# Patient Record
Sex: Female | Born: 1950
Health system: Southern US, Community
[De-identification: ages and names within clinical notes are randomized; demographics above are authoritative.]

## PROBLEM LIST (undated history)

## (undated) DIAGNOSIS — F329 Major depressive disorder, single episode, unspecified: Secondary | ICD-10-CM

## (undated) DIAGNOSIS — F419 Anxiety disorder, unspecified: Secondary | ICD-10-CM

## (undated) DIAGNOSIS — N63 Unspecified lump in unspecified breast: Principal | ICD-10-CM

## (undated) DIAGNOSIS — F32A Depression, unspecified: Secondary | ICD-10-CM

## (undated) DIAGNOSIS — L570 Actinic keratosis: Secondary | ICD-10-CM

## (undated) DIAGNOSIS — K219 Gastro-esophageal reflux disease without esophagitis: Secondary | ICD-10-CM

## (undated) DIAGNOSIS — M8580 Other specified disorders of bone density and structure, unspecified site: Secondary | ICD-10-CM

## (undated) DIAGNOSIS — T7840XA Allergy, unspecified, initial encounter: Secondary | ICD-10-CM

## (undated) DIAGNOSIS — I10 Essential (primary) hypertension: Secondary | ICD-10-CM

## (undated) HISTORY — DX: Other specified disorders of bone density and structure, unspecified site: M85.80

## (undated) HISTORY — DX: Anxiety disorder, unspecified: F41.9

## (undated) HISTORY — PX: OTHER SURGICAL HISTORY: SHX169

## (undated) HISTORY — DX: Actinic keratosis: L57.0

## (undated) HISTORY — DX: Essential (primary) hypertension: I10

## (undated) HISTORY — DX: Depression, unspecified: F32.A

## (undated) HISTORY — DX: Gastro-esophageal reflux disease without esophagitis: K21.9

## (undated) HISTORY — DX: Major depressive disorder, single episode, unspecified: F32.9

## (undated) HISTORY — DX: Allergy, unspecified, initial encounter: T78.40XA

---
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
## 1986-08-18 HISTORY — PX: ABDOMINAL HYSTERECTOMY: SHX81

## 1998-06-27 ENCOUNTER — Other Ambulatory Visit: Admission: RE | Admit: 1998-06-27 | Discharge: 1998-06-27 | Payer: Self-pay | Admitting: Obstetrics and Gynecology

## 1998-09-06 ENCOUNTER — Other Ambulatory Visit: Admission: RE | Admit: 1998-09-06 | Discharge: 1998-09-06 | Payer: Self-pay | Admitting: Obstetrics and Gynecology

## 1999-08-09 ENCOUNTER — Other Ambulatory Visit: Admission: RE | Admit: 1999-08-09 | Discharge: 1999-08-09 | Payer: Self-pay | Admitting: Obstetrics and Gynecology

## 1999-09-24 ENCOUNTER — Encounter (INDEPENDENT_AMBULATORY_CARE_PROVIDER_SITE_OTHER): Payer: Self-pay | Admitting: Specialist

## 1999-09-24 ENCOUNTER — Other Ambulatory Visit: Admission: RE | Admit: 1999-09-24 | Discharge: 1999-09-24 | Payer: Self-pay | Admitting: Obstetrics and Gynecology

## 2000-05-21 ENCOUNTER — Other Ambulatory Visit: Admission: RE | Admit: 2000-05-21 | Discharge: 2000-05-21 | Payer: Self-pay | Admitting: Obstetrics and Gynecology

## 2000-11-12 ENCOUNTER — Other Ambulatory Visit: Admission: RE | Admit: 2000-11-12 | Discharge: 2000-11-12 | Payer: Self-pay | Admitting: Obstetrics and Gynecology

## 2001-06-10 ENCOUNTER — Other Ambulatory Visit: Admission: RE | Admit: 2001-06-10 | Discharge: 2001-06-10 | Payer: Self-pay | Admitting: Obstetrics and Gynecology

## 2001-08-18 HISTORY — PX: CHOLECYSTECTOMY: SHX55

## 2001-11-23 ENCOUNTER — Other Ambulatory Visit: Admission: RE | Admit: 2001-11-23 | Discharge: 2001-11-23 | Payer: Self-pay | Admitting: Obstetrics and Gynecology

## 2002-12-05 ENCOUNTER — Other Ambulatory Visit: Admission: RE | Admit: 2002-12-05 | Discharge: 2002-12-05 | Payer: Self-pay | Admitting: Obstetrics and Gynecology

## 2003-04-28 ENCOUNTER — Ambulatory Visit (HOSPITAL_COMMUNITY): Admission: RE | Admit: 2003-04-28 | Discharge: 2003-04-28 | Payer: Self-pay | Admitting: Internal Medicine

## 2003-04-28 ENCOUNTER — Encounter (INDEPENDENT_AMBULATORY_CARE_PROVIDER_SITE_OTHER): Payer: Self-pay | Admitting: Specialist

## 2003-07-10 ENCOUNTER — Other Ambulatory Visit: Admission: RE | Admit: 2003-07-10 | Discharge: 2003-07-10 | Payer: Self-pay | Admitting: Obstetrics and Gynecology

## 2003-12-28 ENCOUNTER — Other Ambulatory Visit: Admission: RE | Admit: 2003-12-28 | Discharge: 2003-12-28 | Payer: Self-pay | Admitting: Obstetrics and Gynecology

## 2004-06-27 ENCOUNTER — Other Ambulatory Visit: Admission: RE | Admit: 2004-06-27 | Discharge: 2004-06-27 | Payer: Self-pay | Admitting: Obstetrics and Gynecology

## 2004-09-24 ENCOUNTER — Emergency Department: Payer: Self-pay | Admitting: Emergency Medicine

## 2004-10-28 ENCOUNTER — Ambulatory Visit: Payer: Self-pay | Admitting: Internal Medicine

## 2004-10-30 ENCOUNTER — Encounter: Admission: RE | Admit: 2004-10-30 | Discharge: 2004-10-30 | Payer: Self-pay | Admitting: Internal Medicine

## 2005-04-23 ENCOUNTER — Other Ambulatory Visit: Admission: RE | Admit: 2005-04-23 | Discharge: 2005-04-23 | Payer: Self-pay | Admitting: Obstetrics and Gynecology

## 2005-08-18 HISTORY — PX: BLADDER REPAIR: SHX76

## 2005-11-11 ENCOUNTER — Other Ambulatory Visit: Admission: RE | Admit: 2005-11-11 | Discharge: 2005-11-11 | Payer: Self-pay | Admitting: Obstetrics and Gynecology

## 2006-03-05 ENCOUNTER — Ambulatory Visit (HOSPITAL_COMMUNITY): Admission: RE | Admit: 2006-03-05 | Discharge: 2006-03-06 | Payer: Self-pay | Admitting: Obstetrics and Gynecology

## 2009-04-02 ENCOUNTER — Ambulatory Visit: Payer: Self-pay | Admitting: Internal Medicine

## 2009-04-16 ENCOUNTER — Ambulatory Visit: Payer: Self-pay | Admitting: Internal Medicine

## 2010-08-18 HISTORY — PX: KNEE SURGERY: SHX244

## 2011-01-03 NOTE — Op Note (Signed)
   NAMESHEREDA, GRAW                           ACCOUNT NO.:  [id number]   MEDICAL RECORD NO.:  [id number]                   PATIENT TYPE:  AMB   LOCATION:  ENDO                                 FACILITY:  [facility]   PHYSICIAN:  Lina Sar, M.D. [facility]               DATE OF BIRTH:  1950-10-29   DATE OF PROCEDURE:  04/28/2003  DATE OF DISCHARGE:                                 OPERATIVE REPORT   PROCEDURE:  Colonoscopy.   INDICATIONS:  This 60 year old white female was found to have hemoccult  positive stool on exam by Dr. Henderson Cloud.  I saw her in the office on April 25, 2003 and she was at that time hemoccult negative.  She has a positive  family history of colon cancer in an indirect relative.  She has had  irregular bowel habits, mostly constipation.  She has had some unexplained  weight gain.  Her medications have included hormonal replacement and Lexapro  10 mg a day.  She is undergoing colonoscopy for evaluation of hemoccult  positive stool.   ENDOSCOPE:  Olympus single-channel video endoscope.   SEDATION:  Versed 7 mg IV, fentanyl  100 mcg IV.   FINDINGS:  Olympus single-channel videoscope passed into the rectum and  advanced to the sigmoid colon.  The patient was monitored by pulse oximetry.  Her oxygen saturations were normal.  Her prep was excellent.  Anal canal and  rectal ampulla were normal.  At the level of 20 cm from the rectum was  sessile polyp with a wide base, about 1 cm in diameter and it showed no  stigmata of bleeding.  It was snared with a mini snare and sent to pathology  in one piece.  There was no bleeding from post polypectomy site.  The  descending colon, splenic flexure, transverse colon, hepatic flexure  appeared entirely normal.  Normal ascending colon, cecum, cecal pouch,  ileocecal valve were visualized thoroughly and appears normal.  Colonoscope  was then retracted, the colon decompressed. The patient tolerated the  procedure well.   IMPRESSION:   Left colon polyp, status post polypectomy.   PLAN:  1. Await results of the biopsies of polyps.  2. Post polypectomy instructions.  3. Hemoccult cards yearly.  4. Recall colonoscopy depending on the polyp histology, probably in three to     five years.      DB/MEDQ  D:  04/28/2003  T:  04/28/2003  Job:  [id number]   cc:   Guy Sandifer. Arleta Creek, M.D.  [address]  [place]  [place] [address]  Fax: [phone]

## 2011-01-03 NOTE — Discharge Summary (Signed)
[name], [name]               ACCOUNT NO.:  [id number]   MEDICAL RECORD NO.:  [id number]          PATIENT TYPE:  OIB   LOCATION:  9303                          FACILITY:  [facility]   PHYSICIAN:  Guy Sandifer. Henderson Cloud, M.D. DATE OF BIRTH:  [birth year]   DATE OF ADMISSION:  03/05/2006  DATE OF DISCHARGE:                                 DISCHARGE SUMMARY   ADMITTING DIAGNOSIS:  Stress urinary continence and pelvic relaxation.   DISCHARGE DIAGNOSIS:  Stress urinary continence and pelvic relaxation.   PROCEDURE:  On March 05, 2006, anterior/posterior vaginal repair with an  anterior bovine graft and Lynx midurethral sling.   REASON FOR ADMISSION:  This patient is a 60 year old married white female  G2, P2 status post TAH/BSO for endometriosis with increasing symptoms of  relaxation and incontinence.  Details are dictated in the history and  physical.  She is admitted for surgical management.   HOSPITAL COURSE:  The patient is admitted to the hospital, undergoes the  above procedure.  On the evening of surgery she has good pain relief, is  ambulating, without nausea or vomiting.  Vital signs are stable, she is  afebrile, with clear urine output.  On the morning of discharge Foley  catheter has been removed and she avoids 200 mL with a 37 mL residual.  Vital signs are stable, she is afebrile.  Vaginal pack is removed and there  is no bleeding.  Hemoglobin 12.0; white count 16.1; platelets 213,000.  Pathology is pending.   CONDITION ON DISCHARGE:  Good.   DIET:  Regular as tolerated.   ACTIVITY:  No lifting, no operation of automobiles, no vaginal entry.  She  is to call the office for problems including but not limited to heavy  vaginal bleeding, persistent nausea/vomiting, increasing pain, or  temperature of 101 degrees.  Double-voiding is discussed.   MEDICATIONS:  1.  She will resume Lexapro.  2.  Will resume the Vivelle-Dot next week.  3.  Percocet 5/325 mg #40 one to two p.o. q.6h.  p.r.n.  4.  Ibuprofen 600 mg q.6h. p.r.n.   FOLLOWUP:  Is in the office in 2 weeks.     Guy Sandifer Henderson Cloud, M.D.  Electronically Signed    JET/MEDQ  D:  03/06/2006  T:  03/06/2006  Job:  [id number]

## 2011-01-03 NOTE — Op Note (Signed)
[name], [name]               ACCOUNT NO.:  [id number]   MEDICAL RECORD NO.:  [id number]          PATIENT TYPE:  AMB   LOCATION:  SDC                           FACILITY:  [facility]   PHYSICIAN:  Guy Sandifer. Henderson Cloud, M.D. DATE OF BIRTH:  Oct 22, 1950   DATE OF PROCEDURE:  03/05/2006  DATE OF DISCHARGE:                                 OPERATIVE REPORT   PREOPERATIVE DIAGNOSES:  1.  Stress urinary continence  2.  Pelvic relaxation.   POSTOPERATIVE DIAGNOSES:  1.  Stress urinary continence  2.  Pelvic relaxation.   PROCEDURE:  Anterior vaginal repair with Boston scientific graft, posterior  vaginal repair, and Lynx mid urethral sling.   SURGEON:  Harold Hedge, M.D.   ASSISTANT:  Juluis Mire, M.D.   ANESTHESIA:  General with endotracheal intubation.   ESTIMATED BLOOD LOSS:  Less than 100 mL.   INDICATIONS AND CONSENT:  This patient is a 60 year old married white  female, G2, P2, status post TAH-BSO with increasing symptoms of stress  incontinence and pelvic relaxation.  Details have been dictated in the  history and physical.  Anterior-posterior repair with grafts and mid  urethral sling have been discussed preoperatively.  Potential risks and  complications were discussed preoperatively including but limited to  infection, organ damage, bleeding requiring transfusion of blood products  with possible transfusion reaction, HIV and hepatitis acquisition, DVT, PE,  pneumonia, fistula formation, erosion, postoperative pain, dyspareunia,  vaginal narrowing, urinary retention, prolonged or intermittent self-  catheterization and possible return to the OR.  All questions have been  answered and consent was signed on the chart.   PROCEDURE:  The patient is taken to operating room where she is identified,  placed in the dorsosupine position, and general anesthesia is induced via  endotracheal intubation.  She was then placed in dorsal lithotomy position  where she is prepped, bladder  straight catheterized, and she is draped in a  sterile fashion.  Weighted speculum was placed, and the anterior vaginal  mucosa is injected with 1/2% lidocaine with 1:200,000 epinephrine.  Starting  with an inverted T incision at the anterior vaginal apex, the anterior  vaginal mucosa was taken down in the midline to a point approximately 2 cm  below the urethral meatus.  Bilateral dissection is then done sharply and  bluntly.  Zero Ethibond suture was then placed through the uterosacral  ligaments bilaterally proximally 2 fingerbreadths medial to the spine using  the Capio needle driver.  A second suture was placed bilaterally along the  white line using the same Ethibond suture.  Then, a [employer] scientific bovine  graft was trimmed to fit, and the 2 posterior anchoring sutures were placed  on the posterior corners of the graft and it was tied down.  The remaining 2  sutures were placed through the margins of the graft, and those were also  tied down.  This achieved good fixation and support.  Suprapubically, 2  incisions were made just one fingerbreadth lateral to the midline  bilaterally after injecting with 1/2% lidocaine with 1:200,000 epinephrine.  Foley catheter was placed in  the bladder.  Then, using the [employer], the  needles were placed from the top down retropubically bilaterally.  Foley  catheter was then removed, and cystoscopy was carried out.  There was no  evidence of perforation, foreign body bilaterally.  The good puff from the  ureters was noted bilaterally.  The cystoscope was removed, and a Foley  catheter was replaced.  The [place] mid urethral sling was then placed on the  needles and withdrawn back up through the incision.  The sheaths were  removed, and a sling was positioned with approximately a 0.5 cm of space  below the urethra.  Minimal amount of mucosa was then trimmed bilaterally,  and the anterior vaginal mucosa was closed in a running locking fashion with  2-0  Monocryl suture.  The posterior vaginal mucosa was then injected with  the same solution of lidocaine and epinephrine.  Small diamond shaped wedge  of tissue was removed from the posterior perineal body, and the posterior  mucosa was taken down in the midline.  This was then carried out bilaterally  sharply and bluntly.  The rectovaginal fascia was then reapproximated  midline with interrupted 0 Monocryl suture.  This achieves good support.  Excess mucosa was trimmed, and the mucosa was reapproximated in a running  locking fashion with 2-0 Monocryl suture down to the level perineal of the  body.  Body was then reapproximated with interrupted 0 Monocryl sutures, and  2-0 suture was then used to complete the closure of the mucosa and the skin  in an episiotomy type fashion.  Urine continues to be clear.  A 1-inch  vaginal pack with estrogen cream was placed in the vagina.  All counts  correct.  The patient was awakened and taken to the recovery room in stable  condition.      Guy Sandifer Henderson Cloud, M.D.  Electronically Signed     JET/MEDQ  D:  03/05/2006  T:  03/05/2006  Job:  [id number]

## 2011-01-03 NOTE — H&P (Signed)
[name], [name]               ACCOUNT NO.:  [id number]   MEDICAL RECORD NO.:  [id number]          PATIENT TYPE:  AMB   LOCATION:  SDC                           FACILITY:  [facility]   PHYSICIAN:  Guy Sandifer. Henderson Cloud, M.D. DATE OF BIRTH:  [birth year]   DATE OF ADMISSION:  03/05/2006  DATE OF DISCHARGE:                                HISTORY & PHYSICAL   CHIEF COMPLAINT:  Leaking urine.   HISTORY OF PRESENT ILLNESS:  This patient is a 60 year old, married, white  female, G2 P2, status post TAH-BSO for endometriosis who has increasingly  symptomatic leaking of urine with activity such as playing golf, coughing,  etcetera.  Urodynamic studies are consistent with stress urinary  incontinence as well.  In addition, she has to sometimes splint to achieve a  bowel movement.  After careful consideration of options, she is being  admitted for anterior posterior vaginal repair probably with grafts as well  as a mid urethralsling.  Potential risks and complications have been  discussed with the patient preoperatively.   PAST MEDICAL HISTORY:  1.  Endometriosis.  2.  Depression.   PAST SURGICAL HISTORY:  TAH-BSO.   OBSTETRIC HISTORY:  Vaginal delivery x2.   FAMILY HISTORY:  Cervical cancer in mother.  Lungs cancer in father.  Chronic hypertension in father.   MEDICATIONS:  Lexapro and Vivelle-Dot.   NO KNOWN DRUG ALLERGIES.   SOCIAL HISTORY:  Denies tobacco, alcohol, and drug abuse.   REVIEW OF SYSTEMS:  NEURO:  Denies headache.  CARDIO:  Denies chest pain.  PULMONARY:  Denies shortness of breath.  GI:  Denies recent changes in bowel  habits.   PHYSICAL EXAMINATION:  VITAL SIGNS:  Height 5 feet 3 and 1/4 inches, weight  169 pounds, blood pressure 126/78.  HEENT:  Without thyromegaly.  LUNGS:  Clear to auscultation.  HEART:  Regular rate and rhythm.  BACK:  Without CVA tenderness.  BREASTS:  Without mass, tracks, discharge.  ABDOMEN:  Soft nontender without masses.  PELVIC:  Vulva  vagina without lesion.  First degree cystocele is noted.  There is no urethral hypermobility.  A second degree rectocele is noted.  Adequate sphincter tone is noted.  EXTREMITIES:  Trace edema.  NEUROLOGIC:  Deep tendon reflexes 2+ and equal.   ASSESSMENT:  1.  Stress urinary incontinence.  2.  Pelvic relaxation.   PLAN:  Anterior/posterior repair with probable grafts and mid urethral  sling.      Guy Sandifer Henderson Cloud, M.D.  Electronically Signed     JET/MEDQ  D:  03/04/2006  T:  03/04/2006  Job:  [id number]

## 2011-01-20 DIAGNOSIS — M23305 Other meniscus derangements, unspecified medial meniscus, unspecified knee: Secondary | ICD-10-CM | POA: Insufficient documentation

## 2011-01-24 DIAGNOSIS — M23302 Other meniscus derangements, unspecified lateral meniscus, unspecified knee: Secondary | ICD-10-CM | POA: Insufficient documentation

## 2014-05-01 ENCOUNTER — Encounter: Payer: Self-pay | Admitting: Internal Medicine

## 2014-09-20 ENCOUNTER — Other Ambulatory Visit: Payer: Self-pay | Admitting: Obstetrics and Gynecology

## 2014-09-21 LAB — CYTOLOGY - PAP

## 2015-01-23 ENCOUNTER — Ambulatory Visit (INDEPENDENT_AMBULATORY_CARE_PROVIDER_SITE_OTHER): Payer: Managed Care, Other (non HMO) | Admitting: Family Medicine

## 2015-01-23 ENCOUNTER — Encounter: Payer: Self-pay | Admitting: Family Medicine

## 2015-01-23 VITALS — BP 120/70 | HR 60 | Temp 97.7°F | Resp 16 | Ht 62.5 in | Wt 153.0 lb

## 2015-01-23 DIAGNOSIS — F419 Anxiety disorder, unspecified: Secondary | ICD-10-CM | POA: Insufficient documentation

## 2015-01-23 DIAGNOSIS — J029 Acute pharyngitis, unspecified: Secondary | ICD-10-CM | POA: Insufficient documentation

## 2015-01-23 DIAGNOSIS — F32A Depression, unspecified: Secondary | ICD-10-CM | POA: Insufficient documentation

## 2015-01-23 DIAGNOSIS — J302 Other seasonal allergic rhinitis: Secondary | ICD-10-CM | POA: Insufficient documentation

## 2015-01-23 DIAGNOSIS — R938 Abnormal findings on diagnostic imaging of other specified body structures: Secondary | ICD-10-CM | POA: Diagnosis not present

## 2015-01-23 DIAGNOSIS — F329 Major depressive disorder, single episode, unspecified: Secondary | ICD-10-CM | POA: Insufficient documentation

## 2015-01-23 DIAGNOSIS — I10 Essential (primary) hypertension: Secondary | ICD-10-CM | POA: Insufficient documentation

## 2015-01-23 DIAGNOSIS — F338 Other recurrent depressive disorders: Secondary | ICD-10-CM | POA: Insufficient documentation

## 2015-01-23 DIAGNOSIS — R9389 Abnormal findings on diagnostic imaging of other specified body structures: Secondary | ICD-10-CM | POA: Insufficient documentation

## 2015-01-23 MED ORDER — FLUTICASONE PROPIONATE 50 MCG/ACT NA SUSP: 2.0000 | Freq: Every day | NASAL | Status: DC

## 2015-01-23 NOTE — Progress Notes (Signed)
   Subjective:    Patient ID: [name], female    DOB: 09-17-1950, 63 y.o.   MRN: [id number]  URI  This is a new problem. The current episode started in the past 7 days. The problem has been gradually improving. There has been no fever. Associated symptoms include congestion, coughing, a plugged ear sensation, sinus pain, sneezing and wheezing. Pertinent negatives include no ear pain, headaches or sore throat. She has tried nothing for the symptoms.    Past Medical History  Diagnosis Date  . Allergy   . GERD (gastroesophageal reflux disease)   . Anxiety   . Depression    Past Surgical History  Procedure Laterality Date  . Abdominal hysterectomy  1988    complete; due to fibroids  . Tonsillectomy  1960  . Cholecystectomy  2003  . Bladder repair  2007  . Knee surgery Left 2012    reports that she has never smoked. She has never used smokeless tobacco. She reports that she does not drink alcohol or use illicit drugs. family history includes COPD in her father and mother; Cervical cancer in her mother; Hypertension in her father. No Known Allergies    Review of Systems  HENT: Positive for congestion and sneezing. Negative for ear pain and sore throat.   Respiratory: Positive for cough and wheezing.   Neurological: Negative for headaches.      Objective:   Physical Exam  Constitutional: She is oriented to person, place, and time. She appears well-developed and well-nourished.  HENT:  Head: Normocephalic and atraumatic.  Right Ear: External ear normal.  Left Ear: External ear normal.  Mouth/Throat: Oropharynx is clear and moist.  Eyes: Conjunctivae and EOM are normal. Pupils are equal, round, and reactive to light.  Neck: Normal range of motion. Neck supple.  Cardiovascular: Normal rate and regular rhythm.   Pulmonary/Chest: Effort normal and breath sounds normal.  Neurological: She is alert and oriented to person, place, and time.  Psychiatric: She has a normal mood  and affect. Her behavior is normal. Judgment and thought content normal.     BP 120/70 mmHg  Pulse 60  Temp(Src) 97.7 F (36.5 C) (Oral)  Resp 16  Ht 5' 2.5" (1.588 m)  Wt 153 lb (69.4 kg)  BMI 27.52 kg/m2  SpO2 98%       Assessment & Plan:    1. Allergic rhinitis, seasonal  Condition is worsening. Will start medication for better control.    Please call back if condition worsens or does not continue to improve.    - fluticasone (FLONASE) 50 MCG/ACT nasal spray; Place 2 sprays into both nostrils daily.  Dispense: 16 g; Refill: 6  2. Abnormal chest x-ray With cough and history of abnormal CXR in the past, will recheck.  - DG Chest 2 View; Future

## 2015-01-24 ENCOUNTER — Ambulatory Visit
Admission: RE | Admit: 2015-01-24 | Discharge: 2015-01-24 | Disposition: A | Discharge status: Home / Self Care | Payer: Managed Care, Other (non HMO) | Source: Ambulatory Visit | Attending: Family Medicine | Admitting: Family Medicine

## 2015-01-24 ENCOUNTER — Telehealth: Payer: Self-pay

## 2015-01-24 DIAGNOSIS — R05 Cough: Secondary | ICD-10-CM | POA: Insufficient documentation

## 2015-01-24 DIAGNOSIS — R9389 Abnormal findings on diagnostic imaging of other specified body structures: Secondary | ICD-10-CM

## 2015-01-24 DIAGNOSIS — R938 Abnormal findings on diagnostic imaging of other specified body structures: Secondary | ICD-10-CM | POA: Diagnosis not present

## 2015-01-24 NOTE — Telephone Encounter (Signed)
Pt advised as directed below.   Thanks,   -Laura  

## 2015-01-24 NOTE — Telephone Encounter (Signed)
-----   Message from Margarita Rana, MD sent at 01/24/2015  1:25 PM EDT ----- Chest Xray show mild hyperinflation, which can be from bronchitis, reactive airway disease, emphysema or just a very deep breath.   No scarring or nodules noted.  Please notify patient. Thanks.

## 2015-02-01 ENCOUNTER — Other Ambulatory Visit: Payer: Self-pay

## 2015-02-01 DIAGNOSIS — F419 Anxiety disorder, unspecified: Secondary | ICD-10-CM

## 2015-02-01 DIAGNOSIS — I10 Essential (primary) hypertension: Secondary | ICD-10-CM

## 2015-02-01 MED ORDER — ESCITALOPRAM OXALATE 10 MG PO TABS: 10.0000 mg | ORAL_TABLET | Freq: Every day | ORAL | Status: DC

## 2015-02-01 MED ORDER — AMLODIPINE BESY-BENAZEPRIL HCL 5-10 MG PO CAPS: 1.0000 | ORAL_CAPSULE | Freq: Every day | ORAL | Status: DC

## 2015-02-05 ENCOUNTER — Telehealth: Payer: Self-pay | Admitting: Family Medicine

## 2015-02-05 DIAGNOSIS — I10 Essential (primary) hypertension: Secondary | ICD-10-CM

## 2015-02-05 DIAGNOSIS — F419 Anxiety disorder, unspecified: Secondary | ICD-10-CM

## 2015-02-05 MED ORDER — ESCITALOPRAM OXALATE 10 MG PO TABS: 10.0000 mg | ORAL_TABLET | Freq: Every day | ORAL | Status: DC

## 2015-02-05 MED ORDER — AMLODIPINE BESY-BENAZEPRIL HCL 5-10 MG PO CAPS: 1.0000 | ORAL_CAPSULE | Freq: Every day | ORAL | Status: DC

## 2015-02-05 NOTE — Telephone Encounter (Signed)
This is Dr. Sharyon Medicus patient, but she is out of the office this week. [facility] in [place] added in chart.

## 2015-02-05 NOTE — Telephone Encounter (Signed)
Pt states she is on vacation at [facility] and her bag that had her medication was left at home.  Pt is request a 10 day supply of Rx amLODipine-benazepril (LOTREL) 5-10 MG 1 pill per day and escitalopram (LEXAPRO) 10 MG tablet 1 pill a day if possible.  [facility] [place]  [address].  Phone [phone].  CS#[phone]

## 2015-02-05 NOTE — Telephone Encounter (Signed)
Please call in these medications to the pharmacy in [place] as listed if they will accept them across state lines. Can't seem to find this pharmacy in the data base.

## 2015-02-06 MED ORDER — AMLODIPINE BESY-BENAZEPRIL HCL 5-10 MG PO CAPS: 1.0000 | ORAL_CAPSULE | Freq: Every day | ORAL | Status: DC

## 2015-02-06 MED ORDER — ESCITALOPRAM OXALATE 10 MG PO TABS: 10.0000 mg | ORAL_TABLET | Freq: Every day | ORAL | Status: DC

## 2015-02-06 NOTE — Telephone Encounter (Signed)
[facility] [place] [facility]'s record. RX e scribed to pharmacy. Patient is aware.

## 2015-03-26 ENCOUNTER — Other Ambulatory Visit: Payer: Self-pay | Admitting: Obstetrics and Gynecology

## 2015-03-27 LAB — CYTOLOGY - PAP

## 2015-09-24 DIAGNOSIS — Z01419 Encounter for gynecological examination (general) (routine) without abnormal findings: Secondary | ICD-10-CM | POA: Diagnosis not present

## 2015-09-24 DIAGNOSIS — Z6829 Body mass index (BMI) 29.0-29.9, adult: Secondary | ICD-10-CM | POA: Diagnosis not present

## 2015-09-24 DIAGNOSIS — Z1231 Encounter for screening mammogram for malignant neoplasm of breast: Secondary | ICD-10-CM | POA: Diagnosis not present

## 2015-10-08 ENCOUNTER — Other Ambulatory Visit: Payer: Self-pay | Admitting: Obstetrics and Gynecology

## 2015-10-08 DIAGNOSIS — N63 Unspecified lump in unspecified breast: Secondary | ICD-10-CM

## 2015-10-11 ENCOUNTER — Other Ambulatory Visit: Payer: Managed Care, Other (non HMO)

## 2015-10-16 ENCOUNTER — Encounter: Payer: Self-pay | Admitting: Physician Assistant

## 2015-10-16 ENCOUNTER — Ambulatory Visit (INDEPENDENT_AMBULATORY_CARE_PROVIDER_SITE_OTHER): Payer: Managed Care, Other (non HMO) | Admitting: Physician Assistant

## 2015-10-16 VITALS — BP 112/60 | HR 64 | Temp 97.6°F | Resp 16 | Wt 162.6 lb

## 2015-10-16 DIAGNOSIS — J302 Other seasonal allergic rhinitis: Secondary | ICD-10-CM | POA: Diagnosis not present

## 2015-10-16 MED ORDER — FLUTICASONE PROPIONATE 50 MCG/ACT NA SUSP: 2.0000 | Freq: Every day | NASAL | Status: DC

## 2015-10-16 NOTE — Patient Instructions (Signed)
Hay Fever Hay fever is an allergic reaction to particles in the air. It cannot be passed from person to person. It cannot be cured, but it can be controlled. CAUSES  Hay fever is caused by something that triggers an allergic reaction (allergens). The following are examples of allergens:  Ragweed.  Feathers.  Animal dander.  Grass and tree pollens.  Cigarette smoke.  House dust.  Pollution. SYMPTOMS   Sneezing.  Runny or stuffy nose.  Tearing eyes.  Itchy eyes, nose, mouth, throat, skin, or other area.  Sore throat.  Headache.  Decreased sense of smell or taste. DIAGNOSIS Your caregiver will perform a physical exam and ask questions about the symptoms you are having.Allergy testing may be done to determine exactly what triggers your hay fever.  TREATMENT   Over-the-counter medicines may help symptoms. These include:  Antihistamines.  Decongestants. These may help with nasal congestion.  Your caregiver may prescribe medicines if over-the-counter medicines do not work.  Some people benefit from allergy shots when other medicines are not helpful. HOME CARE INSTRUCTIONS   Avoid the allergen that is causing your symptoms, if possible.  Take all medicine as told by your caregiver. SEEK MEDICAL CARE IF:   You have severe allergy symptoms and your current medicines are not helping.  Your treatment was working at one time, but you are now experiencing symptoms.  You have sinus congestion and pressure.  You develop a fever or headache.  You have thick nasal discharge.  You have asthma and have a worsening cough and wheezing. SEEK IMMEDIATE MEDICAL CARE IF:   You have swelling of your tongue or lips.  You have trouble breathing.  You feel lightheaded or like you are going to faint.  You have cold sweats.  You have a fever.   This information is not intended to replace advice given to you by your health care provider. Make sure you discuss any  questions you have with your health care provider.   Document Released: 08/04/2005 Document Revised: 10/27/2011 Document Reviewed: 02/14/2015 Elsevier Interactive Patient Education 2016 Elsevier Inc[employer]  

## 2015-10-16 NOTE — Progress Notes (Signed)
Patient: [name] Female    DOB: Aug 07, 1951   65 y.o.   MRN: [id number] Visit Date: 10/16/2015  Today's Provider: Mar Daring, PA-C   Chief Complaint  Patient presents with  . Sinusitis   Subjective:    Sinusitis This is a new problem. The current episode started in the past 7 days (Started on Friday). The problem is unchanged. There has been no fever. Associated symptoms include congestion, coughing, sneezing and a sore throat (scratchy throat only at night). Pertinent negatives include no chills, ear pain, headaches, shortness of breath, sinus pressure or swollen glands. Past treatments include nothing. The treatment provided no relief.  Neighbors have been cutting down pine tress on Friday and that was when symptoms started.     No Known Allergies Previous Medications   ALPRAZOLAM (XANAX) 0.5 MG TABLET    Take by mouth. Reported on 10/16/2015   AMLODIPINE-BENAZEPRIL (LOTREL) 5-10 MG PER CAPSULE    Take 1 capsule by mouth daily.   ESCITALOPRAM (LEXAPRO) 10 MG TABLET    Take 1 tablet (10 mg total) by mouth daily.   FLUTICASONE (FLONASE) 50 MCG/ACT NASAL SPRAY    Place 2 sprays into both nostrils daily.   MULTIPLE VITAMIN PO    Take by mouth.   OMEPRAZOLE 20 MG TBEC    Take by mouth. Reported on 10/16/2015    Review of Systems  Constitutional: Negative for fever and chills.  HENT: Positive for congestion, sneezing and sore throat (scratchy throat only at night). Negative for ear pain and sinus pressure.   Respiratory: Positive for cough. Negative for chest tightness, shortness of breath and wheezing.   Cardiovascular: Negative for chest pain, palpitations and leg swelling.  Gastrointestinal: Negative for nausea, vomiting and abdominal pain.  Neurological: Negative for dizziness, light-headedness and headaches.    Social History  Substance Use Topics  . Smoking status: Never Smoker   . Smokeless tobacco: Never Used  . Alcohol Use: No   Objective:   BP  112/60 mmHg  Pulse 64  Temp(Src) 97.6 F (36.4 C) (Oral)  Resp 16  Wt 162 lb 9.6 oz (73.755 kg)  SpO2 98%  Physical Exam  Constitutional: She appears well-developed and well-nourished. No distress.  HENT:  Head: Normocephalic and atraumatic.  Right Ear: Hearing, tympanic membrane, external ear and ear canal normal.  Left Ear: Hearing, tympanic membrane, external ear and ear canal normal.  Nose: Mucosal edema (swollen but pale) and rhinorrhea present. Right sinus exhibits no maxillary sinus tenderness and no frontal sinus tenderness. Left sinus exhibits no maxillary sinus tenderness and no frontal sinus tenderness.  Mouth/Throat: Uvula is midline, oropharynx is clear and moist and mucous membranes are normal. No oropharyngeal exudate, posterior oropharyngeal edema or posterior oropharyngeal erythema.  Eyes: Conjunctivae are normal. Pupils are equal, round, and reactive to light. Right eye exhibits no discharge. Left eye exhibits no discharge. No scleral icterus.  Neck: Normal range of motion. Neck supple. No tracheal deviation present. No thyromegaly present.  Cardiovascular: Normal rate, regular rhythm and normal heart sounds.  Exam reveals no gallop and no friction rub.   No murmur heard. Pulmonary/Chest: Effort normal and breath sounds normal. No stridor. No respiratory distress. She has no wheezes. She has no rales.  Lymphadenopathy:    She has no cervical adenopathy.  Skin: Skin is warm and dry. She is not diaphoretic.  Vitals reviewed.       Assessment & Plan:     1.  Allergic rhinitis, seasonal Will refill flonase as below because she is exhibiting seasonal allergies. Also advised to use antihistamine.  She states she is going to get zyrtec at [facility] to start as well.  She is to call the office if symptoms worsen. - fluticasone (FLONASE) 50 MCG/ACT nasal spray; Place 2 sprays into both nostrils daily.  Dispense: 16 g; Refill: 0       Mar Daring, PA-C  [facility]

## 2016-01-07 DIAGNOSIS — N900 Mild vulvar dysplasia: Secondary | ICD-10-CM | POA: Diagnosis not present

## 2016-01-07 DIAGNOSIS — N890 Mild vaginal dysplasia: Secondary | ICD-10-CM | POA: Diagnosis not present

## 2016-01-29 ENCOUNTER — Other Ambulatory Visit: Payer: Self-pay | Admitting: Family Medicine

## 2016-01-29 DIAGNOSIS — I10 Essential (primary) hypertension: Secondary | ICD-10-CM

## 2016-01-29 DIAGNOSIS — F32A Depression, unspecified: Secondary | ICD-10-CM

## 2016-01-29 DIAGNOSIS — F329 Major depressive disorder, single episode, unspecified: Secondary | ICD-10-CM

## 2016-03-06 ENCOUNTER — Encounter: Payer: Self-pay | Admitting: Gastroenterology

## 2016-09-09 ENCOUNTER — Encounter: Payer: Self-pay | Admitting: Physician Assistant

## 2016-09-09 ENCOUNTER — Ambulatory Visit (INDEPENDENT_AMBULATORY_CARE_PROVIDER_SITE_OTHER): Payer: Managed Care, Other (non HMO) | Admitting: Physician Assistant

## 2016-09-09 VITALS — BP 140/68 | HR 65 | Temp 98.1°F | Resp 16 | Wt 162.0 lb

## 2016-09-09 DIAGNOSIS — J069 Acute upper respiratory infection, unspecified: Secondary | ICD-10-CM

## 2016-09-09 DIAGNOSIS — R05 Cough: Secondary | ICD-10-CM | POA: Diagnosis not present

## 2016-09-09 DIAGNOSIS — R059 Cough, unspecified: Secondary | ICD-10-CM

## 2016-09-09 DIAGNOSIS — F4321 Adjustment disorder with depressed mood: Secondary | ICD-10-CM

## 2016-09-09 DIAGNOSIS — F4320 Adjustment disorder, unspecified: Secondary | ICD-10-CM | POA: Diagnosis not present

## 2016-09-09 DIAGNOSIS — F3341 Major depressive disorder, recurrent, in partial remission: Secondary | ICD-10-CM

## 2016-09-09 DIAGNOSIS — I10 Essential (primary) hypertension: Secondary | ICD-10-CM

## 2016-09-09 MED ORDER — ALPRAZOLAM 0.5 MG PO TABS: 0.5000 mg | ORAL_TABLET | Freq: Two times a day (BID) | ORAL | 5 refills | Status: DC | PRN

## 2016-09-09 MED ORDER — HYDROCOD POLST-CPM POLST ER 10-8 MG/5ML PO SUER: 5.0000 mL | Freq: Two times a day (BID) | ORAL | 0 refills | Status: DC | PRN

## 2016-09-09 MED ORDER — AZITHROMYCIN 250 MG PO TABS: ORAL_TABLET | ORAL | 0 refills | Status: DC

## 2016-09-09 MED ORDER — ALPRAZOLAM 0.5 MG PO TABS
0.5000 mg | ORAL_TABLET | Freq: Two times a day (BID) | ORAL | 0 refills | Status: DC | PRN
Start: 2016-09-09 — End: 2016-09-09

## 2016-09-09 MED ORDER — ESCITALOPRAM OXALATE 10 MG PO TABS: 10.0000 mg | ORAL_TABLET | Freq: Every day | ORAL | 3 refills | Status: DC

## 2016-09-09 MED ORDER — AMLODIPINE BESY-BENAZEPRIL HCL 5-10 MG PO CAPS
1.0000 | ORAL_CAPSULE | Freq: Every day | ORAL | 3 refills | Status: DC
Start: 2016-09-09 — End: 2017-09-28

## 2016-09-09 NOTE — Patient Instructions (Signed)
Complicated Grieving Introduction Grief is a normal response to the death of someone close to you. Feelings of fear, anger, and guilt can affect almost everyone who loses a loved one. It is also common to have symptoms of depression while you are grieving. These include problems with sleep, loss of appetite, and lack of energy. They may last for weeks or months after a loss. Complicated grief is different from normal grief or depression. Normal grieving involves sadness and feelings of loss, but these feelings are not constant. Complicated grief is a constant and severe type of grief. It interferes with your ability to function normally. It may last for several months to a year or longer. Complicated grief may require treatment from a mental health care provider. What are the causes? It is not known why some people continue to struggle with grief and others do not. You may be at higher risk for complicated grief if:  The death of your loved one was sudden or unexpected.  The death of your loved one was due to a violent event.  Your loved one committed suicide.  Your loved one was a child or a young person.  You were very close to or dependent on the loved one.  You have a history of depression. What are the signs or symptoms? Signs and symptoms of complicated grief may include:  Feeling disbelief or numbness.  Being unable to enjoy good memories of your loved one.  Needing to avoid anything that reminds you of your loved one.  Being unable to stop thinking about the death.  Feeling intense anger or guilt.  Feeling alone and hopeless.  Feeling that your life is meaningless and empty.  Losing the desire to live. How is this diagnosed? Your health care provider may diagnose complicated grief if:  You have constant symptoms of grief for 6-12 months or longer.  Your symptoms are interfering with your ability to live your life. Your health care provider may want you to see a  mental health care provider. Many symptoms of depression are similar to the symptoms of complicated grief. It is important to be evaluated for complicated grief along with other mental health conditions. How is this treated? Talk therapy with a mental health provider is the most common treatment for complicated grief. During therapy, you will learn healthy ways to cope with the loss of your loved one. In some cases, your mental health care provider may also recommend antidepressant medicines. Follow these instructions at home:  Take care of yourself.  Eat regular meals and maintain a healthy diet. Eat plenty of fruits, vegetables, and whole grains.  Try to get some exercise each day.  Keep regular hours for sleep. Try to get at least 8 hours of sleep each night.  Do not use drugs or alcohol to ease your symptoms.  Take medicines only as directed by your health care provider.  Spend time with friends and loved ones.  Consider joining a grief (bereavement) support group to help you deal with your loss.  Keep all follow-up visits as directed by your health care provider. This is important. Contact a health care provider if:  Your symptoms keep you from functioning normally.  Your symptoms do not get better with treatment. Get help right away if:  You have serious thoughts of hurting yourself or someone else.  You have suicidal feelings. This information is not intended to replace advice given to you by your health care provider. Make sure you discuss any   questions you have with your health care provider. Document Released: 08/04/2005 Document Revised: 01/10/2016 Document Reviewed: 01/12/2014  2017 Elsevier Upper Respiratory Infection, Adult Most upper respiratory infections (URIs) are a viral infection of the air passages leading to the lungs. A URI affects the nose, throat, and upper air passages. The most common type of URI is nasopharyngitis and is typically referred to as "the  common cold." URIs run their course and usually go away on their own. Most of the time, a URI does not require medical attention, but sometimes a bacterial infection in the upper airways can follow a viral infection. This is called a secondary infection. Sinus and middle ear infections are common types of secondary upper respiratory infections. Bacterial pneumonia can also complicate a URI. A URI can worsen asthma and chronic obstructive pulmonary disease (COPD). Sometimes, these complications can require emergency medical care and may be life threatening. What are the causes? Almost all URIs are caused by viruses. A virus is a type of germ and can spread from one person to another. What increases the risk? You may be at risk for a URI if:  You smoke.  You have chronic heart or lung disease.  You have a weakened defense (immune) system.  You are very young or very old.  You have nasal allergies or asthma.  You work in crowded or poorly ventilated areas.  You work in health care facilities or schools. What are the signs or symptoms? Symptoms typically develop 2-3 days after you come in contact with a cold virus. Most viral URIs last 7-10 days. However, viral URIs from the influenza virus (flu virus) can last 14-18 days and are typically more severe. Symptoms may include:  Runny or stuffy (congested) nose.  Sneezing.  Cough.  Sore throat.  Headache.  Fatigue.  Fever.  Loss of appetite.  Pain in your forehead, behind your eyes, and over your cheekbones (sinus pain).  Muscle aches. How is this diagnosed? Your health care provider may diagnose a URI by:  Physical exam.  Tests to check that your symptoms are not due to another condition such as:  Strep throat.  Sinusitis.  Pneumonia.  Asthma. How is this treated? A URI goes away on its own with time. It cannot be cured with medicines, but medicines may be prescribed or recommended to relieve symptoms. Medicines may  help:  Reduce your fever.  Reduce your cough.  Relieve nasal congestion. Follow these instructions at home:  Take medicines only as directed by your health care provider.  Gargle warm saltwater or take cough drops to comfort your throat as directed by your health care provider.  Use a warm mist humidifier or inhale steam from a shower to increase air moisture. This may make it easier to breathe.  Drink enough fluid to keep your urine clear or pale yellow.  Eat soups and other clear broths and maintain good nutrition.  Rest as needed.  Return to work when your temperature has returned to normal or as your health care provider advises. You may need to stay home longer to avoid infecting others. You can also use a face mask and careful hand washing to prevent spread of the virus.  Increase the usage of your inhaler if you have asthma.  Do not use any tobacco products, including cigarettes, chewing tobacco, or electronic cigarettes. If you need help quitting, ask your health care provider. How is this prevented? The best way to protect yourself from getting a cold is to  practice good hygiene.  Avoid oral or hand contact with people with cold symptoms.  Wash your hands often if contact occurs. There is no clear evidence that vitamin C, vitamin E, echinacea, or exercise reduces the chance of developing a cold. However, it is always recommended to get plenty of rest, exercise, and practice good nutrition. Contact a health care provider if:  You are getting worse rather than better.  Your symptoms are not controlled by medicine.  You have chills.  You have worsening shortness of breath.  You have brown or red mucus.  You have yellow or brown nasal discharge.  You have pain in your face, especially when you bend forward.  You have a fever.  You have swollen neck glands.  You have pain while swallowing.  You have white areas in the back of your throat. Get help right  away if:  You have severe or persistent:  Headache.  Ear pain.  Sinus pain.  Chest pain.  You have chronic lung disease and any of the following:  Wheezing.  Prolonged cough.  Coughing up blood.  A change in your usual mucus.  You have a stiff neck.  You have changes in your:  Vision.  Hearing.  Thinking.  Mood. This information is not intended to replace advice given to you by your health care provider. Make sure you discuss any questions you have with your health care provider. Document Released: 01/28/2001 Document Revised: 04/06/2016 Document Reviewed: 11/09/2013 Elsevier Interactive Patient Education  2017 [employer].

## 2016-09-09 NOTE — Progress Notes (Signed)
Patient: [name] Female    DOB: [birth year]   66 y.o.   MRN: [id number] Visit Date: 09/09/2016  Today's Provider: Mar Daring, PA-C   Chief Complaint  Patient presents with  . Cough   Subjective:    HPI Upper Respiratory Infection: Patient complains of symptoms of a URI. Symptoms include congestion and cough. Onset of symptoms was 5 days ago, gradually improving since that time. She also c/o productive cough with  bloody colored sputum for the past 2 days .  She is drinking plenty of fluids. Evaluation to date: none. Treatment to date: none.  Patient reports that she has been going through a very stressful time. Patient reports that her 47 y/o granddaughter passed away on the 9th due to cancer. Patient is requesting to have her Xanax refilled.    No Known Allergies   Current Outpatient Prescriptions:  .  amLODipine-benazepril (LOTREL) 5-10 MG capsule, TAKE 1 CAPSULE DAILY, Disp: 90 capsule, Rfl: 3 .  escitalopram (LEXAPRO) 10 MG tablet, TAKE 1 TABLET DAILY, Disp: 90 tablet, Rfl: 3 .  ALPRAZolam (XANAX) 0.5 MG tablet, Take by mouth. Reported on 10/16/2015, Disp: , Rfl:   Review of Systems  Constitutional: Positive for diaphoresis.  HENT: Positive for congestion, postnasal drip, rhinorrhea and sinus pressure. Negative for sinus pain, sneezing, sore throat and tinnitus.   Respiratory: Positive for cough. Negative for chest tightness, shortness of breath and wheezing.   Cardiovascular: Negative.   Gastrointestinal: Negative.   Neurological: Negative for dizziness and headaches.  Psychiatric/Behavioral: Positive for agitation. The patient is nervous/anxious.     Social History  Substance Use Topics  . Smoking status: Never Smoker  . Smokeless tobacco: Never Used  . Alcohol use No   Objective:   BP 140/68 (BP Location: Left Arm, Patient Position: Sitting, Cuff Size: Large)   Pulse 65   Temp 98.1 F (36.7 C) (Oral)   Resp 16   Wt 162 lb (73.5 kg)   SpO2  99%   BMI 29.16 kg/m   Physical Exam  Constitutional: She appears well-developed and well-nourished. No distress.  HENT:  Head: Normocephalic and atraumatic.  Right Ear: Hearing, tympanic membrane, external ear and ear canal normal.  Left Ear: Hearing, tympanic membrane, external ear and ear canal normal.  Nose: Mucosal edema and rhinorrhea present. Right sinus exhibits no maxillary sinus tenderness and no frontal sinus tenderness. Left sinus exhibits no maxillary sinus tenderness and no frontal sinus tenderness.  Mouth/Throat: Uvula is midline, oropharynx is clear and moist and mucous membranes are normal. No oropharyngeal exudate, posterior oropharyngeal edema or posterior oropharyngeal erythema.  Eyes: Conjunctivae are normal. Pupils are equal, round, and reactive to light. Right eye exhibits no discharge. Left eye exhibits no discharge. No scleral icterus.  Neck: Normal range of motion. Neck supple. No tracheal deviation present. No thyromegaly present.  Cardiovascular: Normal rate, regular rhythm and normal heart sounds.  Exam reveals no gallop and no friction rub.   No murmur heard. Pulmonary/Chest: Effort normal and breath sounds normal. No stridor. No respiratory distress. She has no wheezes. She has no rales.  Lymphadenopathy:    She has no cervical adenopathy.  Skin: Skin is warm and dry. She is not diaphoretic.  Vitals reviewed.      Assessment & Plan:     1. Upper respiratory tract infection, unspecified type General URI that has not responded to OTC medications. Will give Zpak as below. May continue mucinex dm for congestion.  Tylenol prn for fevers and body aches. She is to call if no improvement. - azithromycin (ZITHROMAX) 250 MG tablet; Take 2 tablets PO on day one, and one tablet PO daily thereafter until completed.  Dispense: 6 tablet; Refill: 0  2. Recurrent major depressive disorder, in partial remission ([place]) Situational secondary to the passing of her only  granddaughter on January 9th secondary to bone cancer. She has been stable on current medications. These were refilled as below.   - escitalopram (LEXAPRO) 10 MG tablet; Take 1 tablet (10 mg total) by mouth daily.  Dispense: 90 tablet; Refill: 3 - ALPRAZolam (XANAX) 0.5 MG tablet; Take 1 tablet (0.5 mg total) by mouth 2 (two) times daily as needed for anxiety. Reported on 10/16/2015  Dispense: 60 tablet; Refill: 5  3. Essential hypertension Stable. Diagnosis pulled for medication refill. Continue current medical treatment plan. - amLODipine-benazepril (LOTREL) 5-10 MG capsule; Take 1 capsule by mouth daily.  Dispense: 90 capsule; Refill: 3  4. Grief reaction Stable. Diagnosis pulled for medication refill. Continue current medical treatment plan. - ALPRAZolam (XANAX) 0.5 MG tablet; Take 1 tablet (0.5 mg total) by mouth 2 (two) times daily as needed for anxiety. Reported on 10/16/2015  Dispense: 60 tablet; Refill: 5  5. Cough Worsening symptoms that has not responded to OTC medications. Will give Tussionex cough syrup as below for nighttime cough. Drowsiness precautions given to patient. Stay well hydrated. Use delsym, robitussin OR mucinex for daytime cough. - chlorpheniramine-HYDROcodone (TUSSIONEX PENNKINETIC ER) 10-8 MG/5ML SUER; Take 5 mLs by mouth every 12 (twelve) hours as needed for cough.  Dispense: 180 mL; Refill: 0       Mar Daring, PA-C  [facility]

## 2016-09-10 DIAGNOSIS — L821 Other seborrheic keratosis: Secondary | ICD-10-CM | POA: Diagnosis not present

## 2016-09-10 DIAGNOSIS — L570 Actinic keratosis: Secondary | ICD-10-CM | POA: Diagnosis not present

## 2016-09-10 DIAGNOSIS — L578 Other skin changes due to chronic exposure to nonionizing radiation: Secondary | ICD-10-CM | POA: Diagnosis not present

## 2016-10-02 DIAGNOSIS — Z6828 Body mass index (BMI) 28.0-28.9, adult: Secondary | ICD-10-CM | POA: Diagnosis not present

## 2016-10-02 DIAGNOSIS — Z779 Other contact with and (suspected) exposures hazardous to health: Secondary | ICD-10-CM | POA: Diagnosis not present

## 2016-10-02 DIAGNOSIS — Z01419 Encounter for gynecological examination (general) (routine) without abnormal findings: Secondary | ICD-10-CM | POA: Diagnosis not present

## 2016-10-02 DIAGNOSIS — Z13820 Encounter for screening for osteoporosis: Secondary | ICD-10-CM | POA: Diagnosis not present

## 2016-10-03 ENCOUNTER — Other Ambulatory Visit: Payer: Self-pay | Admitting: Obstetrics and Gynecology

## 2016-10-03 DIAGNOSIS — N63 Unspecified lump in unspecified breast: Secondary | ICD-10-CM

## 2016-10-07 ENCOUNTER — Ambulatory Visit
Admission: RE | Admit: 2016-10-07 | Discharge: 2016-10-07 | Disposition: A | Discharge status: Home / Self Care | Payer: Managed Care, Other (non HMO) | Source: Ambulatory Visit | Attending: Obstetrics and Gynecology | Admitting: Obstetrics and Gynecology

## 2016-10-07 DIAGNOSIS — N6489 Other specified disorders of breast: Secondary | ICD-10-CM | POA: Diagnosis not present

## 2016-10-07 DIAGNOSIS — N63 Unspecified lump in unspecified breast: Secondary | ICD-10-CM

## 2016-10-07 DIAGNOSIS — R928 Other abnormal and inconclusive findings on diagnostic imaging of breast: Secondary | ICD-10-CM | POA: Diagnosis not present

## 2016-10-07 HISTORY — DX: Unspecified lump in unspecified breast: N63.0

## 2016-11-13 DIAGNOSIS — L570 Actinic keratosis: Secondary | ICD-10-CM | POA: Diagnosis not present

## 2016-11-13 DIAGNOSIS — L821 Other seborrheic keratosis: Secondary | ICD-10-CM | POA: Diagnosis not present

## 2016-11-13 DIAGNOSIS — L578 Other skin changes due to chronic exposure to nonionizing radiation: Secondary | ICD-10-CM | POA: Diagnosis not present

## 2016-11-13 DIAGNOSIS — L812 Freckles: Secondary | ICD-10-CM | POA: Diagnosis not present

## 2016-11-13 DIAGNOSIS — L820 Inflamed seborrheic keratosis: Secondary | ICD-10-CM | POA: Diagnosis not present

## 2017-05-15 DIAGNOSIS — M722 Plantar fascial fibromatosis: Secondary | ICD-10-CM | POA: Diagnosis not present

## 2017-05-20 DIAGNOSIS — L570 Actinic keratosis: Secondary | ICD-10-CM | POA: Diagnosis not present

## 2017-05-20 DIAGNOSIS — L820 Inflamed seborrheic keratosis: Secondary | ICD-10-CM | POA: Diagnosis not present

## 2017-05-20 DIAGNOSIS — L578 Other skin changes due to chronic exposure to nonionizing radiation: Secondary | ICD-10-CM | POA: Diagnosis not present

## 2017-05-20 DIAGNOSIS — L812 Freckles: Secondary | ICD-10-CM | POA: Diagnosis not present

## 2017-05-20 DIAGNOSIS — L981 Factitial dermatitis: Secondary | ICD-10-CM | POA: Diagnosis not present

## 2017-05-20 DIAGNOSIS — L821 Other seborrheic keratosis: Secondary | ICD-10-CM | POA: Diagnosis not present

## 2017-07-23 DIAGNOSIS — L570 Actinic keratosis: Secondary | ICD-10-CM | POA: Diagnosis not present

## 2017-07-23 DIAGNOSIS — R21 Rash and other nonspecific skin eruption: Secondary | ICD-10-CM | POA: Diagnosis not present

## 2017-07-23 DIAGNOSIS — L578 Other skin changes due to chronic exposure to nonionizing radiation: Secondary | ICD-10-CM | POA: Diagnosis not present

## 2017-07-23 DIAGNOSIS — L853 Xerosis cutis: Secondary | ICD-10-CM | POA: Diagnosis not present

## 2017-08-06 DIAGNOSIS — Z79899 Other long term (current) drug therapy: Secondary | ICD-10-CM | POA: Diagnosis not present

## 2017-08-06 DIAGNOSIS — L249 Irritant contact dermatitis, unspecified cause: Secondary | ICD-10-CM | POA: Diagnosis not present

## 2017-08-06 DIAGNOSIS — R21 Rash and other nonspecific skin eruption: Secondary | ICD-10-CM | POA: Diagnosis not present

## 2017-09-09 DIAGNOSIS — L820 Inflamed seborrheic keratosis: Secondary | ICD-10-CM | POA: Diagnosis not present

## 2017-09-09 DIAGNOSIS — L578 Other skin changes due to chronic exposure to nonionizing radiation: Secondary | ICD-10-CM | POA: Diagnosis not present

## 2017-09-09 DIAGNOSIS — L570 Actinic keratosis: Secondary | ICD-10-CM | POA: Diagnosis not present

## 2017-09-09 DIAGNOSIS — R21 Rash and other nonspecific skin eruption: Secondary | ICD-10-CM | POA: Diagnosis not present

## 2017-09-28 ENCOUNTER — Other Ambulatory Visit: Payer: Self-pay | Admitting: Physician Assistant

## 2017-09-28 DIAGNOSIS — F3341 Major depressive disorder, recurrent, in partial remission: Secondary | ICD-10-CM

## 2017-09-28 DIAGNOSIS — I10 Essential (primary) hypertension: Secondary | ICD-10-CM

## 2017-10-21 DIAGNOSIS — Z6827 Body mass index (BMI) 27.0-27.9, adult: Secondary | ICD-10-CM | POA: Diagnosis not present

## 2017-10-21 DIAGNOSIS — R87612 Low grade squamous intraepithelial lesion on cytologic smear of cervix (LGSIL): Secondary | ICD-10-CM | POA: Diagnosis not present

## 2017-10-21 DIAGNOSIS — Z01419 Encounter for gynecological examination (general) (routine) without abnormal findings: Secondary | ICD-10-CM | POA: Diagnosis not present

## 2017-11-12 ENCOUNTER — Ambulatory Visit (INDEPENDENT_AMBULATORY_CARE_PROVIDER_SITE_OTHER): Payer: Managed Care, Other (non HMO) | Admitting: Physician Assistant

## 2017-11-12 ENCOUNTER — Encounter: Payer: Self-pay | Admitting: Physician Assistant

## 2017-11-12 VITALS — BP 130/70 | HR 60 | Temp 97.2°F | Resp 16 | Wt 154.0 lb

## 2017-11-12 DIAGNOSIS — I10 Essential (primary) hypertension: Secondary | ICD-10-CM | POA: Diagnosis not present

## 2017-11-12 NOTE — Progress Notes (Signed)
Patient: [name] Female    DOB: 1951/03/24   67 y.o.   MRN: [id number] Visit Date: 11/12/2017  Today's Provider: Mar Daring, PA-C   Chief Complaint  Patient presents with  . Hypertension   Subjective:    HPI  Patient coming in today with c/o about the medication she is taking for her blood pressure. Patient last follow-up appointment for Essential Hypertension was a year ago and was stable. Patient on Amlodipine-benazepril.She is concerned about heart disease because she has been reading articles about it. She wants to know if she can get off from the medication or try something different. She reports that she tries not have so much salt in her diet. She reports exercising. Diet is healthy. She reports that she has been checking her blood pressure off and on and ranging around 140/70. She reports that sometimes she feels dizzy. She had a little episode last week. No swelling,chest pain.     No Known Allergies   Current Outpatient Medications:  .  amLODipine-benazepril (LOTREL) 5-10 MG capsule, TAKE 1 CAPSULE DAILY, Disp: 90 capsule, Rfl: 3 .  escitalopram (LEXAPRO) 10 MG tablet, TAKE 1 TABLET DAILY, Disp: 90 tablet, Rfl: 3 .  ALPRAZolam (XANAX) 0.5 MG tablet, Take 1 tablet (0.5 mg total) by mouth 2 (two) times daily as needed for anxiety. Reported on 10/16/2015 (Patient not taking: Reported on 11/12/2017), Disp: 60 tablet, Rfl: 5  Review of Systems  Constitutional: Negative for fatigue.  Respiratory: Negative for cough, chest tightness and shortness of breath.   Cardiovascular: Negative for chest pain, palpitations and leg swelling.  Gastrointestinal: Negative for abdominal pain.  Neurological: Positive for dizziness (sometimes; feels associated with increased salt intake). Negative for light-headedness and headaches.    Social History   Tobacco Use  . Smoking status: Never Smoker  . Smokeless tobacco: Never Used  Substance Use Topics  . Alcohol use: No    Objective:   BP 130/70 (BP Location: Left Arm, Patient Position: Sitting, Cuff Size: Normal)   Pulse 60   Temp (!) 97.2 F (36.2 C) (Oral)   Resp 16   Wt 154 lb (69.9 kg)   SpO2 97%   BMI 27.72 kg/m    Physical Exam  Constitutional: She appears well-developed and well-nourished. No distress.  Neck: Normal range of motion. Neck supple.  Cardiovascular: Normal rate, regular rhythm and normal heart sounds. Exam reveals no gallop and no friction rub.  No murmur heard. Pulmonary/Chest: Effort normal and breath sounds normal. No respiratory distress. She has no wheezes. She has no rales.  Skin: She is not diaphoretic.  Vitals reviewed.       Assessment & Plan:     1. Benign essential HTN Stable. Discussed benefits of medications to lower BP and that any risks with medications is normally low. Discussed it would be more harmful to not have BP well controlled on the heart, kidneys, eyes, brain, etc than the risks these medicines may provide. She voiced understanding and agrees to continue. She is going to continue eating healthier and her weight loss journey. If she continues to lose weight it may be possible to lower or stop the BP medication but not at this time. She agrees. I will see her back in the next coming months for a CPE.  I spent approximately 30 minutes with the patient today. Over 50% of this time was spent with counseling and educating the patient.      Stacey Malta  Dorothy Puffer, PA-C  [facility]

## 2017-11-18 ENCOUNTER — Telehealth: Payer: Self-pay

## 2017-11-18 NOTE — Telephone Encounter (Signed)
LMTCB and schedule AWV prior to CPE on 12/10/17. If pt wanted a same day apt, she could schedule her AWV at 1:20 that day.  -MM

## 2017-11-19 NOTE — Telephone Encounter (Signed)
Scheduled pt at 1:20 PM for her AWV.  -MM

## 2017-12-09 DIAGNOSIS — L820 Inflamed seborrheic keratosis: Secondary | ICD-10-CM | POA: Diagnosis not present

## 2017-12-09 DIAGNOSIS — L821 Other seborrheic keratosis: Secondary | ICD-10-CM | POA: Diagnosis not present

## 2017-12-09 DIAGNOSIS — L570 Actinic keratosis: Secondary | ICD-10-CM | POA: Diagnosis not present

## 2017-12-09 DIAGNOSIS — L578 Other skin changes due to chronic exposure to nonionizing radiation: Secondary | ICD-10-CM | POA: Diagnosis not present

## 2017-12-09 NOTE — Progress Notes (Signed)
Patient: [name], Female    DOB: 11-02-1950, 67 y.o.   MRN: [id number] Visit Date: 12/10/2017  Today's Provider: Mar Daring, PA-C   Chief Complaint  Patient presents with  . Annual Exam   Subjective:    Annual physical exam [name] is a 67 y.o. female who presents today for health maintenance and complete physical. She feels well. She reports exercising daily. She reports she is sleeping fairly well. -----------------------------------------------------------------   Review of Systems  Constitutional: Positive for diaphoresis and fatigue.  HENT: Positive for congestion, sneezing and sore throat.   Eyes: Positive for photophobia and itching.  Respiratory: Positive for apnea, cough, choking, chest tightness and wheezing. Negative for shortness of breath.   Cardiovascular: Negative.   Gastrointestinal: Negative.   Endocrine: Negative.   Musculoskeletal: Positive for arthralgias and back pain.  Skin: Negative.   Allergic/Immunologic: Positive for environmental allergies.  Neurological: Positive for dizziness and numbness. Negative for weakness.  Psychiatric/Behavioral: Positive for decreased concentration.    Social History      She  reports that she has never smoked. She has never used smokeless tobacco. She reports that she does not drink alcohol or use drugs.       Social History   Socioeconomic History  . Marital status: Married    Spouse name: Alyson Locket. Lissa Merlin  . Number of children: 2  . Years of education: Not on file  . Highest education level: Associate degree: occupational, Hotel manager, or vocational program  Occupational History  . Occupation: retired  Scientific laboratory technician  . Financial resource strain: Not hard at all  . Food insecurity:    Worry: Never true    Inability: Never true  . Transportation needs:    Medical: No    Non-medical: No  Tobacco Use  . Smoking status: Never Smoker  . Smokeless tobacco: Never Used  Substance and  Sexual Activity  . Alcohol use: No  . Drug use: No  . Sexual activity: Not on file  Lifestyle  . Physical activity:    Days per week: Not on file    Minutes per session: Not on file  . Stress: Only a little  Relationships  . Social connections:    Talks on phone: Not on file    Gets together: Not on file    Attends religious service: Not on file    Active member of club or organization: Not on file    Attends meetings of clubs or organizations: Not on file    Relationship status: Not on file  Other Topics Concern  . Not on file  Social History Narrative  . Not on file    Past Medical History:  Diagnosis Date  . Allergy   . Anxiety   . Breast mass 10/07/2016   left 2:00  . Depression   . GERD (gastroesophageal reflux disease)      Patient Active Problem List   Diagnosis Date Noted  . Allergic rhinitis, seasonal 01/23/2015  . Anxiety 01/23/2015  . Benign essential HTN 01/23/2015  . Clinical depression 01/23/2015  . Pharyngeal inflammation 01/23/2015  . Seasonal affective disorder ([place]) 01/23/2015  . Abnormal chest x-ray 01/23/2015    Past Surgical History:  Procedure Laterality Date  . ABDOMINAL HYSTERECTOMY  1988   complete; due to fibroids  . BLADDER REPAIR  2007  . CHOLECYSTECTOMY  2003  . KNEE SURGERY Left 2012  . TONSILLECTOMY  1960    Family History  Family Status  Relation Name Status  . Mother  Deceased at age 55       lung disease  . Father  Deceased at age 38       lung cancer  . Sister  Deceased at age 66       head and neck cancer  . Son  Alive  . Son  Alive        Her family history includes COPD in her father and mother; Cervical cancer in her mother; Hypertension in her father.      No Known Allergies   Current Outpatient Medications:  .  amLODipine-benazepril (LOTREL) 5-10 MG capsule, TAKE 1 CAPSULE DAILY, Disp: 90 capsule, Rfl: 3 .  Ascorbic Acid (VITAMIN C) 100 MG tablet, Take by mouth daily., Disp: , Rfl:  .   Cholecalciferol (VITAMIN D3) 2000 units TABS, Take 2,000 Units by mouth daily., Disp: , Rfl:  .  escitalopram (LEXAPRO) 10 MG tablet, TAKE 1 TABLET DAILY, Disp: 90 tablet, Rfl: 3 .  Magnesium 400 MG CAPS, Take by mouth daily., Disp: , Rfl:  .  Multiple Vitamins-Minerals (MULTIVITAMIN WOMEN 50+ PO), Take by mouth daily., Disp: , Rfl:  .  Potassium 75 MG TABS, Take by mouth daily., Disp: , Rfl:  .  ALPRAZolam (XANAX) 0.5 MG tablet, Take 1 tablet (0.5 mg total) by mouth 2 (two) times daily as needed for anxiety. Reported on 10/16/2015 (Patient not taking: Reported on 11/12/2017), Disp: 60 tablet, Rfl: 5   Patient Care Team: Mar Daring, PA-C as PCP - General (Family Medicine) Ralene Bathe, MD as Consulting Physician (Dermatology) Bryson Ha, OD as Consulting Physician (Optometry) Everlene Farrier, MD as Consulting Physician (Obstetrics and Gynecology)      Objective:   Vitals: BP (!) 138/50   Pulse 64   Temp 98.5 F (36.9 C) (Oral)   Ht 5\' 3"  (1.6 m)   Wt 156 lb (70.8 kg)   BMI 27.63 kg/m    Vitals:   12/10/17 1404  BP: (!) 138/50  Pulse: 64  Temp: 98.5 F (36.9 C)  TempSrc: Oral  Weight: 156 lb (70.8 kg)  Height: 5\' 3"  (1.6 m)     Physical Exam  Constitutional: She is oriented to person, place, and time. She appears well-developed and well-nourished. No distress.  HENT:  Head: Normocephalic and atraumatic.  Right Ear: Hearing, tympanic membrane, external ear and ear canal normal.  Left Ear: Hearing, tympanic membrane, external ear and ear canal normal.  Nose: Nose normal.  Mouth/Throat: Uvula is midline, oropharynx is clear and moist and mucous membranes are normal. No oropharyngeal exudate.  Eyes: Pupils are equal, round, and reactive to light. Conjunctivae and EOM are normal. Right eye exhibits no discharge. Left eye exhibits no discharge. No scleral icterus.  Neck: Normal range of motion. Neck supple. No JVD present. Carotid bruit is not present. No  tracheal deviation present. No thyromegaly present.  Cardiovascular: Normal rate, regular rhythm, normal heart sounds and intact distal pulses. Exam reveals no gallop and no friction rub.  No murmur heard. Pulmonary/Chest: Effort normal. No respiratory distress. She has wheezes (throughout). She has no rales. She exhibits no tenderness.  Abdominal: Soft. Bowel sounds are normal. She exhibits no distension and no mass. There is no tenderness. There is no rebound and no guarding.  Musculoskeletal: Normal range of motion. She exhibits no edema or tenderness.  Lymphadenopathy:    She has no cervical adenopathy.  Neurological: She is alert and oriented to  person, place, and time.  Skin: Skin is warm and dry. No rash noted. She is not diaphoretic.  Psychiatric: She has a normal mood and affect. Her behavior is normal. Judgment and thought content normal.  Vitals reviewed.    Depression Screen PHQ 2/9 Scores 12/10/2017  PHQ - 2 Score 3  PHQ- 9 Score 12      Assessment & Plan:     Routine Health Maintenance and Physical Exam  Exercise Activities and Dietary recommendations Goals    . DIET - REDUCE PORTION SIZE     Recommend cutting portion sizes in half and eating 3 small meals a day with two healthy snacks a day.        Immunization History  Administered Date(s) Administered  . Influenza, High Dose Seasonal PF 07/06/2017  . Tdap 03/14/2009    Health Maintenance  Topic Date Due  . Hepatitis C Screening  1951-01-30  . PNA vac Low Risk Adult (1 of 2 - PCV13) 06/09/2015  . INFLUENZA VACCINE  03/18/2018  . MAMMOGRAM  10/07/2018  . TETANUS/TDAP  03/15/2019  . COLONOSCOPY  04/17/2019  . DEXA SCAN  05/18/2020     Discussed health benefits of physical activity, and encouraged her to engage in regular exercise appropriate for her age and condition.    1. Annual physical exam Normal physical exam today. Will check labs as below and f/u pending lab results. If labs are stable and  WNL she will not need to have these rechecked for one year at her next annual physical exam. She is to call the office in the meantime if she has any acute issue, questions or concerns. - CBC w/Diff/Platelet - Comprehensive Metabolic Panel (CMET) - Lipid Profile - TSH  2. Benign essential HTN Stable. Continue amlodipine-benazepril 5-10mg . Will check labs as below and f/u pending results. - CBC w/Diff/Platelet - Comprehensive Metabolic Panel (CMET) - Lipid Profile  3. Hypercholesterolemia Diet controlled. Will check labs as below and f/u pending results. - CBC w/Diff/Platelet - Comprehensive Metabolic Panel (CMET) - Lipid Profile  4. Recurrent major depressive disorder, in full remission ([place]) Stable on lexapro 10mg . Will check labs as below and f/u pending results. - TSH  5. Need for hepatitis C screening test - Hepatitis C Antibody  6. Bronchitis Worsening. Will start zpak and prednisone as below. Push fluids. Call if symptoms worsen. - azithromycin (ZITHROMAX) 250 MG tablet; Take 2 tablets PO on day one, and one tablet PO daily thereafter until completed.  Dispense: 6 tablet; Refill: 0 - chlorpheniramine-HYDROcodone (TUSSIONEX PENNKINETIC ER) 10-8 MG/5ML SUER; Take 5 mLs by mouth every 12 (twelve) hours as needed for cough.  Dispense: 140 mL; Refill: 0  7. Cough Worsening symptoms that has not responded to OTC medications. Will give Tussionex cough syrup as below for nighttime cough. Drowsiness precautions given to patient. Stay well hydrated. Use delsym, robitussin OR mucinex for daytime cough. - chlorpheniramine-HYDROcodone (TUSSIONEX PENNKINETIC ER) 10-8 MG/5ML SUER; Take 5 mLs by mouth every 12 (twelve) hours as needed for cough.  Dispense: 140 mL; Refill: 0  --------------------------------------------------------------------    Mar Daring, PA-C  [facility]

## 2017-12-10 ENCOUNTER — Ambulatory Visit (INDEPENDENT_AMBULATORY_CARE_PROVIDER_SITE_OTHER): Payer: Managed Care, Other (non HMO) | Admitting: Physician Assistant

## 2017-12-10 ENCOUNTER — Ambulatory Visit (INDEPENDENT_AMBULATORY_CARE_PROVIDER_SITE_OTHER): Payer: Managed Care, Other (non HMO)

## 2017-12-10 ENCOUNTER — Encounter: Payer: Self-pay | Admitting: Physician Assistant

## 2017-12-10 VITALS — BP 138/50 | HR 64 | Temp 98.5°F | Ht 63.0 in | Wt 156.0 lb

## 2017-12-10 DIAGNOSIS — Z0000 Encounter for general adult medical examination without abnormal findings: Secondary | ICD-10-CM

## 2017-12-10 DIAGNOSIS — R059 Cough, unspecified: Secondary | ICD-10-CM

## 2017-12-10 DIAGNOSIS — J40 Bronchitis, not specified as acute or chronic: Secondary | ICD-10-CM

## 2017-12-10 DIAGNOSIS — R05 Cough: Secondary | ICD-10-CM

## 2017-12-10 DIAGNOSIS — E7800 Pure hypercholesterolemia, unspecified: Secondary | ICD-10-CM

## 2017-12-10 DIAGNOSIS — Z1159 Encounter for screening for other viral diseases: Secondary | ICD-10-CM

## 2017-12-10 DIAGNOSIS — I10 Essential (primary) hypertension: Secondary | ICD-10-CM

## 2017-12-10 DIAGNOSIS — F3342 Major depressive disorder, recurrent, in full remission: Secondary | ICD-10-CM | POA: Diagnosis not present

## 2017-12-10 DIAGNOSIS — Z0001 Encounter for general adult medical examination with abnormal findings: Secondary | ICD-10-CM | POA: Diagnosis not present

## 2017-12-10 MED ORDER — HYDROCOD POLST-CPM POLST ER 10-8 MG/5ML PO SUER: 5.0000 mL | Freq: Two times a day (BID) | ORAL | 0 refills | Status: DC | PRN

## 2017-12-10 MED ORDER — AZITHROMYCIN 250 MG PO TABS: ORAL_TABLET | ORAL | 0 refills | Status: DC

## 2017-12-10 NOTE — Patient Instructions (Signed)
Health Maintenance for Postmenopausal Women Menopause is a normal process in which your reproductive ability comes to an end. This process happens gradually over a span of months to years, usually between the ages of 22 and 9. Menopause is complete when you have missed 12 consecutive menstrual periods. It is important to talk with your health care provider about some of the most common conditions that affect postmenopausal women, such as heart disease, cancer, and bone loss (osteoporosis). Adopting a healthy lifestyle and getting preventive care can help to promote your health and wellness. Those actions can also lower your chances of developing some of these common conditions. What should I know about menopause? During menopause, you may experience a number of symptoms, such as:  Moderate-to-severe hot flashes.  Night sweats.  Decrease in sex drive.  Mood swings.  Headaches.  Tiredness.  Irritability.  Memory problems.  Insomnia.  Choosing to treat or not to treat menopausal changes is an individual decision that you make with your health care provider. What should I know about hormone replacement therapy and supplements? Hormone therapy products are effective for treating symptoms that are associated with menopause, such as hot flashes and night sweats. Hormone replacement carries certain risks, especially as you become older. If you are thinking about using estrogen or estrogen with progestin treatments, discuss the benefits and risks with your health care provider. What should I know about heart disease and stroke? Heart disease, heart attack, and stroke become more likely as you age. This may be due, in part, to the hormonal changes that your body experiences during menopause. These can affect how your body processes dietary fats, triglycerides, and cholesterol. Heart attack and stroke are both medical emergencies. There are many things that you can do to help prevent heart disease  and stroke:  Have your blood pressure checked at least every 1-2 years. High blood pressure causes heart disease and increases the risk of stroke.  If you are 53-22 years old, ask your health care provider if you should take aspirin to prevent a heart attack or a stroke.  Do not use any tobacco products, including cigarettes, chewing tobacco, or electronic cigarettes. If you need help quitting, ask your health care provider.  It is important to eat a healthy diet and maintain a healthy weight. ? Be sure to include plenty of vegetables, fruits, low-fat dairy products, and lean protein. ? Avoid eating foods that are high in solid fats, added sugars, or salt (sodium).  Get regular exercise. This is one of the most important things that you can do for your health. ? Try to exercise for at least 150 minutes each week. The type of exercise that you do should increase your heart rate and make you sweat. This is known as moderate-intensity exercise. ? Try to do strengthening exercises at least twice each week. Do these in addition to the moderate-intensity exercise.  Know your numbers.Ask your health care provider to check your cholesterol and your blood glucose. Continue to have your blood tested as directed by your health care provider.  What should I know about cancer screening? There are several types of cancer. Take the following steps to reduce your risk and to catch any cancer development as early as possible. Breast Cancer  Practice breast self-awareness. ? This means understanding how your breasts normally appear and feel. ? It also means doing regular breast self-exams. Let your health care provider know about any changes, no matter how small.  If you are 40  or older, have a clinician do a breast exam (clinical breast exam or CBE) every year. Depending on your age, family history, and medical history, it may be recommended that you also have a yearly breast X-ray (mammogram).  If you  have a family history of breast cancer, talk with your health care provider about genetic screening.  If you are at high risk for breast cancer, talk with your health care provider about having an MRI and a mammogram every year.  Breast cancer (BRCA) gene test is recommended for women who have family members with BRCA-related cancers. Results of the assessment will determine the need for genetic counseling and BRCA1 and for BRCA2 testing. BRCA-related cancers include these types: ? Breast. This occurs in males or females. ? Ovarian. ? Tubal. This may also be called fallopian tube cancer. ? Cancer of the abdominal or pelvic lining (peritoneal cancer). ? Prostate. ? Pancreatic.  Cervical, Uterine, and Ovarian Cancer Your health care provider may recommend that you be screened regularly for cancer of the pelvic organs. These include your ovaries, uterus, and vagina. This screening involves a pelvic exam, which includes checking for microscopic changes to the surface of your cervix (Pap test).  For women ages 21-65, health care providers may recommend a pelvic exam and a Pap test every three years. For women ages 79-65, they may recommend the Pap test and pelvic exam, combined with testing for human papilloma virus (HPV), every five years. Some types of HPV increase your risk of cervical cancer. Testing for HPV may also be done on women of any age who have unclear Pap test results.  Other health care providers may not recommend any screening for nonpregnant women who are considered low risk for pelvic cancer and have no symptoms. Ask your health care provider if a screening pelvic exam is right for you.  If you have had past treatment for cervical cancer or a condition that could lead to cancer, you need Pap tests and screening for cancer for at least 20 years after your treatment. If Pap tests have been discontinued for you, your risk factors (such as having a new sexual partner) need to be  reassessed to determine if you should start having screenings again. Some women have medical problems that increase the chance of getting cervical cancer. In these cases, your health care provider may recommend that you have screening and Pap tests more often.  If you have a family history of uterine cancer or ovarian cancer, talk with your health care provider about genetic screening.  If you have vaginal bleeding after reaching menopause, tell your health care provider.  There are currently no reliable tests available to screen for ovarian cancer.  Lung Cancer Lung cancer screening is recommended for adults 69-62 years old who are at high risk for lung cancer because of a history of smoking. A yearly low-dose CT scan of the lungs is recommended if you:  Currently smoke.  Have a history of at least 30 pack-years of smoking and you currently smoke or have quit within the past 15 years. A pack-year is smoking an average of one pack of cigarettes per day for one year.  Yearly screening should:  Continue until it has been 15 years since you quit.  Stop if you develop a health problem that would prevent you from having lung cancer treatment.  Colorectal Cancer  This type of cancer can be detected and can often be prevented.  Routine colorectal cancer screening usually begins at  age 42 and continues through age 45.  If you have risk factors for colon cancer, your health care provider may recommend that you be screened at an earlier age.  If you have a family history of colorectal cancer, talk with your health care provider about genetic screening.  Your health care provider may also recommend using home test kits to check for hidden blood in your stool.  A small camera at the end of a tube can be used to examine your colon directly (sigmoidoscopy or colonoscopy). This is done to check for the earliest forms of colorectal cancer.  Direct examination of the colon should be repeated every  5-10 years until age 71. However, if early forms of precancerous polyps or small growths are found or if you have a family history or genetic risk for colorectal cancer, you may need to be screened more often.  Skin Cancer  Check your skin from head to toe regularly.  Monitor any moles. Be sure to tell your health care provider: ? About any new moles or changes in moles, especially if there is a change in a mole's shape or color. ? If you have a mole that is larger than the size of a pencil eraser.  If any of your family members has a history of skin cancer, especially at a young age, talk with your health care provider about genetic screening.  Always use sunscreen. Apply sunscreen liberally and repeatedly throughout the day.  Whenever you are outside, protect yourself by wearing long sleeves, pants, a wide-brimmed hat, and sunglasses.  What should I know about osteoporosis? Osteoporosis is a condition in which bone destruction happens more quickly than new bone creation. After menopause, you may be at an increased risk for osteoporosis. To help prevent osteoporosis or the bone fractures that can happen because of osteoporosis, the following is recommended:  If you are 46-71 years old, get at least 1,000 mg of calcium and at least 600 mg of vitamin D per day.  If you are older than age 55 but younger than age 65, get at least 1,200 mg of calcium and at least 600 mg of vitamin D per day.  If you are older than age 54, get at least 1,200 mg of calcium and at least 800 mg of vitamin D per day.  Smoking and excessive alcohol intake increase the risk of osteoporosis. Eat foods that are rich in calcium and vitamin D, and do weight-bearing exercises several times each week as directed by your health care provider. What should I know about how menopause affects my mental health? Depression may occur at any age, but it is more common as you become older. Common symptoms of depression  include:  Low or sad mood.  Changes in sleep patterns.  Changes in appetite or eating patterns.  Feeling an overall lack of motivation or enjoyment of activities that you previously enjoyed.  Frequent crying spells.  Talk with your health care provider if you think that you are experiencing depression. What should I know about immunizations? It is important that you get and maintain your immunizations. These include:  Tetanus, diphtheria, and pertussis (Tdap) booster vaccine.  Influenza every year before the flu season begins.  Pneumonia vaccine.  Shingles vaccine.  Your health care provider may also recommend other immunizations. This information is not intended to replace advice given to you by your health care provider. Make sure you discuss any questions you have with your health care provider. Document Released: 09/26/2005  Document Revised: 02/22/2016 Document Reviewed: 05/08/2015 [employer]vier Interactive Patient Education  2018 [employer]  

## 2017-12-10 NOTE — Progress Notes (Signed)
Subjective:   [name] is a 67 y.o. female who presents for an Initial Medicare Annual Wellness Visit.  Review of Systems    N/A  Cardiac Risk Factors include: advanced age (>14men, >51 women);hypertension     Objective:    Today's Vitals   12/10/17 1324 12/10/17 1332  BP: (!) 138/50   Pulse: 64   Temp: 98.5 F (36.9 C)   TempSrc: Oral   Weight: 156 lb (70.8 kg)   Height: 5\' 3"  (1.6 m)   PainSc: 0-No pain 0-No pain   Body mass index is 27.63 kg/m.  Advanced Directives 12/10/2017 01/23/2015  Does Patient Have a Medical Advance Directive? No No  Would patient like information on creating a medical advance directive? No - Patient declined -    Current Medications (verified) Outpatient Encounter Medications as of 12/10/2017  Medication Sig  . amLODipine-benazepril (LOTREL) 5-10 MG capsule TAKE 1 CAPSULE DAILY  . Ascorbic Acid (VITAMIN C) 100 MG tablet Take by mouth daily.  . Cholecalciferol (VITAMIN D3) 2000 units TABS Take 2,000 Units by mouth daily.  Marland Kitchen escitalopram (LEXAPRO) 10 MG tablet TAKE 1 TABLET DAILY  . Magnesium 400 MG CAPS Take by mouth daily.  . Multiple Vitamins-Minerals (MULTIVITAMIN WOMEN 50+ PO) Take by mouth daily.  . Potassium 75 MG TABS Take by mouth daily.  Marland Kitchen ALPRAZolam (XANAX) 0.5 MG tablet Take 1 tablet (0.5 mg total) by mouth 2 (two) times daily as needed for anxiety. Reported on 10/16/2015 (Patient not taking: Reported on 11/12/2017)   No facility-administered encounter medications on file as of 12/10/2017.     Allergies (verified) Patient has no known allergies.   History: Past Medical History:  Diagnosis Date  . Allergy   . Anxiety   . Breast mass 10/07/2016   left 2:00  . Depression   . GERD (gastroesophageal reflux disease)    Past Surgical History:  Procedure Laterality Date  . ABDOMINAL HYSTERECTOMY  1988   complete; due to fibroids  . BLADDER REPAIR  2007  . CHOLECYSTECTOMY  2003  . KNEE SURGERY Left 2012  . TONSILLECTOMY   1960   Family History  Problem Relation Age of Onset  . Cervical cancer Mother   . COPD Mother   . COPD Father   . Hypertension Father    Social History   Socioeconomic History  . Marital status: Married    Spouse name: Alyson Locket. Lissa Merlin  . Number of children: 2  . Years of education: Not on file  . Highest education level: Associate degree: occupational, Hotel manager, or vocational program  Occupational History  . Occupation: retired  Scientific laboratory technician  . Financial resource strain: Not hard at all  . Food insecurity:    Worry: Never true    Inability: Never true  . Transportation needs:    Medical: No    Non-medical: No  Tobacco Use  . Smoking status: Never Smoker  . Smokeless tobacco: Never Used  Substance and Sexual Activity  . Alcohol use: No  . Drug use: No  . Sexual activity: Not on file  Lifestyle  . Physical activity:    Days per week: Not on file    Minutes per session: Not on file  . Stress: Only a little  Relationships  . Social connections:    Talks on phone: Not on file    Gets together: Not on file    Attends religious service: Not on file    Active member of club or organization:  Not on file    Attends meetings of clubs or organizations: Not on file    Relationship status: Not on file  Other Topics Concern  . Not on file  Social History Narrative  . Not on file    Tobacco Counseling Counseling given: Not Answered   Clinical Intake:  Pre-visit preparation completed: Yes  Pain : No/denies pain Pain Score: 0-No pain     Nutritional Status: BMI 25 -29 Overweight Nutritional Risks: None Diabetes: No  How often do you need to have someone help you when you read instructions, pamphlets, or other written materials from your doctor or pharmacy?: 1 - Never  Interpreter Needed?: No  Information entered by :: [facility], LPN   Activities of Daily Living In your present state of health, do you have any difficulty performing the following  activities: 12/10/2017  Hearing? N  Vision? N  Difficulty concentrating or making decisions? N  Walking or climbing stairs? N  Dressing or bathing? N  Doing errands, shopping? N  Preparing Food and eating ? N  Using the Toilet? N  In the past six months, have you accidently leaked urine? N  Do you have problems with loss of bowel control? N  Managing your Medications? N  Managing your Finances? N  Housekeeping or managing your Housekeeping? N  Some recent data might be hidden     Immunizations and Health Maintenance Immunization History  Administered Date(s) Administered  . Influenza, High Dose Seasonal PF 07/06/2017  . Tdap 03/14/2009   Health Maintenance Due  Topic Date Due  . Hepatitis C Screening  21-Nov-1950  . PNA vac Low Risk Adult (1 of 2 - PCV13) 06/09/2015    Patient Care Team: Mar Daring, PA-C as PCP - General (Family Medicine) Ralene Bathe, MD as Consulting Physician (Dermatology) Bryson Ha, OD as Consulting Physician (Optometry) Everlene Farrier, MD as Consulting Physician (Obstetrics and Gynecology)  Indicate any recent Medical Services you may have received from other than [facility] providers in the past year (date may be approximate).     Assessment:   This is a routine wellness examination for [name].  Hearing/Vision screen Vision Screening Comments: Pt goes to [facility] for vision checks yearly. See Dr Kerin Ransom.   Dietary issues and exercise activities discussed: Current Exercise Habits: Home exercise routine, Type of exercise: walking, Time (Minutes): 30, Frequency (Times/Week): 5, Weekly Exercise (Minutes/Week): 150, Intensity: Mild, Exercise limited by: None identified  Goals    . DIET - REDUCE PORTION SIZE     Recommend cutting portion sizes in half and eating 3 small meals a day with two healthy snacks a day.       Depression Screen PHQ 2/9 Scores 12/10/2017  PHQ - 2 Score 3  PHQ- 9 Score 12    Fall Risk Fall Risk   12/10/2017 01/23/2015  Falls in the past year? Yes No  Number falls in past yr: 1 -  Comment accidentally stepped off of a small ledge -  Injury with Fall? No -  Follow up Falls prevention discussed -    Is the patient's home free of loose throw rugs in walkways, pet beds, electrical cords, etc?   yes      Grab bars in the bathroom? no      Handrails on the stairs?   yes      Adequate lighting?   yes  Timed Get Up and Go Performed N/A  Cognitive Function: Pt declined screening today.  Screening Tests Health Maintenance  Topic Date Due  . Hepatitis C Screening  11-Aug-1951  . PNA vac Low Risk Adult (1 of 2 - PCV13) 06/09/2015  . INFLUENZA VACCINE  03/18/2018  . MAMMOGRAM  10/07/2018  . TETANUS/TDAP  03/15/2019  . COLONOSCOPY  04/17/2019  . DEXA SCAN  05/18/2020    Qualifies for Shingles Vaccine? Due for Shingles vaccine. Declined my offer to administer today. Education has been provided regarding the importance of this vaccine. Pt has been advised to call her insurance company to determine her out of pocket expense. Advised she may also receive this vaccine at her local pharmacy or Health Dept. Verbalized acceptance and understanding.  Cancer Screenings: Lung: Low Dose CT Chest recommended if Age 23-80 years, 30 pack-year currently smoking OR have quit w/in 15years. Patient does not qualify. Breast: Up to date on Mammogram? Yes   Up to date of Bone Density/Dexa? Yes Colorectal: Up to date  Additional Screenings:  Hepatitis C Screening: Pt declines today.     Plan:  I have personally reviewed and addressed the Medicare Annual Wellness questionnaire and have noted the following in the patient's chart:  A. Medical and social history B. Use of alcohol, tobacco or illicit drugs  C. Current medications and supplements D. Functional ability and status E.  Nutritional status F.  Physical activity G. Advance directives H. List of other physicians I.  Hospitalizations,  surgeries, and ER visits in previous 12 months J.  [place] such as hearing and vision if needed, cognitive and depression L. Referrals and appointments - none  In addition, I have reviewed and discussed with patient certain preventive protocols, quality metrics, and best practice recommendations. A written personalized care plan for preventive services as well as general preventive health recommendations were provided to patient.  See attached scanned questionnaire for additional information.   Signed,  Fabio Neighbors, LPN Nurse Health Advisor   Nurse Recommendations: Pt declined Hep C screening and the Prevnar 13 vaccine today.

## 2017-12-10 NOTE — Patient Instructions (Addendum)
[name] , Thank you for taking time to come for your Medicare Wellness Visit. I appreciate your ongoing commitment to your health goals. Please review the following plan we discussed and let me know if I can assist you in the future.   Screening recommendations/referrals: Colonoscopy: Up to date Mammogram: Up to date Bone Density: Up to date Recommended yearly ophthalmology/optometry visit for glaucoma screening and checkup Recommended yearly dental visit for hygiene and checkup  Vaccinations: Influenza vaccine: N/A Pneumococcal vaccine: Pt declines today.  Tdap vaccine: Up to date Shingles vaccine: Pt declines today.     Advanced directives: Advance directive discussed with you today. Even though you declined this today please call our office should you change your mind and we can give you the proper paperwork for you to fill out.  Conditions/risks identified: Recommend cutting portion sizes in half and eating 3 small meals a day with two healthy snacks a day.   Next appointment: 2:00 PM today with Fenton Malling.   Preventive Care 68 Years and Older, Female Preventive care refers to lifestyle choices and visits with your health care provider that can promote health and wellness. What does preventive care include?  A yearly physical exam. This is also called an annual well check.  Dental exams once or twice a year.  Routine eye exams. Ask your health care provider how often you should have your eyes checked.  Personal lifestyle choices, including:  Daily care of your teeth and gums.  Regular physical activity.  Eating a healthy diet.  Avoiding tobacco and drug use.  Limiting alcohol use.  Practicing safe sex.  Taking low-dose aspirin every day.  Taking vitamin and mineral supplements as recommended by your health care provider. What happens during an annual well check? The services and screenings done by your health care provider during your annual well check  will depend on your age, overall health, lifestyle risk factors, and family history of disease. Counseling  Your health care provider may ask you questions about your:  Alcohol use.  Tobacco use.  Drug use.  Emotional well-being.  Home and relationship well-being.  Sexual activity.  Eating habits.  History of falls.  Memory and ability to understand (cognition).  Work and work Statistician.  Reproductive health. Screening  You may have the following tests or measurements:  Height, weight, and BMI.  Blood pressure.  Lipid and cholesterol levels. These may be checked every 5 years, or more frequently if you are over 28 years old.  Skin check.  Lung cancer screening. You may have this screening every year starting at age 100 if you have a 30-pack-year history of smoking and currently smoke or have quit within the past 15 years.  Fecal occult blood test (FOBT) of the stool. You may have this test every year starting at age 20.  Flexible sigmoidoscopy or colonoscopy. You may have a sigmoidoscopy every 5 years or a colonoscopy every 10 years starting at age 61.  Hepatitis C blood test.  Hepatitis B blood test.  Sexually transmitted disease (STD) testing.  Diabetes screening. This is done by checking your blood sugar (glucose) after you have not eaten for a while (fasting). You may have this done every 1-3 years.  Bone density scan. This is done to screen for osteoporosis. You may have this done starting at age 64.  Mammogram. This may be done every 1-2 years. Talk to your health care provider about how often you should have regular mammograms. Talk with your health  care provider about your test results, treatment options, and if necessary, the need for more tests. Vaccines  Your health care provider may recommend certain vaccines, such as:  Influenza vaccine. This is recommended every year.  Tetanus, diphtheria, and acellular pertussis (Tdap, Td) vaccine. You may  need a Td booster every 10 years.  Zoster vaccine. You may need this after age 43.  Pneumococcal 13-valent conjugate (PCV13) vaccine. One dose is recommended after age 53.  Pneumococcal polysaccharide (PPSV23) vaccine. One dose is recommended after age 57. Talk to your health care provider about which screenings and vaccines you need and how often you need them. This information is not intended to replace advice given to you by your health care provider. Make sure you discuss any questions you have with your health care provider. Document Released: 08/31/2015 Document Revised: 04/23/2016 Document Reviewed: 06/05/2015 Elsevier Interactive Patient Education  2017 [place] Prevention in the Home Falls can cause injuries. They can happen to people of all ages. There are many things you can do to make your home safe and to help prevent falls. What can I do on the outside of my home?  Regularly fix the edges of walkways and driveways and fix any cracks.  Remove anything that might make you trip as you walk through a door, such as a raised step or threshold.  Trim any bushes or trees on the path to your home.  Use bright outdoor lighting.  Clear any walking paths of anything that might make someone trip, such as rocks or tools.  Regularly check to see if handrails are loose or broken. Make sure that both sides of any steps have handrails.  Any raised decks and porches should have guardrails on the edges.  Have any leaves, snow, or ice cleared regularly.  Use sand or salt on walking paths during winter.  Clean up any spills in your garage right away. This includes oil or grease spills. What can I do in the bathroom?  Use night lights.  Install grab bars by the toilet and in the tub and shower. Do not use towel bars as grab bars.  Use non-skid mats or decals in the tub or shower.  If you need to sit down in the shower, use a plastic, non-slip stool.  Keep the floor  dry. Clean up any water that spills on the floor as soon as it happens.  Remove soap buildup in the tub or shower regularly.  Attach bath mats securely with double-sided non-slip rug tape.  Do not have throw rugs and other things on the floor that can make you trip. What can I do in the bedroom?  Use night lights.  Make sure that you have a light by your bed that is easy to reach.  Do not use any sheets or blankets that are too big for your bed. They should not hang down onto the floor.  Have a firm chair that has side arms. You can use this for support while you get dressed.  Do not have throw rugs and other things on the floor that can make you trip. What can I do in the kitchen?  Clean up any spills right away.  Avoid walking on wet floors.  Keep items that you use a lot in easy-to-reach places.  If you need to reach something above you, use a strong step stool that has a grab bar.  Keep electrical cords out of the way.  Do not use floor  polish or wax that makes floors slippery. If you must use wax, use non-skid floor wax.  Do not have throw rugs and other things on the floor that can make you trip. What can I do with my stairs?  Do not leave any items on the stairs.  Make sure that there are handrails on both sides of the stairs and use them. Fix handrails that are broken or loose. Make sure that handrails are as long as the stairways.  Check any carpeting to make sure that it is firmly attached to the stairs. Fix any carpet that is loose or worn.  Avoid having throw rugs at the top or bottom of the stairs. If you do have throw rugs, attach them to the floor with carpet tape.  Make sure that you have a light switch at the top of the stairs and the bottom of the stairs. If you do not have them, ask someone to add them for you. What else can I do to help prevent falls?  Wear shoes that:  Do not have high heels.  Have rubber bottoms.  Are comfortable and fit you  well.  Are closed at the toe. Do not wear sandals.  If you use a stepladder:  Make sure that it is fully opened. Do not climb a closed stepladder.  Make sure that both sides of the stepladder are locked into place.  Ask someone to hold it for you, if possible.  Clearly mark and make sure that you can see:  Any grab bars or handrails.  First and last steps.  Where the edge of each step is.  Use tools that help you move around (mobility aids) if they are needed. These include:  Canes.  Walkers.  Scooters.  Crutches.  Turn on the lights when you go into a dark area. Replace any light bulbs as soon as they burn out.  Set up your furniture so you have a clear path. Avoid moving your furniture around.  If any of your floors are uneven, fix them.  If there are any pets around you, be aware of where they are.  Review your medicines with your doctor. Some medicines can make you feel dizzy. This can increase your chance of falling. Ask your doctor what other things that you can do to help prevent falls. This information is not intended to replace advice given to you by your health care provider. Make sure you discuss any questions you have with your health care provider. Document Released: 05/31/2009 Document Revised: 01/10/2016 Document Reviewed: 09/08/2014 Elsevier Interactive Patient Education  2017 [employer].

## 2017-12-11 ENCOUNTER — Encounter: Payer: Self-pay | Admitting: Physician Assistant

## 2017-12-12 LAB — COMPREHENSIVE METABOLIC PANEL
ALBUMIN: 4.4 g/dL (ref 3.6–4.8)
ALK PHOS: 73 IU/L (ref 39–117)
ALT: 17 IU/L (ref 0–32)
AST: 23 IU/L (ref 0–40)
Albumin/Globulin Ratio: 2.3 — ABNORMAL HIGH (ref 1.2–2.2)
BILIRUBIN TOTAL: 0.4 mg/dL (ref 0.0–1.2)
BUN / CREAT RATIO: 19 (ref 12–28)
BUN: 20 mg/dL (ref 8–27)
CO2: 25 mmol/L (ref 20–29)
Calcium: 9.2 mg/dL (ref 8.7–10.3)
Chloride: 101 mmol/L (ref 96–106)
Creatinine, Ser: 1.03 mg/dL — ABNORMAL HIGH (ref 0.57–1.00)
GFR calc Af Amer: 65 mL/min/{1.73_m2} (ref >59)
GFR calc non Af Amer: 56 mL/min/{1.73_m2} — ABNORMAL LOW (ref >59)
GLOBULIN, TOTAL: 1.9 g/dL (ref 1.5–4.5)
Glucose: 85 mg/dL (ref 65–99)
Potassium: 4.5 mmol/L (ref 3.5–5.2)
SODIUM: 140 mmol/L (ref 134–144)
Total Protein: 6.3 g/dL (ref 6.0–8.5)

## 2017-12-12 LAB — CBC WITH DIFFERENTIAL/PLATELET
BASOS: 0 %
Basophils Absolute: 0 10*3/uL (ref 0.0–0.2)
EOS (ABSOLUTE): 0.4 10*3/uL (ref 0.0–0.4)
EOS: 7 %
HEMATOCRIT: 38.8 % (ref 34.0–46.6)
HEMOGLOBIN: 13 g/dL (ref 11.1–15.9)
Immature Grans (Abs): 0 10*3/uL (ref 0.0–0.1)
Immature Granulocytes: 0 %
LYMPHS ABS: 2.1 10*3/uL (ref 0.7–3.1)
Lymphs: 36 %
MCH: 29.1 pg (ref 26.6–33.0)
MCHC: 33.5 g/dL (ref 31.5–35.7)
MCV: 87 fL (ref 79–97)
MONOCYTES: 8 %
Monocytes Absolute: 0.4 10*3/uL (ref 0.1–0.9)
NEUTROS ABS: 2.8 10*3/uL (ref 1.4–7.0)
Neutrophils: 49 %
Platelets: 268 10*3/uL (ref 150–379)
RBC: 4.47 x10E6/uL (ref 3.77–5.28)
RDW: 13.8 % (ref 12.3–15.4)
WBC: 5.7 10*3/uL (ref 3.4–10.8)

## 2017-12-12 LAB — TSH: TSH: 2.14 u[IU]/mL (ref 0.450–4.500)

## 2017-12-12 LAB — LIPID PANEL
CHOLESTEROL TOTAL: 194 mg/dL (ref 100–199)
Chol/HDL Ratio: 3.9 ratio (ref 0.0–4.4)
HDL: 50 mg/dL (ref >39)
LDL CALC: 126 mg/dL — AB (ref 0–99)
Triglycerides: 88 mg/dL (ref 0–149)
VLDL Cholesterol Cal: 18 mg/dL (ref 5–40)

## 2017-12-12 LAB — HEPATITIS C ANTIBODY: Hep C Virus Ab: 0.1 s/co ratio (ref 0.0–0.9)

## 2018-02-01 ENCOUNTER — Encounter: Payer: Self-pay | Admitting: Physician Assistant

## 2018-02-01 ENCOUNTER — Ambulatory Visit (INDEPENDENT_AMBULATORY_CARE_PROVIDER_SITE_OTHER): Payer: Managed Care, Other (non HMO) | Admitting: Physician Assistant

## 2018-02-01 VITALS — BP 110/60 | HR 78 | Resp 16 | Wt 156.0 lb

## 2018-02-01 DIAGNOSIS — J40 Bronchitis, not specified as acute or chronic: Secondary | ICD-10-CM

## 2018-02-01 DIAGNOSIS — L237 Allergic contact dermatitis due to plants, except food: Secondary | ICD-10-CM

## 2018-02-01 DIAGNOSIS — F3341 Major depressive disorder, recurrent, in partial remission: Secondary | ICD-10-CM | POA: Diagnosis not present

## 2018-02-01 MED ORDER — ESCITALOPRAM OXALATE 10 MG PO TABS: 15.0000 mg | ORAL_TABLET | Freq: Every day | ORAL | 3 refills | Status: DC

## 2018-02-01 MED ORDER — PREDNISONE 10 MG PO TABS: ORAL_TABLET | ORAL | 0 refills | Status: DC

## 2018-02-01 MED ORDER — AMOXICILLIN-POT CLAVULANATE 875-125 MG PO TABS: 1.0000 | ORAL_TABLET | Freq: Two times a day (BID) | ORAL | 0 refills | Status: DC

## 2018-02-01 NOTE — Progress Notes (Signed)
Patient: [name] Female    DOB: [birth year]   67 y.o.   MRN: [id number] Visit Date: 02/01/2018  Today's Provider: Mar Daring, PA-C   Chief Complaint  Patient presents with  . Rash  . Cough   Subjective:    HPI Patient here today with c/o dry cough with phlegm. The cough started 2 weeks ago. Reports feeling fatigue, SOB, not sleeping well and chest tightness. No wheezing, fever. Patient reports that she feels like her throat closes while talking. Patient taking Zyrtec at night.  Patient also has poison ivy.Reports that she got it 2-3 weeks ago and has been treating it with tecnu.  She also complains of worsening stress. She reports on June 1st her dog was kicked in the chest by a deer and had its chest split open. She had to rush him to the vet and then was sent to [facility] for the dog to have surgery. This was an expensive surgery so that has increased her stress. Then she is also trying to take care of him now. She also reports her husband has been under increased stress with work so she has been taking on a lot of the house hold work. This has caused her increasing anxiety and stress. She also reports not sleeping well.   Depression screen [facility] 2/9 02/01/2018 12/10/2017  Decreased Interest 3 3  Down, Depressed, Hopeless 2 0  PHQ - 2 Score 5 3  Altered sleeping 3 0  Tired, decreased energy 3 3  Change in appetite 3 3  Feeling bad or failure about yourself  3 0  Trouble concentrating 2 3  Moving slowly or fidgety/restless 0 0  Suicidal thoughts 0 0  PHQ-9 Score 19 12  Difficult doing work/chores Extremely dIfficult Very difficult   GAD 7 : Generalized Anxiety Score 02/01/2018  Nervous, Anxious, on Edge 3  Control/stop worrying 2  Worry too much - different things 2  Trouble relaxing 2  Restless 2  Easily annoyed or irritable 3  Afraid - awful might happen 0  Total GAD 7 Score 14  Anxiety Difficulty Extremely difficult     No Known  Allergies   Current Outpatient Medications:  .  amLODipine-benazepril (LOTREL) 5-10 MG capsule, TAKE 1 CAPSULE DAILY, Disp: 90 capsule, Rfl: 3 .  Ascorbic Acid (VITAMIN C) 100 MG tablet, Take by mouth daily., Disp: , Rfl:  .  Cholecalciferol (VITAMIN D3) 2000 units TABS, Take 2,000 Units by mouth daily., Disp: , Rfl:  .  escitalopram (LEXAPRO) 10 MG tablet, Take 1.5 tablets (15 mg total) by mouth daily., Disp: 135 tablet, Rfl: 3 .  Magnesium 400 MG CAPS, Take by mouth daily., Disp: , Rfl:  .  Multiple Vitamins-Minerals (MULTIVITAMIN WOMEN 50+ PO), Take by mouth daily., Disp: , Rfl:  .  Potassium 75 MG TABS, Take by mouth daily., Disp: , Rfl:  .  ALPRAZolam (XANAX) 0.5 MG tablet, Take 1 tablet (0.5 mg total) by mouth 2 (two) times daily as needed for anxiety. Reported on 10/16/2015 (Patient not taking: Reported on 11/12/2017), Disp: 60 tablet, Rfl: 5 .  amoxicillin-clavulanate (AUGMENTIN) 875-125 MG tablet, Take 1 tablet by mouth 2 (two) times daily., Disp: 20 tablet, Rfl: 0 .  predniSONE (DELTASONE) 10 MG tablet, Take 6 tabs PO on day 1&2, 5 tabs PO on day 3&4, 4 tabs PO on day 5&6, 3 tabs PO on day 7&8, 2 tabs PO on day 9&10, 1 tab PO on day 11&12.,  Disp: 42 tablet, Rfl: 0  Review of Systems  Constitutional: Positive for fatigue. Negative for fever.  HENT: Negative for postnasal drip and sore throat.   Respiratory: Positive for cough, chest tightness and shortness of breath. Negative for wheezing.   Cardiovascular: Negative for chest pain, palpitations and leg swelling.  Skin: Positive for rash.  Psychiatric/Behavioral: Positive for dysphoric mood and sleep disturbance.    Social History   Tobacco Use  . Smoking status: Never Smoker  . Smokeless tobacco: Never Used  Substance Use Topics  . Alcohol use: No   Objective:   BP 110/60 (BP Location: Left Arm, Patient Position: Sitting, Cuff Size: Normal)   Pulse 78   Resp 16   Wt 156 lb (70.8 kg)   SpO2 98%   BMI 27.63 kg/m     Physical Exam  Constitutional: She appears well-developed and well-nourished. No distress.  HENT:  Head: Normocephalic and atraumatic.  Right Ear: Hearing, tympanic membrane, external ear and ear canal normal.  Left Ear: Hearing, tympanic membrane, external ear and ear canal normal.  Nose: Nose normal.  Mouth/Throat: Uvula is midline, oropharynx is clear and moist and mucous membranes are normal. No oropharyngeal exudate.  Eyes: Pupils are equal, round, and reactive to light. Conjunctivae are normal. Right eye exhibits no discharge. Left eye exhibits no discharge. No scleral icterus.  Neck: Normal range of motion. Neck supple. No tracheal deviation present. No thyromegaly present.  Cardiovascular: Normal rate, regular rhythm and normal heart sounds. Exam reveals no gallop and no friction rub.  No murmur heard. Pulmonary/Chest: Effort normal. No stridor. No respiratory distress. She has no decreased breath sounds. She has wheezes (throughout). She has no rales.  Lymphadenopathy:    She has no cervical adenopathy.  Skin: Skin is warm and dry. Rash noted. Rash is vesicular (with some crusting and lichenification from scratching on left forearm). She is not diaphoretic.  Psychiatric: Her speech is normal and behavior is normal. Judgment and thought content normal. Her mood appears anxious. Cognition and memory are normal. She exhibits a depressed mood.  Vitals reviewed.       Assessment & Plan:     1. Poison oak dermatitis Will treat with prednisone as below.  - predniSONE (DELTASONE) 10 MG tablet; Take 6 tabs PO on day 1&2, 5 tabs PO on day 3&4, 4 tabs PO on day 5&6, 3 tabs PO on day 7&8, 2 tabs PO on day 9&10, 1 tab PO on day 11&12.  Dispense: 42 tablet; Refill: 0  2. Bronchitis Worsening. Will treat with prednisone and augmentin as below. Call if no improvements.  - predniSONE (DELTASONE) 10 MG tablet; Take 6 tabs PO on day 1&2, 5 tabs PO on day 3&4, 4 tabs PO on day 5&6, 3 tabs PO  on day 7&8, 2 tabs PO on day 9&10, 1 tab PO on day 11&12.  Dispense: 42 tablet; Refill: 0 - amoxicillin-clavulanate (AUGMENTIN) 875-125 MG tablet; Take 1 tablet by mouth 2 (two) times daily.  Dispense: 20 tablet; Refill: 0  3. Recurrent major depressive disorder, in partial remission (HCC) Worsening and not to goal. Will increase lexapro to 15mg  nightly. If needed, patient can increase to 20mg  nightly in 1-2 weeks. Call if no improvements or symptoms worsening. - escitalopram (LEXAPRO) 10 MG tablet; Take 1.5 tablets (15 mg total) by mouth daily.  Dispense: 135 tablet; Refill: [place], PA-C  [employer] [facility]

## 2018-04-14 ENCOUNTER — Encounter: Payer: Self-pay | Admitting: Physician Assistant

## 2018-04-14 DIAGNOSIS — F324 Major depressive disorder, single episode, in partial remission: Secondary | ICD-10-CM

## 2018-04-14 MED ORDER — ESCITALOPRAM OXALATE 20 MG PO TABS: 20.0000 mg | ORAL_TABLET | Freq: Every day | ORAL | 1 refills | Status: DC

## 2018-05-05 ENCOUNTER — Encounter: Payer: Self-pay | Admitting: Physician Assistant

## 2018-05-05 DIAGNOSIS — F324 Major depressive disorder, single episode, in partial remission: Secondary | ICD-10-CM

## 2018-05-05 MED ORDER — ESCITALOPRAM OXALATE 20 MG PO TABS: 20.0000 mg | ORAL_TABLET | Freq: Every day | ORAL | 1 refills | Status: DC

## 2018-05-12 ENCOUNTER — Ambulatory Visit (INDEPENDENT_AMBULATORY_CARE_PROVIDER_SITE_OTHER): Payer: Managed Care, Other (non HMO) | Admitting: Physician Assistant

## 2018-05-12 DIAGNOSIS — Z23 Encounter for immunization: Secondary | ICD-10-CM

## 2018-05-12 NOTE — Progress Notes (Signed)
Nurse Visit. Patient here to get her Prevnar13 and Influenza Vaccine. No URI symptoms.

## 2018-07-02 IMAGING — MG 2D DIGITAL DIAGNOSTIC BILATERAL MAMMOGRAM WITH CAD AND ADJUNCT T
8 of 15 series · 8 of 35 positions shown · non-contrast
Comparison: Previous exam(s).

CLINICAL DATA: Patient presents for palpable abnormality within the
lateral left breast anteriorly.

EXAM:
DIGITAL DIAGNOSTIC BILATERAL MAMMOGRAM WITH CAD
ULTRASOUND LEFT BREAST

[R CC]
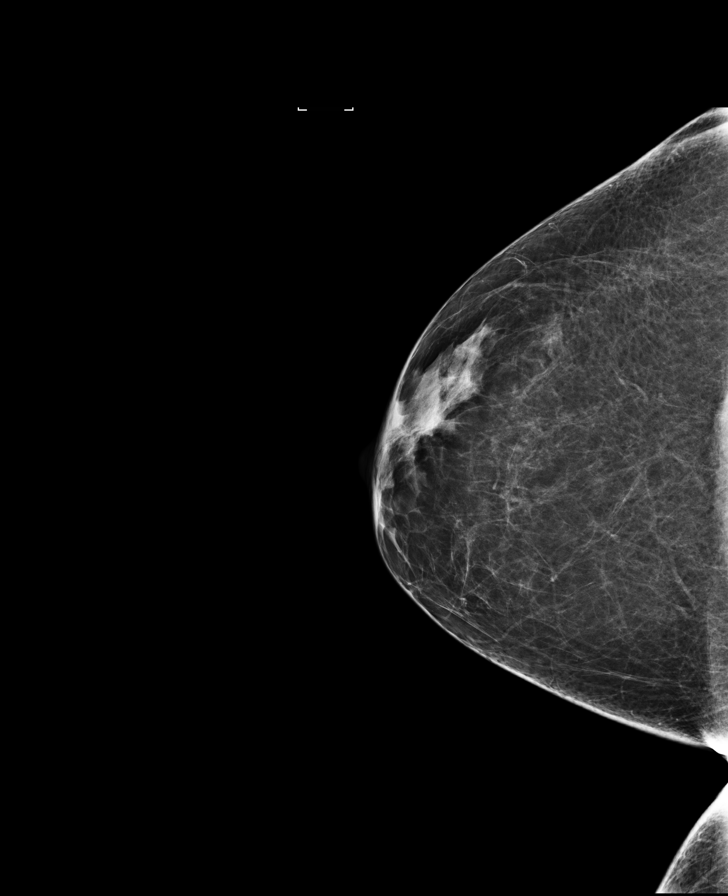

[R MLO synth-2D]
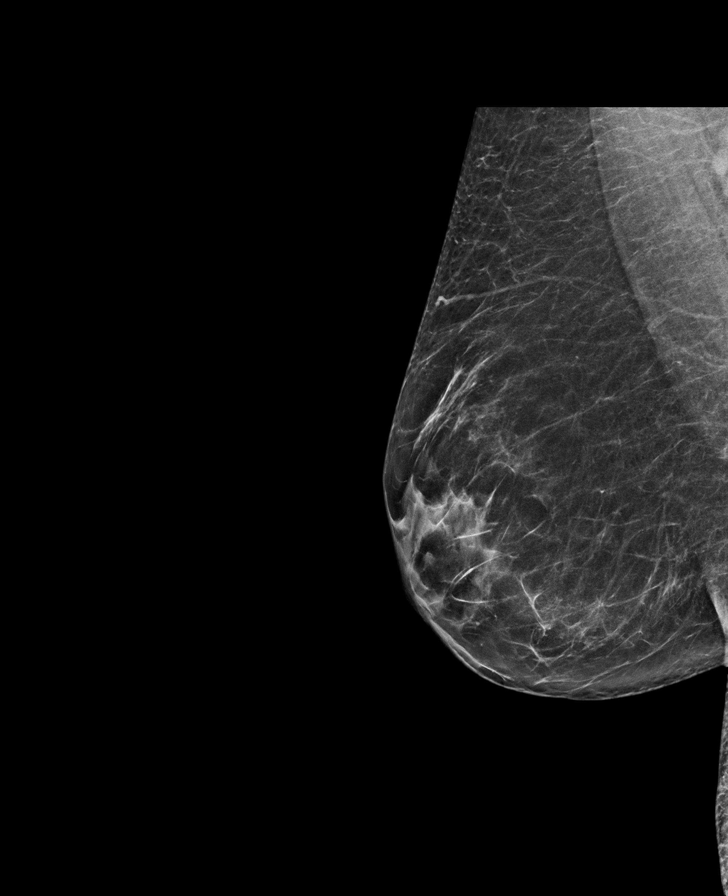

[R CC synth-2D]
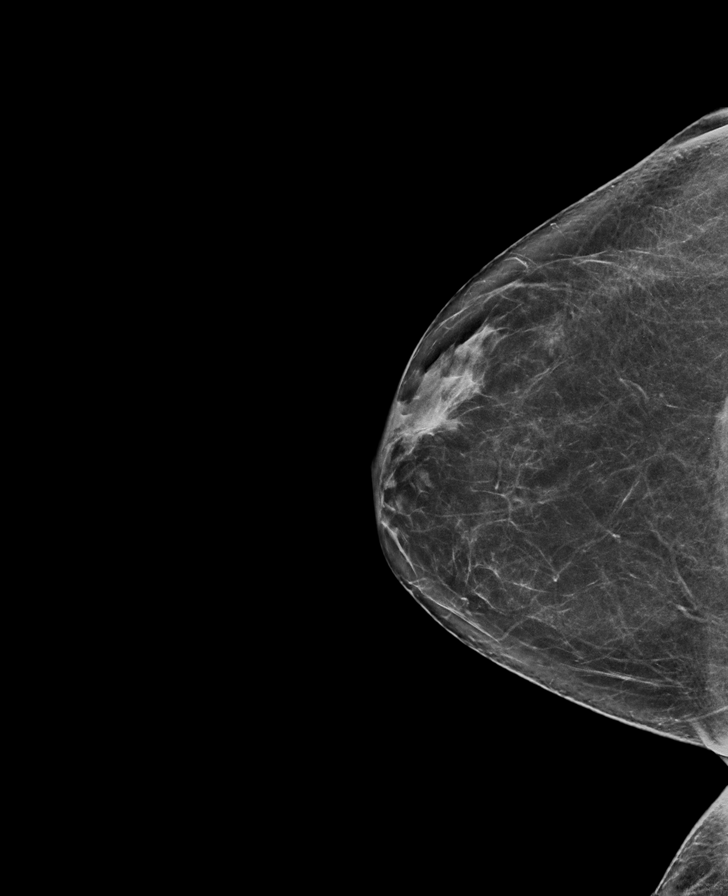

[L TAN]
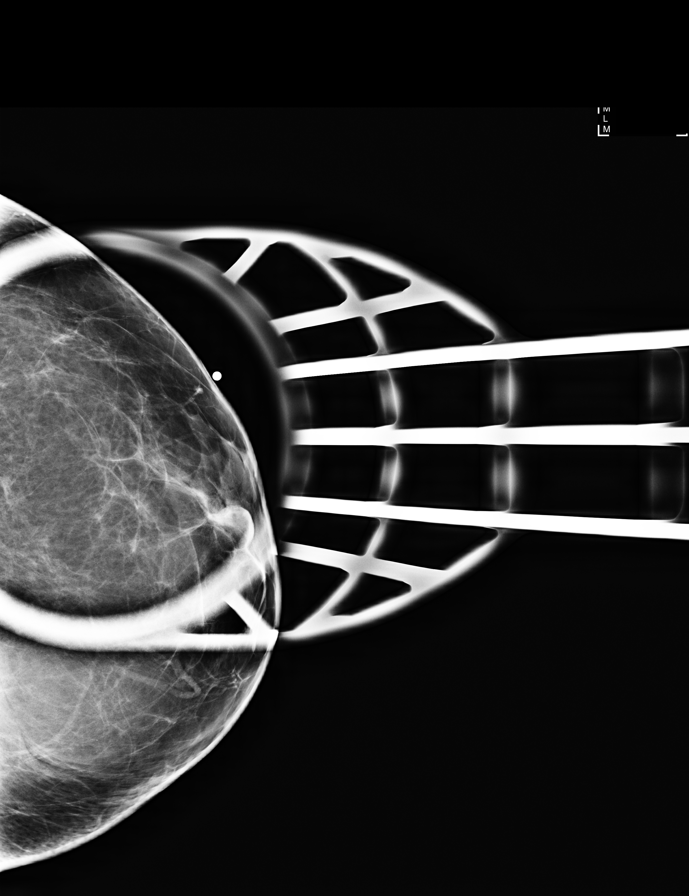

[L MLO synth-2D]
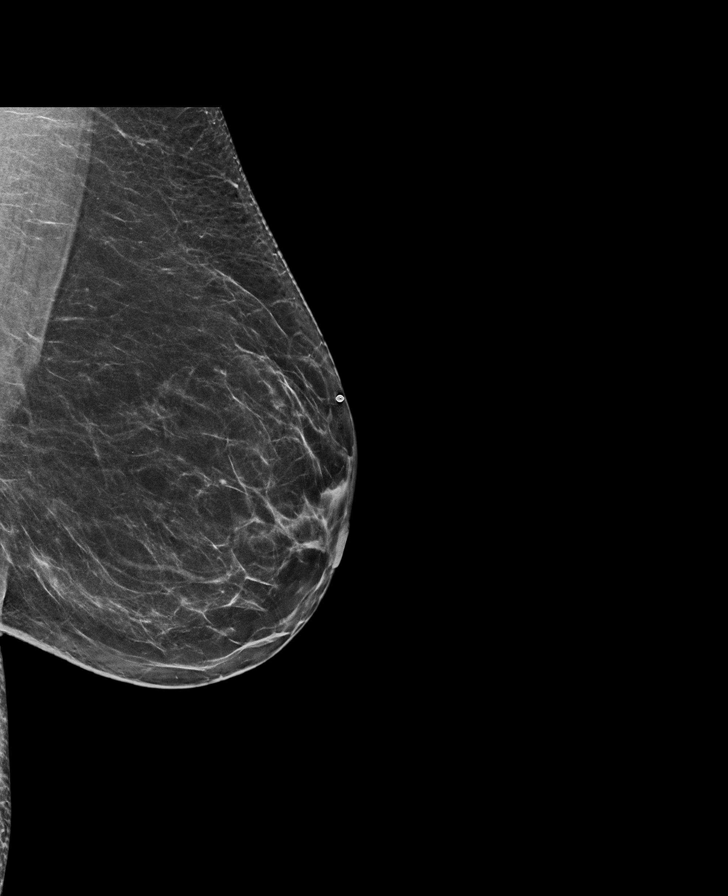

[L MLO]
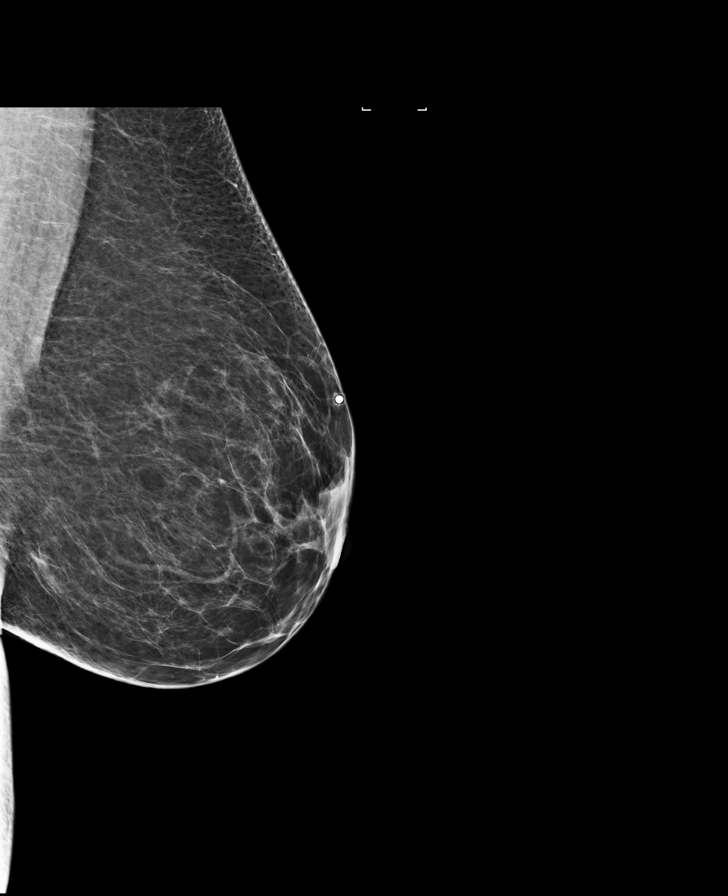

[R MLO]
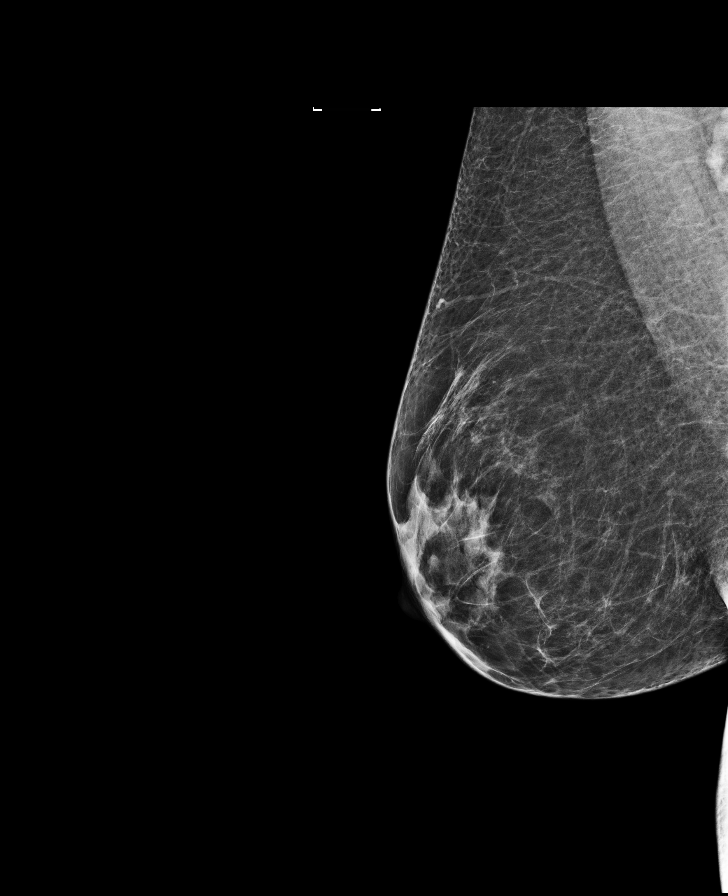

[L TAN synth-2D]
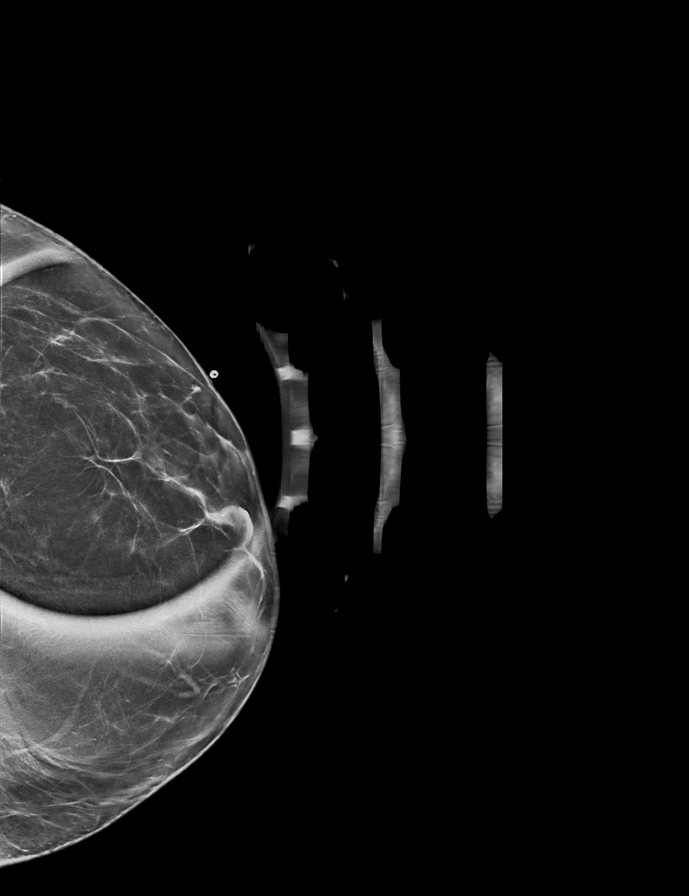

[8 of 35 positions shown; findings below may reference images not displayed]

ACR Breast Density Category b: There are scattered areas of
fibroglandular density.
FINDINGS: No concerning masses, calcifications or areas of architectural
distortion identified within either breast.

Mammographic images were processed with CAD.

Targeted ultrasound is performed, showing no suspicious mass within
the lateral left breast anterior depth at the patient reported site
of palpable abnormality.
IMPRESSION: No mammographic evidence for malignancy.

RECOMMENDATION:
Continued clinical evaluation for reported left breast palpable
abnormality.

Screening mammogram in one year.(Code:KS-[id number]).

I have discussed the findings and recommendations with the patient.
Results were also provided in writing at the conclusion of the
visit. If applicable, a reminder letter will be sent to the patient
regarding the next appointment.

BI-RADS CATEGORY  1: Negative.

## 2018-08-02 DIAGNOSIS — D225 Melanocytic nevi of trunk: Secondary | ICD-10-CM | POA: Diagnosis not present

## 2018-08-02 DIAGNOSIS — L659 Nonscarring hair loss, unspecified: Secondary | ICD-10-CM | POA: Diagnosis not present

## 2018-08-02 DIAGNOSIS — L821 Other seborrheic keratosis: Secondary | ICD-10-CM | POA: Diagnosis not present

## 2018-08-02 DIAGNOSIS — D229 Melanocytic nevi, unspecified: Secondary | ICD-10-CM | POA: Diagnosis not present

## 2018-08-02 DIAGNOSIS — L578 Other skin changes due to chronic exposure to nonionizing radiation: Secondary | ICD-10-CM | POA: Diagnosis not present

## 2018-08-02 DIAGNOSIS — D2230 Melanocytic nevi of unspecified part of face: Secondary | ICD-10-CM | POA: Diagnosis not present

## 2018-08-02 DIAGNOSIS — L719 Rosacea, unspecified: Secondary | ICD-10-CM | POA: Diagnosis not present

## 2018-08-02 DIAGNOSIS — L812 Freckles: Secondary | ICD-10-CM | POA: Diagnosis not present

## 2018-08-30 ENCOUNTER — Encounter: Payer: Self-pay | Admitting: Physician Assistant

## 2018-08-30 DIAGNOSIS — F324 Major depressive disorder, single episode, in partial remission: Secondary | ICD-10-CM

## 2018-08-30 MED ORDER — ESCITALOPRAM OXALATE 20 MG PO TABS: 20.0000 mg | ORAL_TABLET | Freq: Every day | ORAL | 1 refills | Status: DC

## 2018-08-31 ENCOUNTER — Other Ambulatory Visit: Payer: Self-pay | Admitting: *Deleted

## 2018-08-31 DIAGNOSIS — F324 Major depressive disorder, single episode, in partial remission: Secondary | ICD-10-CM

## 2018-08-31 MED ORDER — ESCITALOPRAM OXALATE 20 MG PO TABS: 20.0000 mg | ORAL_TABLET | Freq: Every day | ORAL | 1 refills | Status: DC

## 2018-08-31 NOTE — Telephone Encounter (Signed)
Patient is requesting rx be resent to [facility].

## 2018-09-23 ENCOUNTER — Other Ambulatory Visit: Payer: Self-pay | Admitting: Physician Assistant

## 2018-09-23 DIAGNOSIS — I10 Essential (primary) hypertension: Secondary | ICD-10-CM

## 2018-11-03 DIAGNOSIS — Z13820 Encounter for screening for osteoporosis: Secondary | ICD-10-CM | POA: Diagnosis not present

## 2018-11-03 DIAGNOSIS — Z779 Other contact with and (suspected) exposures hazardous to health: Secondary | ICD-10-CM | POA: Diagnosis not present

## 2018-11-03 DIAGNOSIS — N762 Acute vulvitis: Secondary | ICD-10-CM | POA: Diagnosis not present

## 2018-11-03 DIAGNOSIS — Z6829 Body mass index (BMI) 29.0-29.9, adult: Secondary | ICD-10-CM | POA: Diagnosis not present

## 2018-11-03 DIAGNOSIS — Z01419 Encounter for gynecological examination (general) (routine) without abnormal findings: Secondary | ICD-10-CM | POA: Diagnosis not present

## 2018-11-03 DIAGNOSIS — Z1231 Encounter for screening mammogram for malignant neoplasm of breast: Secondary | ICD-10-CM | POA: Diagnosis not present

## 2018-11-03 DIAGNOSIS — I10 Essential (primary) hypertension: Secondary | ICD-10-CM | POA: Insufficient documentation

## 2018-12-01 ENCOUNTER — Encounter: Payer: Self-pay | Admitting: Physician Assistant

## 2018-12-01 DIAGNOSIS — F324 Major depressive disorder, single episode, in partial remission: Secondary | ICD-10-CM

## 2018-12-01 MED ORDER — ESCITALOPRAM OXALATE 20 MG PO TABS: 20.0000 mg | ORAL_TABLET | Freq: Every day | ORAL | 1 refills | Status: DC

## 2018-12-15 ENCOUNTER — Other Ambulatory Visit: Payer: Self-pay

## 2018-12-15 ENCOUNTER — Ambulatory Visit (INDEPENDENT_AMBULATORY_CARE_PROVIDER_SITE_OTHER): Payer: Managed Care, Other (non HMO)

## 2018-12-15 DIAGNOSIS — Z0000 Encounter for general adult medical examination without abnormal findings: Secondary | ICD-10-CM | POA: Diagnosis not present

## 2018-12-15 NOTE — Progress Notes (Signed)
Subjective:   [name] is a 68 y.o. female who presents for Medicare Annual (Subsequent) preventive examination.    This visit is being conducted through telemedicine due to the COVID-19 pandemic. This patient has given me verbal consent via doximity to conduct this visit, patient states they are participating from their home address. Some vital signs may be absent or patient reported.    Patient identification: identified by name, DOB, and current address  Review of Systems:  N/A  Cardiac Risk Factors include: advanced age (>17men, >59 women);dyslipidemia;hypertension     Objective:     Vitals: There were no vitals taken for this visit.  There is no height or weight on file to calculate BMI. Unable to obtain vitals due to visit being conducted via telephonically.   Advanced Directives 12/15/2018 12/10/2017 01/23/2015  Does Patient Have a Medical Advance Directive? No No No  Would patient like information on creating a medical advance directive? No - Patient declined No - Patient declined -    Tobacco Social History   Tobacco Use  Smoking Status Never Smoker  Smokeless Tobacco Never Used     Counseling given: Not Answered   Clinical Intake:  Pre-visit preparation completed: Yes  Pain : No/denies pain Pain Score: 0-No pain     Nutritional Status: BMI 25 -29 Overweight Nutritional Risks: Nausea/ vomitting/ diarrhea(Has had some diarrhea intermittently due to stress.) Diabetes: No  How often do you need to have someone help you when you read instructions, pamphlets, or other written materials from your doctor or pharmacy?: 1 - Never  Interpreter Needed?: No  Information entered by :: [facility], LPN  Past Medical History:  Diagnosis Date  . Allergy   . Anxiety   . Breast mass 10/07/2016   left 2:00  . Depression   . GERD (gastroesophageal reflux disease)    Past Surgical History:  Procedure Laterality Date  . ABDOMINAL HYSTERECTOMY  1988   complete;  due to fibroids  . BLADDER REPAIR  2007  . CHOLECYSTECTOMY  2003  . KNEE SURGERY Left 2012  . TONSILLECTOMY  1960   Family History  Problem Relation Age of Onset  . Cervical cancer Mother   . COPD Mother   . COPD Father   . Hypertension Father    Social History   Socioeconomic History  . Marital status: Married    Spouse name: Alyson Locket. Lissa Merlin  . Number of children: 2  . Years of education: Not on file  . Highest education level: Associate degree: occupational, Hotel manager, or vocational program  Occupational History  . Occupation: retired  Scientific laboratory technician  . Financial resource strain: Not hard at all  . Food insecurity:    Worry: Never true    Inability: Never true  . Transportation needs:    Medical: No    Non-medical: No  Tobacco Use  . Smoking status: Never Smoker  . Smokeless tobacco: Never Used  Substance and Sexual Activity  . Alcohol use: No  . Drug use: No  . Sexual activity: Not on file  Lifestyle  . Physical activity:    Days per week: 0 days    Minutes per session: 0 min  . Stress: Not at all  Relationships  . Social connections:    Talks on phone: Patient refused    Gets together: Patient refused    Attends religious service: Patient refused    Active member of club or organization: Patient refused    Attends meetings of clubs  or organizations: Patient refused    Relationship status: Patient refused  Other Topics Concern  . Not on file  Social History Narrative  . Not on file    Outpatient Encounter Medications as of 12/15/2018  Medication Sig  . amLODipine-benazepril (LOTREL) 5-10 MG capsule TAKE 1 CAPSULE DAILY  . Ascorbic Acid (VITAMIN C) 100 MG tablet Take by mouth daily.  . betamethasone valerate (VALISONE) 0.1 % cream Apply 1 application topically as needed (for vaginal itch).   . Biotin (BIOTIN 5000) 5 MG CAPS Take by mouth daily. 5000 mcg  . Cholecalciferol (VITAMIN D3) 2000 units TABS Take 2,000 Units by mouth daily.  Marland Kitchen escitalopram  (LEXAPRO) 20 MG tablet Take 1 tablet (20 mg total) by mouth daily.  . Multiple Vitamins-Minerals (MULTIVITAMIN WOMEN 50+ PO) Take by mouth daily.  . Omega-3 Fatty Acids (FISH OIL) 1200 MG CAPS Take 1,290 mg by mouth daily.  . Potassium 99 MG TABS Take 99 mg by mouth daily.  . SUPER B COMPLEX/C PO Take by mouth daily.  Marland Kitchen ALPRAZolam (XANAX) 0.5 MG tablet Take 1 tablet (0.5 mg total) by mouth 2 (two) times daily as needed for anxiety. Reported on 10/16/2015 (Patient not taking: Reported on 11/12/2017)  . amoxicillin-clavulanate (AUGMENTIN) 875-125 MG tablet Take 1 tablet by mouth 2 (two) times daily. (Patient not taking: Reported on 12/15/2018)  . Magnesium 400 MG CAPS Take by mouth daily.  . Potassium 75 MG TABS Take by mouth daily.  . predniSONE (DELTASONE) 10 MG tablet Take 6 tabs PO on day 1&2, 5 tabs PO on day 3&4, 4 tabs PO on day 5&6, 3 tabs PO on day 7&8, 2 tabs PO on day 9&10, 1 tab PO on day 11&12. (Patient not taking: Reported on 12/15/2018)   No facility-administered encounter medications on file as of 12/15/2018.     Activities of Daily Living In your present state of health, do you have any difficulty performing the following activities: 12/15/2018  Hearing? N  Vision? N  Difficulty concentrating or making decisions? N  Walking or climbing stairs? N  Dressing or bathing? N  Doing errands, shopping? N  Preparing Food and eating ? N  Using the Toilet? N  In the past six months, have you accidently leaked urine? N  Do you have problems with loss of bowel control? N  Managing your Medications? N  Managing your Finances? N  Housekeeping or managing your Housekeeping? N  Some recent data might be hidden    Patient Care Team: Mar Daring, PA-C as PCP - General (Family Medicine) Ralene Bathe, MD as Consulting Physician (Dermatology) Bryson Ha, OD as Consulting Physician (Optometry) Everlene Farrier, MD as Consulting Physician (Obstetrics and Gynecology)     Assessment:   This is a routine wellness examination for [name].  Exercise Activities and Dietary recommendations Current Exercise Habits: The patient does not participate in regular exercise at present(Does walk dog in neighborhood daily. ), Exercise limited by: None identified  Goals    . DIET - REDUCE PORTION SIZE     Recommend cutting portion sizes in half and eating 3 small meals a day with two healthy snacks a day.        Fall Risk: Fall Risk  12/15/2018 12/10/2017 01/23/2015  Falls in the past year? 0 Yes No  Number falls in past yr: - 1 -  Comment - accidentally stepped off of a small ledge -  Injury with Fall? - No -  Follow up -  Falls prevention discussed -    FALL RISK PREVENTION PERTAINING TO THE HOME:  Any stairs in or around the home? Yes  If so, are there any without handrails? Yes   Home free of loose throw rugs in walkways, pet beds, electrical cords, etc? Yes  Adequate lighting in your home to reduce risk of falls? Yes   ASSISTIVE DEVICES UTILIZED TO PREVENT FALLS:  Life alert? No  Use of a cane, walker or w/c? No  Grab bars in the bathroom? No  Shower chair or bench in shower? Yes  Elevated toilet seat or a handicapped toilet? Yes    TIMED UP AND GO:  Was the test performed? No .    Depression Screen PHQ 2/9 Scores 12/15/2018 02/01/2018 12/10/2017  PHQ - 2 Score 0 5 3  PHQ- 9 Score - 19 12     Cognitive Function: Declined screening today.         Immunization History  Administered Date(s) Administered  . Influenza, High Dose Seasonal PF 07/06/2017, 05/12/2018  . Pneumococcal Conjugate-13 05/12/2018  . Tdap 03/14/2009    Qualifies for Shingles Vaccine? Yes . Due for Shingrix. Education has been provided regarding the importance of this vaccine. Pt has been advised to call insurance company to determine out of pocket expense. Advised may also receive vaccine at local pharmacy or Health Dept. Verbalized acceptance and understanding.  Tdap: Up  to date  Flu Vaccine: Up to date  Pneumococcal Vaccine: Pneumovax 23 due 05/13/19  Screening Tests Health Maintenance  Topic Date Due  . TETANUS/TDAP  03/15/2019  . INFLUENZA VACCINE  03/19/2019  . COLONOSCOPY  04/17/2019  . PNA vac Low Risk Adult (2 of 2 - PPSV23) 05/13/2019  . MAMMOGRAM  11/02/2020  . DEXA SCAN  11/03/2023  . Hepatitis C Screening  Completed    Cancer Screenings:  Colorectal Screening: Completed 04/16/09. Repeat every 10 years.  Mammogram: Completed 11/03/18.   Bone Density: Completed 11/03/18. Results reflect OSTEOPENIA. Repeat every 5 years.   Lung Cancer Screening: (Low Dose CT Chest recommended if Age 7-80 years, 30 pack-year currently smoking OR have quit w/in 15years.) does not qualify.   Additional Screening:  Hepatitis C Screening: Up to date  Vision Screening: Recommended annual ophthalmology exams for early detection of glaucoma and other disorders of the eye.  Dental Screening: Recommended annual dental exams for proper oral hygiene  Community Resource Referral:  CRR required this visit?  No       Plan:  I have personally reviewed and addressed the Medicare Annual Wellness questionnaire and have noted the following in the patient's chart:  A. Medical and social history B. Use of alcohol, tobacco or illicit drugs  C. Current medications and supplements D. Functional ability and status E.  Nutritional status F.  Physical activity G. Advance directives H. List of other physicians I.  Hospitalizations, surgeries, and ER visits in previous 12 months J.  [place] such as hearing and vision if needed, cognitive and depression L. Referrals and appointments   In addition, I have reviewed and discussed with patient certain preventive protocols, quality metrics, and best practice recommendations. A written personalized care plan for preventive services as well as general preventive health recommendations were provided to patient.  Nurse Health Advisor  Signed,    Salisa Broz [place], [place]  [id number] Nurse Health Advisor   Nurse Notes: None.

## 2018-12-15 NOTE — Patient Instructions (Addendum)
[name] , Thank you for taking time to come for your Medicare Wellness Visit. I appreciate your ongoing commitment to your health goals. Please review the following plan we discussed and let me know if I can assist you in the future.   Screening recommendations/referrals: Colonoscopy: Up to date, due 04/17/19 Mammogram: Up to date, 10/2019 Bone Density: Up to date, due 11/03/2023 Recommended yearly ophthalmology/optometry visit for glaucoma screening and checkup Recommended yearly dental visit for hygiene and checkup  Vaccinations: Influenza vaccine: Up to date Pneumococcal vaccine: Pneumovax 23 due 05/13/19  Tdap vaccine: Up to date, due 02/2019 Shingles vaccine: Pt declines today.     Advanced directives: Advance directive discussed with you today. Even though you declined this today please call our office should you change your mind and we can give you the proper paperwork for you to fill out.  Conditions/risks identified: None.   Next appointment: 03/04/19 @ 9:00 AM with Fenton Malling.    Preventive Care 81 Years and Older, Female Preventive care refers to lifestyle choices and visits with your health care provider that can promote health and wellness. What does preventive care include?  A yearly physical exam. This is also called an annual well check.  Dental exams once or twice a year.  Routine eye exams. Ask your health care provider how often you should have your eyes checked.  Personal lifestyle choices, including:  Daily care of your teeth and gums.  Regular physical activity.  Eating a healthy diet.  Avoiding tobacco and drug use.  Limiting alcohol use.  Practicing safe sex.  Taking low-dose aspirin every day.  Taking vitamin and mineral supplements as recommended by your health care provider. What happens during an annual well check? The services and screenings done by your health care provider during your annual well check will depend on your age,  overall health, lifestyle risk factors, and family history of disease. Counseling  Your health care provider may ask you questions about your:  Alcohol use.  Tobacco use.  Drug use.  Emotional well-being.  Home and relationship well-being.  Sexual activity.  Eating habits.  History of falls.  Memory and ability to understand (cognition).  Work and work Statistician.  Reproductive health. Screening  You may have the following tests or measurements:  Height, weight, and BMI.  Blood pressure.  Lipid and cholesterol levels. These may be checked every 5 years, or more frequently if you are over 69 years old.  Skin check.  Lung cancer screening. You may have this screening every year starting at age 69 if you have a 30-pack-year history of smoking and currently smoke or have quit within the past 15 years.  Fecal occult blood test (FOBT) of the stool. You may have this test every year starting at age 69.  Flexible sigmoidoscopy or colonoscopy. You may have a sigmoidoscopy every 5 years or a colonoscopy every 10 years starting at age 69.  Hepatitis C blood test.  Hepatitis B blood test.  Sexually transmitted disease (STD) testing.  Diabetes screening. This is done by checking your blood sugar (glucose) after you have not eaten for a while (fasting). You may have this done every 1-3 years.  Bone density scan. This is done to screen for osteoporosis. You may have this done starting at age 69.  Mammogram. This may be done every 1-2 years. Talk to your health care provider about how often you should have regular mammograms. Talk with your health care provider about your test results,  treatment options, and if necessary, the need for more tests. Vaccines  Your health care provider may recommend certain vaccines, such as:  Influenza vaccine. This is recommended every year.  Tetanus, diphtheria, and acellular pertussis (Tdap, Td) vaccine. You may need a Td booster every 10  years.  Zoster vaccine. You may need this after age 69.  Pneumococcal 13-valent conjugate (PCV13) vaccine. One dose is recommended after age 69.  Pneumococcal polysaccharide (PPSV23) vaccine. One dose is recommended after age 69. Talk to your health care provider about which screenings and vaccines you need and how often you need them. This information is not intended to replace advice given to you by your health care provider. Make sure you discuss any questions you have with your health care provider. Document Released: 08/31/2015 Document Revised: 04/23/2016 Document Reviewed: 06/05/2015 Elsevier Interactive Patient Education  2017 [place] Prevention in the Home Falls can cause injuries. They can happen to people of all ages. There are many things you can do to make your home safe and to help prevent falls. What can I do on the outside of my home?  Regularly fix the edges of walkways and driveways and fix any cracks.  Remove anything that might make you trip as you walk through a door, such as a raised step or threshold.  Trim any bushes or trees on the path to your home.  Use bright outdoor lighting.  Clear any walking paths of anything that might make someone trip, such as rocks or tools.  Regularly check to see if handrails are loose or broken. Make sure that both sides of any steps have handrails.  Any raised decks and porches should have guardrails on the edges.  Have any leaves, snow, or ice cleared regularly.  Use sand or salt on walking paths during winter.  Clean up any spills in your garage right away. This includes oil or grease spills. What can I do in the bathroom?  Use night lights.  Install grab bars by the toilet and in the tub and shower. Do not use towel bars as grab bars.  Use non-skid mats or decals in the tub or shower.  If you need to sit down in the shower, use a plastic, non-slip stool.  Keep the floor dry. Clean up any water that  spills on the floor as soon as it happens.  Remove soap buildup in the tub or shower regularly.  Attach bath mats securely with double-sided non-slip rug tape.  Do not have throw rugs and other things on the floor that can make you trip. What can I do in the bedroom?  Use night lights.  Make sure that you have a light by your bed that is easy to reach.  Do not use any sheets or blankets that are too big for your bed. They should not hang down onto the floor.  Have a firm chair that has side arms. You can use this for support while you get dressed.  Do not have throw rugs and other things on the floor that can make you trip. What can I do in the kitchen?  Clean up any spills right away.  Avoid walking on wet floors.  Keep items that you use a lot in easy-to-reach places.  If you need to reach something above you, use a strong step stool that has a grab bar.  Keep electrical cords out of the way.  Do not use floor polish or wax that makes floors  slippery. If you must use wax, use non-skid floor wax.  Do not have throw rugs and other things on the floor that can make you trip. What can I do with my stairs?  Do not leave any items on the stairs.  Make sure that there are handrails on both sides of the stairs and use them. Fix handrails that are broken or loose. Make sure that handrails are as long as the stairways.  Check any carpeting to make sure that it is firmly attached to the stairs. Fix any carpet that is loose or worn.  Avoid having throw rugs at the top or bottom of the stairs. If you do have throw rugs, attach them to the floor with carpet tape.  Make sure that you have a light switch at the top of the stairs and the bottom of the stairs. If you do not have them, ask someone to add them for you. What else can I do to help prevent falls?  Wear shoes that:  Do not have high heels.  Have rubber bottoms.  Are comfortable and fit you well.  Are closed at the  toe. Do not wear sandals.  If you use a stepladder:  Make sure that it is fully opened. Do not climb a closed stepladder.  Make sure that both sides of the stepladder are locked into place.  Ask someone to hold it for you, if possible.  Clearly mark and make sure that you can see:  Any grab bars or handrails.  First and last steps.  Where the edge of each step is.  Use tools that help you move around (mobility aids) if they are needed. These include:  Canes.  Walkers.  Scooters.  Crutches.  Turn on the lights when you go into a dark area. Replace any light bulbs as soon as they burn out.  Set up your furniture so you have a clear path. Avoid moving your furniture around.  If any of your floors are uneven, fix them.  If there are any pets around you, be aware of where they are.  Review your medicines with your doctor. Some medicines can make you feel dizzy. This can increase your chance of falling. Ask your doctor what other things that you can do to help prevent falls. This information is not intended to replace advice given to you by your health care provider. Make sure you discuss any questions you have with your health care provider. Document Released: 05/31/2009 Document Revised: 01/10/2016 Document Reviewed: 09/08/2014 Elsevier Interactive Patient Education  2017 [employer].

## 2019-03-03 NOTE — Progress Notes (Deleted)
Patient: [name], Female    DOB: [birth year], 68 y.o.   MRN: [id number] Visit Date: 03/04/2019  Today's Provider: Mar Daring, PA-C   Chief Complaint  Patient presents with  . Follow-up   Subjective:  I,Joseline E. Rosas,RMA am acting as a Education administrator for [employer], PA-C.  Patient had AWV with [facility] 12/15/2018.  HTN: Stable. Taking amlodipine-benazepril. Diet is well balanced. She is exercising,walks her [place] everyday.  Depression: Stable. Takes Lexapro10mg .  Hypercholesterolemia: Diet controlled.  Review of Systems  Constitutional: Negative.  Negative for appetite change, chills, fatigue and fever.  HENT: Negative.   Eyes: Negative.   Respiratory: Negative for chest tightness and shortness of breath.   Cardiovascular: Negative for chest pain and palpitations.  Gastrointestinal: Negative for abdominal pain, nausea and vomiting.  Endocrine: Negative.   Genitourinary: Negative.   Musculoskeletal: Negative.   Skin: Negative.   Allergic/Immunologic: Negative.   Neurological: Negative for dizziness and weakness.  Hematological: Negative.   Psychiatric/Behavioral: Negative.     Social History   Socioeconomic History  . Marital status: Married    Spouse name: Alyson Locket. Lissa Merlin  . Number of children: 2  . Years of education: Not on file  . Highest education level: Associate degree: occupational, Hotel manager, or vocational program  Occupational History  . Occupation: retired  Scientific laboratory technician  . Financial resource strain: Not hard at all  . Food insecurity    Worry: Never true    Inability: Never true  . Transportation needs    Medical: No    Non-medical: No  Tobacco Use  . Smoking status: Never Smoker  . Smokeless tobacco: Never Used  Substance and Sexual Activity  . Alcohol use: No  . Drug use: No  . Sexual activity: Not on file  Lifestyle  . Physical activity    Days per week: 0 days    Minutes per session: 0 min  . Stress: Not at all   Relationships  . Social Herbalist on phone: Patient refused    Gets together: Patient refused    Attends religious service: Patient refused    Active member of club or organization: Patient refused    Attends meetings of clubs or organizations: Patient refused    Relationship status: Patient refused  . Intimate partner violence    Fear of current or ex partner: Patient refused    Emotionally abused: Patient refused    Physically abused: Patient refused    Forced sexual activity: Patient refused  Other Topics Concern  . Not on file  Social History Narrative  . Not on file    Past Medical History:  Diagnosis Date  . Allergy   . Anxiety   . Breast mass 10/07/2016   left 2:00  . Depression   . GERD (gastroesophageal reflux disease)      Patient Active Problem List   Diagnosis Date Noted  . Hypercholesterolemia 12/10/2017  . Allergic rhinitis, seasonal 01/23/2015  . Anxiety 01/23/2015  . Benign essential HTN 01/23/2015  . Clinical depression 01/23/2015  . Pharyngeal inflammation 01/23/2015  . Seasonal affective disorder ([place]) 01/23/2015  . Abnormal chest x-ray 01/23/2015    Past Surgical History:  Procedure Laterality Date  . ABDOMINAL HYSTERECTOMY  1988   complete; due to fibroids  . BLADDER REPAIR  2007  . CHOLECYSTECTOMY  2003  . KNEE SURGERY Left 2012  . TONSILLECTOMY  1960    Her family history includes COPD in  her father and mother; Cervical cancer in her mother; Hypertension in her father.   Current Outpatient Medications:  .  ALPRAZolam (XANAX) 0.5 MG tablet, Take 1 tablet (0.5 mg total) by mouth 2 (two) times daily as needed for anxiety. Reported on 10/16/2015, Disp: 60 tablet, Rfl: 5 .  amLODipine-benazepril (LOTREL) 5-10 MG capsule, TAKE 1 CAPSULE DAILY, Disp: 90 capsule, Rfl: 4 .  betamethasone valerate (VALISONE) 0.1 % cream, Apply 1 application topically as needed (for vaginal itch). , Disp: , Rfl:  .  Cholecalciferol (VITAMIN D3) 2000  units TABS, Take 2,000 Units by mouth daily., Disp: , Rfl:  .  escitalopram (LEXAPRO) 20 MG tablet, Take 1 tablet (20 mg total) by mouth daily., Disp: 90 tablet, Rfl: 1 .  Multiple Vitamins-Minerals (MULTIVITAMIN WOMEN 50+ PO), Take by mouth daily., Disp: , Rfl:  .  Omega-3 Fatty Acids (FISH OIL) 1200 MG CAPS, Take 1,290 mg by mouth daily., Disp: , Rfl:  .  Potassium 75 MG TABS, Take by mouth daily., Disp: , Rfl:  .  Potassium 99 MG TABS, Take 99 mg by mouth daily., Disp: , Rfl:  .  SUPER B COMPLEX/C PO, Take by mouth daily., Disp: , Rfl:  .  Ascorbic Acid (VITAMIN C) 100 MG tablet, Take by mouth daily., Disp: , Rfl:   Patient Care Team: Mar Daring, PA-C as PCP - General (Family Medicine) Ralene Bathe, MD as Consulting Physician (Dermatology) Bryson Ha, OD as Consulting Physician (Optometry) Everlene Farrier, MD as Consulting Physician (Obstetrics and Gynecology)     Objective:    Vitals: BP (!) 156/76 (BP Location: Left Arm, Patient Position: Sitting, Cuff Size: Normal)   Pulse 63   Temp 98.3 F (36.8 C) (Oral)   Resp 16   Wt 162 lb 12.8 oz (73.8 kg)   BMI 28.84 kg/m    Activities of Daily Living In your present state of health, do you have any difficulty performing the following activities: 12/15/2018  Hearing? N  Vision? N  Difficulty concentrating or making decisions? N  Walking or climbing stairs? N  Dressing or bathing? N  Doing errands, shopping? N  Preparing Food and eating ? N  Using the Toilet? N  In the past six months, have you accidently leaked urine? N  Do you have problems with loss of bowel control? N  Managing your Medications? N  Managing your Finances? N  Housekeeping or managing your Housekeeping? N  Some recent data might be hidden    Fall Risk Assessment Fall Risk  12/15/2018 12/10/2017 01/23/2015  Falls in the past year? 0 Yes No  Number falls in past yr: - 1 -  Comment - accidentally stepped off of a small ledge -  Injury with  Fall? - No -  Follow up - Falls prevention discussed -     Depression Screen PHQ 2/9 Scores 12/15/2018 02/01/2018 12/10/2017  PHQ - 2 Score 0 5 3  PHQ- 9 Score - 19 12    No flowsheet data found.     Assessment & Plan:    Reviewed patient's Family Medical History Reviewed and updated list of patient's medical providers Assessment of cognitive impairment was done Assessed patient's functional ability Established a written schedule for health screening [place] Completed and Reviewed  Exercise Activities and Dietary recommendations Goals    . DIET - REDUCE PORTION SIZE     Recommend cutting portion sizes in half and eating 3 small meals a day with two  healthy snacks a day.        Immunization History  Administered Date(s) Administered  . Influenza, High Dose Seasonal PF 07/06/2017, 05/12/2018  . Pneumococcal Conjugate-13 05/12/2018  . Tdap 03/14/2009    Health Maintenance  Topic Date Due  . Samul Dada  03/15/2019  . INFLUENZA VACCINE  03/19/2019  . COLONOSCOPY  04/17/2019  . PNA vac Low Risk Adult (2 of 2 - PPSV23) 05/13/2019  . MAMMOGRAM  11/02/2020  . DEXA SCAN  11/03/2023  . Hepatitis C Screening  Completed     Discussed health benefits of physical activity, and encouraged her to engage in regular exercise appropriate for her age and condition.    ------------------------------------------------------------------------------------------------------------     Mar Daring, PA-C  [place]

## 2019-03-04 ENCOUNTER — Encounter: Payer: Self-pay | Admitting: Physician Assistant

## 2019-03-04 ENCOUNTER — Ambulatory Visit (INDEPENDENT_AMBULATORY_CARE_PROVIDER_SITE_OTHER): Payer: Managed Care, Other (non HMO) | Admitting: Physician Assistant

## 2019-03-04 ENCOUNTER — Other Ambulatory Visit: Payer: Self-pay

## 2019-03-04 VITALS — BP 156/76 | HR 63 | Temp 98.3°F | Resp 16 | Wt 162.8 lb

## 2019-03-04 DIAGNOSIS — Z1211 Encounter for screening for malignant neoplasm of colon: Secondary | ICD-10-CM

## 2019-03-04 DIAGNOSIS — F338 Other recurrent depressive disorders: Secondary | ICD-10-CM

## 2019-03-04 DIAGNOSIS — I10 Essential (primary) hypertension: Secondary | ICD-10-CM

## 2019-03-04 DIAGNOSIS — E7800 Pure hypercholesterolemia, unspecified: Secondary | ICD-10-CM | POA: Diagnosis not present

## 2019-03-04 MED ORDER — ESCITALOPRAM OXALATE 20 MG PO TABS: 20.0000 mg | ORAL_TABLET | Freq: Every day | ORAL | 3 refills | Status: DC

## 2019-03-04 NOTE — Patient Instructions (Signed)
Health Maintenance After Age 69 After age 69, you are at a higher risk for certain long-term diseases and infections as well as injuries from falls. Falls are a major cause of broken bones and head injuries in people who are older than age 69. Getting regular preventive care can help to keep you healthy and well. Preventive care includes getting regular testing and making lifestyle changes as recommended by your health care provider. Talk with your health care provider about:  Which screenings and tests you should have. A screening is a test that checks for a disease when you have no symptoms.  A diet and exercise plan that is right for you. What should I know about screenings and tests to prevent falls? Screening and testing are the best ways to find a health problem early. Early diagnosis and treatment give you the best chance of managing medical conditions that are common after age 69. Certain conditions and lifestyle choices may make you more likely to have a fall. Your health care provider may recommend:  Regular vision checks. Poor vision and conditions such as cataracts can make you more likely to have a fall. If you wear glasses, make sure to get your prescription updated if your vision changes.  Medicine review. Work with your health care provider to regularly review all of the medicines you are taking, including over-the-counter medicines. Ask your health care provider about any side effects that may make you more likely to have a fall. Tell your health care provider if any medicines that you take make you feel dizzy or sleepy.  Osteoporosis screening. Osteoporosis is a condition that causes the bones to get weaker. This can make the bones weak and cause them to break more easily.  Blood pressure screening. Blood pressure changes and medicines to control blood pressure can make you feel dizzy.  Strength and balance checks. Your health care provider may recommend certain tests to check your  strength and balance while standing, walking, or changing positions.  Foot health exam. Foot pain and numbness, as well as not wearing proper footwear, can make you more likely to have a fall.  Depression screening. You may be more likely to have a fall if you have a fear of falling, feel emotionally low, or feel unable to do activities that you used to do.  Alcohol use screening. Using too much alcohol can affect your balance and may make you more likely to have a fall. What actions can I take to lower my risk of falls? General instructions  Talk with your health care provider about your risks for falling. Tell your health care provider if: ? You fall. Be sure to tell your health care provider about all falls, even ones that seem minor. ? You feel dizzy, sleepy, or off-balance.  Take over-the-counter and prescription medicines only as told by your health care provider. These include any supplements.  Eat a healthy diet and maintain a healthy weight. A healthy diet includes low-fat dairy products, low-fat (lean) meats, and fiber from whole grains, beans, and lots of fruits and vegetables. Home safety  Remove any tripping hazards, such as rugs, cords, and clutter.  Install safety equipment such as grab bars in bathrooms and safety rails on stairs.  Keep rooms and walkways well-lit. Activity   Follow a regular exercise program to stay fit. This will help you maintain your balance. Ask your health care provider what types of exercise are appropriate for you.  If you need a cane or   walker, use it as recommended by your health care provider.  Wear supportive shoes that have nonskid soles. Lifestyle  Do not drink alcohol if your health care provider tells you not to drink.  If you drink alcohol, limit how much you have: ? 0-1 drink a day for women. ? 0-2 drinks a day for men.  Be aware of how much alcohol is in your drink. In the [place] one drink equals one typical bottle of beer (12  oz), one-half glass of wine (5 oz), or one shot of hard liquor (1 oz).  Do not use any products that contain nicotine or tobacco, such as cigarettes and e-cigarettes. If you need help quitting, ask your health care provider. Summary  Having a healthy lifestyle and getting preventive care can help to protect your health and wellness after age 69.  Screening and testing are the best way to find a health problem early and help you avoid having a fall. Early diagnosis and treatment give you the best chance for managing medical conditions that are more common for people who are older than age 69.  Falls are a major cause of broken bones and head injuries in people who are older than age 69. Take precautions to prevent a fall at home.  Work with your health care provider to learn what changes you can make to improve your health and wellness and to prevent falls. This information is not intended to replace advice given to you by your health care provider. Make sure you discuss any questions you have with your health care provider. Document Released: 06/17/2017 Document Revised: 11/25/2018 Document Reviewed: 06/17/2017 Elsevier Patient Education  2020 Elsevier Inc.  

## 2019-03-04 NOTE — Progress Notes (Signed)
Patient: [name], Female    DOB: Nov 03, 1950, 68 y.o.   MRN: [id number] Visit Date: 03/04/2019  Today's Provider: Mar Daring, PA-C   Chief Complaint  Patient presents with  . Follow-up   Subjective:     Had AWV with [facility] on 12/15/2018  [name] is a 69 yr old female here today for her annual follow up on HTN, depression (seasonal affective disorder), hypercholesterolemia. She reports she is overall feeling great and feels the best she has felt since her granddaughter passed 3-4 years ago. She is active and plays golf 1-2 times per week and walks her dog, Gus, daily. She is excited because she is getting a new dog tomorrow, a sheltie puppy named [place]. She is sleeping well. She has no complaints. -----------------------------------------------------------   Review of Systems  Constitutional: Negative.   HENT: Negative.   Eyes: Negative.   Respiratory: Negative.   Cardiovascular: Negative.   Gastrointestinal: Negative.   Endocrine: Negative.   Genitourinary: Negative.   Musculoskeletal: Negative.   Skin: Negative.   Allergic/Immunologic: Negative.   Neurological: Negative.   Hematological: Negative.   Psychiatric/Behavioral: Negative.     Social History   Socioeconomic History  . Marital status: Married    Spouse name: Alyson Locket. Lissa Merlin  . Number of children: 2  . Years of education: Not on file  . Highest education level: Associate degree: occupational, Hotel manager, or vocational program  Occupational History  . Occupation: retired  Scientific laboratory technician  . Financial resource strain: Not hard at all  . Food insecurity    Worry: Never true    Inability: Never true  . Transportation needs    Medical: No    Non-medical: No  Tobacco Use  . Smoking status: Never Smoker  . Smokeless tobacco: Never Used  Substance and Sexual Activity  . Alcohol use: No  . Drug use: No  . Sexual activity: Not on file  Lifestyle  . Physical activity    Days per  week: 0 days    Minutes per session: 0 min  . Stress: Not at all  Relationships  . Social Herbalist on phone: Patient refused    Gets together: Patient refused    Attends religious service: Patient refused    Active member of club or organization: Patient refused    Attends meetings of clubs or organizations: Patient refused    Relationship status: Patient refused  . Intimate partner violence    Fear of current or ex partner: Patient refused    Emotionally abused: Patient refused    Physically abused: Patient refused    Forced sexual activity: Patient refused  Other Topics Concern  . Not on file  Social History Narrative  . Not on file    Past Medical History:  Diagnosis Date  . Allergy   . Anxiety   . Breast mass 10/07/2016   left 2:00  . Depression   . GERD (gastroesophageal reflux disease)      Patient Active Problem List   Diagnosis Date Noted  . Hypercholesterolemia 12/10/2017  . Allergic rhinitis, seasonal 01/23/2015  . Anxiety 01/23/2015  . Benign essential HTN 01/23/2015  . Clinical depression 01/23/2015  . Pharyngeal inflammation 01/23/2015  . Seasonal affective disorder ([place]) 01/23/2015  . Abnormal chest x-ray 01/23/2015    Past Surgical History:  Procedure Laterality Date  . ABDOMINAL HYSTERECTOMY  1988   complete; due to fibroids  . BLADDER REPAIR  2007  .  CHOLECYSTECTOMY  2003  . KNEE SURGERY Left 2012  . TONSILLECTOMY  1960    Her family history includes COPD in her father and mother; Cervical cancer in her mother; Hypertension in her father.   Current Outpatient Medications:  .  ALPRAZolam (XANAX) 0.5 MG tablet, Take 1 tablet (0.5 mg total) by mouth 2 (two) times daily as needed for anxiety. Reported on 10/16/2015, Disp: 60 tablet, Rfl: 5 .  amLODipine-benazepril (LOTREL) 5-10 MG capsule, TAKE 1 CAPSULE DAILY, Disp: 90 capsule, Rfl: 4 .  betamethasone valerate (VALISONE) 0.1 % cream, Apply 1 application topically as needed (for  vaginal itch). , Disp: , Rfl:  .  Cholecalciferol (VITAMIN D3) 2000 units TABS, Take 2,000 Units by mouth daily., Disp: , Rfl:  .  escitalopram (LEXAPRO) 20 MG tablet, Take 1 tablet (20 mg total) by mouth daily., Disp: 90 tablet, Rfl: 3 .  Multiple Vitamins-Minerals (MULTIVITAMIN WOMEN 50+ PO), Take by mouth daily., Disp: , Rfl:  .  Omega-3 Fatty Acids (FISH OIL) 1200 MG CAPS, Take 1,290 mg by mouth daily., Disp: , Rfl:  .  Potassium 75 MG TABS, Take by mouth daily., Disp: , Rfl:  .  Potassium 99 MG TABS, Take 99 mg by mouth daily., Disp: , Rfl:  .  SUPER B COMPLEX/C PO, Take by mouth daily., Disp: , Rfl:  .  Ascorbic Acid (VITAMIN C) 100 MG tablet, Take by mouth daily., Disp: , Rfl:   Patient Care Team: Mar Daring, PA-C as PCP - General (Family Medicine) Ralene Bathe, MD as Consulting Physician (Dermatology) Bryson Ha, OD as Consulting Physician (Optometry) Everlene Farrier, MD as Consulting Physician (Obstetrics and Gynecology)     Objective:    Vitals: BP (!) 156/76 (BP Location: Left Arm, Patient Position: Sitting, Cuff Size: Normal)   Pulse 63   Temp 98.3 F (36.8 C) (Oral)   Resp 16   Wt 162 lb 12.8 oz (73.8 kg)   BMI 28.84 kg/m   Physical Exam Vitals signs reviewed.  Constitutional:      General: She is not in acute distress.    Appearance: Normal appearance. She is well-developed and normal weight. She is not ill-appearing or diaphoretic.  HENT:     Head: Normocephalic and atraumatic.     Right Ear: Tympanic membrane, ear canal and external ear normal.     Left Ear: Tympanic membrane, ear canal and external ear normal.     Nose: Nose normal.     Mouth/Throat:     Mouth: Mucous membranes are moist.     Pharynx: No oropharyngeal exudate.  Eyes:     General: No scleral icterus.       Right eye: No discharge.        Left eye: No discharge.     Extraocular Movements: Extraocular movements intact.     Conjunctiva/sclera: Conjunctivae normal.      Pupils: Pupils are equal, round, and reactive to light.  Neck:     Musculoskeletal: Normal range of motion and neck supple.     Thyroid: No thyromegaly.     Vascular: No carotid bruit or JVD.     Trachea: No tracheal deviation.  Cardiovascular:     Rate and Rhythm: Normal rate and regular rhythm.     Pulses: Normal pulses.     Heart sounds: Normal heart sounds. No murmur. No friction rub. No gallop.   Pulmonary:     Effort: Pulmonary effort is normal. No respiratory distress.  Breath sounds: Normal breath sounds. No wheezing or rales.  Chest:     Chest wall: No tenderness.  Abdominal:     General: Abdomen is flat. Bowel sounds are normal. There is no distension.     Palpations: Abdomen is soft. There is no mass.     Tenderness: There is no abdominal tenderness. There is no guarding or rebound.  Musculoskeletal: Normal range of motion.        General: No tenderness.  Lymphadenopathy:     Cervical: No cervical adenopathy.  Skin:    General: Skin is warm and dry.     Capillary Refill: Capillary refill takes less than 2 seconds.     Findings: No rash.  Neurological:     General: No focal deficit present.     Mental Status: She is alert and oriented to person, place, and time. Mental status is at baseline.     Cranial Nerves: No cranial nerve deficit.     Motor: No weakness.     Coordination: Coordination normal.     Gait: Gait normal.  Psychiatric:        Mood and Affect: Mood normal.        Behavior: Behavior normal.        Thought Content: Thought content normal.        Judgment: Judgment normal.     Activities of Daily Living In your present state of health, do you have any difficulty performing the following activities: 12/15/2018  Hearing? N  Vision? N  Difficulty concentrating or making decisions? N  Walking or climbing stairs? N  Dressing or bathing? N  Doing errands, shopping? N  Preparing Food and eating ? N  Using the Toilet? N  In the past six months, have  you accidently leaked urine? N  Do you have problems with loss of bowel control? N  Managing your Medications? N  Managing your Finances? N  Housekeeping or managing your Housekeeping? N  Some recent data might be hidden    Fall Risk Assessment Fall Risk  12/15/2018 12/10/2017 01/23/2015  Falls in the past year? 0 Yes No  Number falls in past yr: - 1 -  Comment - accidentally stepped off of a small ledge -  Injury with Fall? - No -  Follow up - Falls prevention discussed -     Depression Screen PHQ 2/9 Scores 03/04/2019 12/15/2018 02/01/2018 12/10/2017  PHQ - 2 Score 0 0 5 3  PHQ- 9 Score 1 - 19 12    No flowsheet data found.     Assessment & Plan:    Annual Physical Reviewed patient's Family Medical History Reviewed and updated list of patient's medical providers Assessment of cognitive impairment was done Assessed patient's functional ability Established a written schedule for health screening [place] Completed and Reviewed  Exercise Activities and Dietary recommendations Goals    . DIET - REDUCE PORTION SIZE     Recommend cutting portion sizes in half and eating 3 small meals a day with two healthy snacks a day.        Immunization History  Administered Date(s) Administered  . Influenza, High Dose Seasonal PF 07/06/2017, 05/12/2018  . Pneumococcal Conjugate-13 05/12/2018  . Tdap 03/14/2009    Health Maintenance  Topic Date Due  . Samul Dada  03/15/2019  . INFLUENZA VACCINE  03/19/2019  . COLONOSCOPY  04/17/2019  . PNA vac Low Risk Adult (2 of 2 - PPSV23) 05/13/2019  . MAMMOGRAM  11/02/2020  .  DEXA SCAN  11/03/2023  . Hepatitis C Screening  Completed     Discussed health benefits of physical activity, and encouraged her to engage in regular exercise appropriate for her age and condition.    1. Colon cancer screening Due for repeat colonoscopy in August. Last done in 2010 at [facility]. Previous provider has retired but  patient ok to continue to have colonoscopies there. Referral placed.  - Ambulatory referral to Gastroenterology  2. Benign essential HTN Stable. Continue Amlodipine-benazepril 5-10mg . Will check labs as below and f/u pending results. - CBC with Differential/Platelet - Comprehensive metabolic panel - Lipid panel - TSH  3. Hypercholesterolemia Diet controlled. Will check labs as below and f/u pending results. - CBC with Differential/Platelet - Comprehensive metabolic panel - Lipid panel - TSH  4. Seasonal affective disorder ([facility]) Stable. Diagnosis pulled for medication refill. Continue current medical treatment plan. Will check labs as below and f/u pending results. - TSH - escitalopram (LEXAPRO) 20 MG tablet; Take 1 tablet (20 mg total) by mouth daily.  Dispense: 90 tablet; Refill: 3  ------------------------------------------------------------------------------------------------------------    Mar Daring, PA-C  [facility]

## 2019-03-05 LAB — COMPREHENSIVE METABOLIC PANEL
ALT: 15 IU/L (ref 0–32)
AST: 20 IU/L (ref 0–40)
Albumin/Globulin Ratio: 1.9 (ref 1.2–2.2)
Albumin: 4.4 g/dL (ref 3.8–4.8)
Alkaline Phosphatase: 80 IU/L (ref 39–117)
BUN/Creatinine Ratio: 15 (ref 12–28)
BUN: 16 mg/dL (ref 8–27)
Bilirubin Total: 0.7 mg/dL (ref 0.0–1.2)
CO2: 21 mmol/L (ref 20–29)
Calcium: 9.4 mg/dL (ref 8.7–10.3)
Chloride: 105 mmol/L (ref 96–106)
Creatinine, Ser: 1.06 mg/dL — ABNORMAL HIGH (ref 0.57–1.00)
GFR calc Af Amer: 62 mL/min/{1.73_m2} (ref >59)
GFR calc non Af Amer: 54 mL/min/{1.73_m2} — ABNORMAL LOW (ref >59)
Globulin, Total: 2.3 g/dL (ref 1.5–4.5)
Glucose: 86 mg/dL (ref 65–99)
Potassium: 4.6 mmol/L (ref 3.5–5.2)
Sodium: 142 mmol/L (ref 134–144)
Total Protein: 6.7 g/dL (ref 6.0–8.5)

## 2019-03-05 LAB — LIPID PANEL
Chol/HDL Ratio: 3.4 ratio (ref 0.0–4.4)
Cholesterol, Total: 217 mg/dL — ABNORMAL HIGH (ref 100–199)
HDL: 63 mg/dL (ref >39)
LDL Calculated: 126 mg/dL — ABNORMAL HIGH (ref 0–99)
Triglycerides: 140 mg/dL (ref 0–149)
VLDL Cholesterol Cal: 28 mg/dL (ref 5–40)

## 2019-03-05 LAB — CBC WITH DIFFERENTIAL/PLATELET
Basophils Absolute: 0.1 10*3/uL (ref 0.0–0.2)
Basos: 1 %
EOS (ABSOLUTE): 0.2 10*3/uL (ref 0.0–0.4)
Eos: 3 %
Hematocrit: 41.5 % (ref 34.0–46.6)
Hemoglobin: 14 g/dL (ref 11.1–15.9)
Immature Grans (Abs): 0 10*3/uL (ref 0.0–0.1)
Immature Granulocytes: 0 %
Lymphocytes Absolute: 1.6 10*3/uL (ref 0.7–3.1)
Lymphs: 28 %
MCH: 28.4 pg (ref 26.6–33.0)
MCHC: 33.7 g/dL (ref 31.5–35.7)
MCV: 84 fL (ref 79–97)
Monocytes Absolute: 0.5 10*3/uL (ref 0.1–0.9)
Monocytes: 9 %
Neutrophils Absolute: 3.3 10*3/uL (ref 1.4–7.0)
Neutrophils: 59 %
Platelets: 282 10*3/uL (ref 150–450)
RBC: 4.93 x10E6/uL (ref 3.77–5.28)
RDW: 13.1 % (ref 11.7–15.4)
WBC: 5.6 10*3/uL (ref 3.4–10.8)

## 2019-03-05 LAB — TSH: TSH: 1.89 u[IU]/mL (ref 0.450–4.500)

## 2019-03-07 ENCOUNTER — Telehealth: Payer: Self-pay

## 2019-03-07 DIAGNOSIS — M25561 Pain in right knee: Secondary | ICD-10-CM | POA: Diagnosis not present

## 2019-03-07 NOTE — Telephone Encounter (Signed)
-----   Message from Mar Daring, [place] sent at 03/07/2019 10:07 AM EDT ----- Blood count is normal. Kidney and liver function is normal. Cholesterol slightly up from last year. Thyroid is normal.

## 2019-03-07 NOTE — Telephone Encounter (Signed)
Patient has been advised. KW 

## 2019-03-14 ENCOUNTER — Encounter: Payer: Self-pay | Admitting: Gastroenterology

## 2019-03-14 DIAGNOSIS — M25561 Pain in right knee: Secondary | ICD-10-CM | POA: Diagnosis not present

## 2019-03-23 DIAGNOSIS — M25561 Pain in right knee: Secondary | ICD-10-CM | POA: Diagnosis not present

## 2019-03-23 DIAGNOSIS — M23331 Other meniscus derangements, other medial meniscus, right knee: Secondary | ICD-10-CM | POA: Diagnosis not present

## 2019-03-24 DIAGNOSIS — M25661 Stiffness of right knee, not elsewhere classified: Secondary | ICD-10-CM | POA: Diagnosis not present

## 2019-03-24 DIAGNOSIS — M25561 Pain in right knee: Secondary | ICD-10-CM | POA: Diagnosis not present

## 2019-03-29 DIAGNOSIS — M25661 Stiffness of right knee, not elsewhere classified: Secondary | ICD-10-CM | POA: Diagnosis not present

## 2019-03-29 DIAGNOSIS — M25561 Pain in right knee: Secondary | ICD-10-CM | POA: Diagnosis not present

## 2019-03-31 DIAGNOSIS — M25661 Stiffness of right knee, not elsewhere classified: Secondary | ICD-10-CM | POA: Diagnosis not present

## 2019-03-31 DIAGNOSIS — M25561 Pain in right knee: Secondary | ICD-10-CM | POA: Diagnosis not present

## 2019-04-01 ENCOUNTER — Other Ambulatory Visit: Payer: Self-pay

## 2019-04-01 ENCOUNTER — Ambulatory Visit (AMBULATORY_SURGERY_CENTER): Payer: Self-pay | Admitting: *Deleted

## 2019-04-01 VITALS — Temp 96.8°F | Ht 62.5 in | Wt 165.0 lb

## 2019-04-01 DIAGNOSIS — Z1211 Encounter for screening for malignant neoplasm of colon: Secondary | ICD-10-CM

## 2019-04-01 MED ORDER — NA SULFATE-K SULFATE-MG SULF 17.5-3.13-1.6 GM/177ML PO SOLN: ORAL | 0 refills | Status: DC

## 2019-04-01 NOTE — Progress Notes (Signed)
Patient denies any allergies to eggs or soy. Patient denies any problems with anesthesia/sedation. Patient denies any oxygen use at home. Patient denies taking any diet/weight loss medications or blood thinners. Suprep coupon given to pt. EMMI education assisgned to patient on colonoscopy, this was explained and instructions given to patient.Pt is aware that care partner will wait in the car during procedure; if they feel like they will be too hot to wait in the car; they may wait in the lobby.  We want them to wear a mask (we do not have any that we can provide them), practice social distancing, and we will check their temperatures when they get here.  I did remind patient that their care partner needs to stay in the parking lot the entire time. Pt will wear mask into building.

## 2019-04-05 DIAGNOSIS — M25661 Stiffness of right knee, not elsewhere classified: Secondary | ICD-10-CM | POA: Diagnosis not present

## 2019-04-05 DIAGNOSIS — M25561 Pain in right knee: Secondary | ICD-10-CM | POA: Diagnosis not present

## 2019-04-07 DIAGNOSIS — M25661 Stiffness of right knee, not elsewhere classified: Secondary | ICD-10-CM | POA: Diagnosis not present

## 2019-04-07 DIAGNOSIS — M25561 Pain in right knee: Secondary | ICD-10-CM | POA: Diagnosis not present

## 2019-04-08 ENCOUNTER — Encounter: Payer: Self-pay | Admitting: Gastroenterology

## 2019-04-12 ENCOUNTER — Telehealth: Payer: Self-pay | Admitting: Gastroenterology

## 2019-04-12 DIAGNOSIS — M25661 Stiffness of right knee, not elsewhere classified: Secondary | ICD-10-CM | POA: Diagnosis not present

## 2019-04-12 DIAGNOSIS — M25561 Pain in right knee: Secondary | ICD-10-CM | POA: Diagnosis not present

## 2019-04-12 NOTE — Telephone Encounter (Signed)

## 2019-04-13 ENCOUNTER — Ambulatory Visit (AMBULATORY_SURGERY_CENTER): Payer: Managed Care, Other (non HMO) | Admitting: Gastroenterology

## 2019-04-13 ENCOUNTER — Other Ambulatory Visit: Payer: Self-pay

## 2019-04-13 ENCOUNTER — Encounter: Payer: Self-pay | Admitting: Gastroenterology

## 2019-04-13 VITALS — BP 132/72 | HR 53 | Temp 98.9°F | Resp 11 | Ht 62.5 in | Wt 165.0 lb

## 2019-04-13 DIAGNOSIS — Z1211 Encounter for screening for malignant neoplasm of colon: Secondary | ICD-10-CM

## 2019-04-13 DIAGNOSIS — K635 Polyp of colon: Secondary | ICD-10-CM | POA: Diagnosis not present

## 2019-04-13 DIAGNOSIS — D120 Benign neoplasm of cecum: Secondary | ICD-10-CM

## 2019-04-13 DIAGNOSIS — D123 Benign neoplasm of transverse colon: Secondary | ICD-10-CM

## 2019-04-13 DIAGNOSIS — D122 Benign neoplasm of ascending colon: Secondary | ICD-10-CM

## 2019-04-13 MED ORDER — SODIUM CHLORIDE 0.9 % IV SOLN: 500.0000 mL | Freq: Once | INTRAVENOUS | Status: DC

## 2019-04-13 NOTE — Progress Notes (Signed)
PT taken to PACU. Monitors in place. VSS. Report given to RN. 

## 2019-04-13 NOTE — Op Note (Signed)
[facility] Patient Name: [name] Procedure Date: 04/13/2019 1:36 PM MRN: [id number] Endoscopist: Mauri Pole , MD Age: 68 Referring MD:  Date of Birth: 1950/12/25 Gender: Female Account #: [id number] Procedure:                Colonoscopy Indications:              Screening for colorectal malignant neoplasm Medicines:                Monitored Anesthesia Care Procedure:                Pre-Anesthesia Assessment:                           - Prior to the procedure, a History and Physical                            was performed, and patient medications and                            allergies were reviewed. The patient's tolerance of                            previous anesthesia was also reviewed. The risks                            and benefits of the procedure and the sedation                            options and risks were discussed with the patient.                            All questions were answered, and informed consent                            was obtained. Prior Anticoagulants: The patient has                            taken no previous anticoagulant or antiplatelet                            agents. ASA Grade Assessment: II - A patient with                            mild systemic disease. After reviewing the risks                            and benefits, the patient was deemed in                            satisfactory condition to undergo the procedure.                           After obtaining informed consent, the colonoscope  was passed under direct vision. Throughout the                            procedure, the patient's blood pressure, pulse, and                            oxygen saturations were monitored continuously. The                            Endoscope was introduced through the anus and                            advanced to the the cecum, identified by                            appendiceal orifice and  ileocecal valve. The                            colonoscopy was performed without difficulty. The                            patient tolerated the procedure well. The quality                            of the bowel preparation was good. The ileocecal                            valve, appendiceal orifice, and rectum were                            photographed. Scope In: 2:24:12 PM Scope Out: [id number] PM Scope Withdrawal Time: 0 hours 16 minutes 33 seconds  Total Procedure Duration: 0 hours 22 minutes 1 second  Findings:                 The perianal and digital rectal examinations were                            normal.                           An extrinsic severe stenosis was found in the                            sigmoid colon with fixed segment of sigmoid colon.                            The scope was withdrawn and replaced with the adult                            endoscope in order to accomplish the maneuver.                           A 2 mm polyp was found in the cecum. The polyp was  sessile. The polyp was removed with a cold biopsy                            forceps. Resection and retrieval were complete.                           Four sessile polyps were found in the transverse                            colon and ascending colon. The polyps were 3 to 8                            mm in size. These polyps were removed with a cold                            snare. Resection and retrieval were complete.                           Scattered small-mouthed diverticula were found in                            the sigmoid colon and ascending colon.                           Non-bleeding internal hemorrhoids were found during                            retroflexion. The hemorrhoids were small. Complications:            No immediate complications. Estimated Blood Loss:     Estimated blood loss was minimal. Impression:               - Stricture in the sigmoid  colon.                           - One 2 mm polyp in the cecum, removed with a cold                            biopsy forceps. Resected and retrieved.                           - Four 3 to 8 mm polyps in the transverse colon and                            in the ascending colon, removed with a cold snare.                            Resected and retrieved.                           - Diverticulosis in the sigmoid colon and in the                            ascending colon.                           -  Non-bleeding internal hemorrhoids. Recommendation:           - Patient has a contact number available for                            emergencies. The signs and symptoms of potential                            delayed complications were discussed with the                            patient. Return to normal activities tomorrow.                            Written discharge instructions were provided to the                            patient.                           - Resume previous diet.                           - Continue present medications.                           - Await pathology results.                           - Repeat colonoscopy in 3 - 5 years for                            surveillance based on pathology results. Mauri Pole, MD 04/13/2019 2:54:28 PM This report has been signed electronically.

## 2019-04-13 NOTE — Progress Notes (Signed)
Called to room to assist during endoscopic procedure.  Patient ID and intended procedure confirmed with present staff. Received instructions for my participation in the procedure from the performing physician.  

## 2019-04-13 NOTE — Progress Notes (Addendum)
[name]   Pt's states no medical or surgical changes since previsit or office visit.

## 2019-04-13 NOTE — Patient Instructions (Signed)
YOU HAD AN ENDOSCOPIC PROCEDURE TODAY AT [facility]:   Refer to the procedure report that was given to you for any specific questions about what was found during the examination.  If the procedure report does not answer your questions, please call your gastroenterologist to clarify.  If you requested that your care partner not be given the details of your procedure findings, then the procedure report has been included in a sealed envelope for you to review at your convenience later.  YOU SHOULD EXPECT: Some feelings of bloating in the abdomen. Passage of more gas than usual.  Walking can help get rid of the air that was put into your GI tract during the procedure and reduce the bloating. If you had a lower endoscopy (such as a colonoscopy or flexible sigmoidoscopy) you may notice spotting of blood in your stool or on the toilet paper. If you underwent a bowel prep for your procedure, you may not have a normal bowel movement for a few days.  Please Note:  You might notice some irritation and congestion in your nose or some drainage.  This is from the oxygen used during your procedure.  There is no need for concern and it should clear up in a day or so.  SYMPTOMS TO REPORT IMMEDIATELY:   Following lower endoscopy (colonoscopy or flexible sigmoidoscopy):  Excessive amounts of blood in the stool  Significant tenderness or worsening of abdominal pains  Swelling of the abdomen that is new, acute  Fever of 100F or higher  For urgent or emergent issues, a gastroenterologist can be reached at any hour by calling [phone].  DIET:  We do recommend a small meal at first, but then you may proceed to your regular diet.  Drink plenty of fluids but you should avoid alcoholic beverages for 24 hours.  ACTIVITY:  You should plan to take it easy for the rest of today and you should NOT DRIVE or use heavy machinery until tomorrow (because of the sedation medicines used during the test).     FOLLOW UP: Our staff will call the number listed on your records 48-72 hours following your procedure to check on you and address any questions or concerns that you may have regarding the information given to you following your procedure. If we do not reach you, we will leave a message.  We will attempt to reach you two times.  During this call, we will ask if you have developed any symptoms of COVID 19. If you develop any symptoms (ie: fever, flu-like symptoms, shortness of breath, cough etc.) before then, please call [phone].  If you test positive for Covid 19 in the 2 weeks post procedure, please call and report this information to [place].    If any biopsies were taken you will be contacted by phone or by letter within the next 1-3 weeks.  Please call us at [phone] if you have not heard about the biopsies in 3 weeks.    SIGNATURES/CONFIDENTIALITY: You and/or your care partner have signed paperwork which will be entered into your electronic medical record.  These signatures attest to the fact that that the information above on your After Visit Summary has been reviewed and is understood.  Full responsibility of the confidentiality of this discharge information lies with you and/or your care-partner.  Await pathology- next colonoscopy in 3-5 years Please read over handouts about polyps, diverticulosis and hemorrhoids  Continue your normal medications

## 2019-04-15 ENCOUNTER — Telehealth: Payer: Self-pay | Admitting: *Deleted

## 2019-04-15 NOTE — Telephone Encounter (Signed)
  Follow up Call-  Call back number 04/13/2019  Post procedure Call Back phone  # [phone] cell  Permission to leave phone message Yes  Some recent data might be hidden     Patient questions:  Do you have a fever, pain , or abdominal swelling? No. Pain Score  0 *  Have you tolerated food without any problems? Yes.    Have you been able to return to your normal activities? Yes.    Do you have any questions about your discharge instructions: Diet   No. Medications  No. Follow up visit  No.  Do you have questions or concerns about your Care? No.  Actions: * If pain score is 4 or above: No action needed, pain <4.  1. Have you developed a fever since your procedure? no  2.   Have you had an respiratory symptoms (SOB or cough) since your procedure? no  3.   Have you tested positive for COVID 19 since your procedure no  4.   Have you had any family members/close contacts diagnosed with the COVID 19 since your procedure?  no   If yes to any of these questions please route to Joylene John, RN and Alphonsa Gin, Therapist, sports.

## 2019-04-20 ENCOUNTER — Encounter: Payer: Self-pay | Admitting: Gastroenterology

## 2019-06-08 ENCOUNTER — Encounter: Payer: Self-pay | Admitting: Physician Assistant

## 2019-06-08 ENCOUNTER — Ambulatory Visit (INDEPENDENT_AMBULATORY_CARE_PROVIDER_SITE_OTHER): Payer: Managed Care, Other (non HMO) | Admitting: Physician Assistant

## 2019-06-08 ENCOUNTER — Other Ambulatory Visit: Payer: Self-pay

## 2019-06-08 VITALS — Temp 97.2°F

## 2019-06-08 DIAGNOSIS — S91012A Laceration without foreign body, left ankle, initial encounter: Secondary | ICD-10-CM | POA: Diagnosis not present

## 2019-06-08 DIAGNOSIS — Z23 Encounter for immunization: Secondary | ICD-10-CM | POA: Diagnosis not present

## 2019-06-08 NOTE — Progress Notes (Signed)
Patient here for Influenza Vaccine and Pneumococcal Vaccination.  Patient also given a Td vaccine due to laceration of left ankle,front part.

## 2019-07-07 ENCOUNTER — Telehealth: Payer: Self-pay | Admitting: Physician Assistant

## 2019-07-07 NOTE — Telephone Encounter (Signed)
Pt's spouse called in requesting pt's  blood type. Pt an spouse was told to find out.    Pt's spouse has tested positive for covid   CB: [phone]

## 2019-07-07 NOTE — Telephone Encounter (Signed)
Pt advised that we would have to order lab work to get her blood type.  She is going to wait for now and possibly donate blood to get her blood type that way.   Thanks,   -Mickel Baas

## 2019-07-07 NOTE — Telephone Encounter (Signed)
From [facility] 

## 2019-07-13 ENCOUNTER — Encounter: Payer: Self-pay | Admitting: Physician Assistant

## 2019-07-13 DIAGNOSIS — F338 Other recurrent depressive disorders: Secondary | ICD-10-CM

## 2019-07-13 MED ORDER — ESCITALOPRAM OXALATE 20 MG PO TABS: 20.0000 mg | ORAL_TABLET | Freq: Every day | ORAL | 1 refills | Status: DC

## 2019-09-07 ENCOUNTER — Encounter: Payer: Self-pay | Admitting: Physician Assistant

## 2019-09-07 DIAGNOSIS — K219 Gastro-esophageal reflux disease without esophagitis: Secondary | ICD-10-CM

## 2019-09-08 MED ORDER — FAMOTIDINE 20 MG PO TABS: 20.0000 mg | ORAL_TABLET | Freq: Two times a day (BID) | ORAL | 0 refills | Status: DC

## 2019-09-08 NOTE — Addendum Note (Signed)
Addended by: Mar Daring on: 09/08/2019 09:04 AM   Modules accepted: Orders

## 2019-09-09 ENCOUNTER — Encounter: Payer: Self-pay | Admitting: Physician Assistant

## 2019-09-30 ENCOUNTER — Other Ambulatory Visit: Payer: Self-pay | Admitting: Physician Assistant

## 2019-09-30 DIAGNOSIS — K219 Gastro-esophageal reflux disease without esophagitis: Secondary | ICD-10-CM

## 2019-10-13 DIAGNOSIS — Z23 Encounter for immunization: Secondary | ICD-10-CM | POA: Diagnosis not present

## 2019-11-03 ENCOUNTER — Other Ambulatory Visit: Payer: Self-pay | Admitting: Physician Assistant

## 2019-11-03 DIAGNOSIS — F338 Other recurrent depressive disorders: Secondary | ICD-10-CM

## 2019-11-03 NOTE — Telephone Encounter (Signed)
Patient has appointment 12/16/19-courtesy refill given

## 2019-11-09 ENCOUNTER — Encounter: Payer: Self-pay | Admitting: Physician Assistant

## 2019-11-09 DIAGNOSIS — F338 Other recurrent depressive disorders: Secondary | ICD-10-CM

## 2019-11-09 MED ORDER — ESCITALOPRAM OXALATE 20 MG PO TABS: 20.0000 mg | ORAL_TABLET | Freq: Every day | ORAL | 0 refills | Status: DC

## 2019-11-10 DIAGNOSIS — Z23 Encounter for immunization: Secondary | ICD-10-CM | POA: Diagnosis not present

## 2019-11-19 ENCOUNTER — Encounter: Payer: Self-pay | Admitting: Physician Assistant

## 2019-12-16 ENCOUNTER — Ambulatory Visit: Payer: Managed Care, Other (non HMO)

## 2019-12-17 ENCOUNTER — Other Ambulatory Visit: Payer: Self-pay | Admitting: Physician Assistant

## 2019-12-17 DIAGNOSIS — I10 Essential (primary) hypertension: Secondary | ICD-10-CM

## 2019-12-19 NOTE — Progress Notes (Signed)
Subjective:   [name] is a 69 y.o. female who presents for Medicare Annual (Subsequent) preventive examination.    This visit is being conducted through telemedicine due to the COVID-19 pandemic. This patient has given me verbal consent via doximity to conduct this visit, patient states they are participating from their home address. Some vital signs may be absent or patient reported.    Patient identification: identified by name, DOB, and current address  Review of Systems:  N/A  Cardiac Risk Factors include: advanced age (>68men, >33 women);dyslipidemia     Objective:     Vitals: There were no vitals taken for this visit.  There is no height or weight on file to calculate BMI. Unable to obtain vitals due to visit being conducted via telephonically.   Advanced Directives 12/20/2019 12/15/2018 12/10/2017 01/23/2015  Does Patient Have a Medical Advance Directive? No No No No  Would patient like information on creating a medical advance directive? Yes (ED - Information included in AVS) No - Patient declined No - Patient declined -    Tobacco Social History   Tobacco Use  Smoking Status Never Smoker  Smokeless Tobacco Never Used     Counseling given: Not Answered   Clinical Intake:  Pre-visit preparation completed: Yes  Pain : No/denies pain     Nutritional Risks: None Diabetes: No  How often do you need to have someone help you when you read instructions, pamphlets, or other written materials from your doctor or pharmacy?: 1 - Never  Interpreter Needed?: No  Information entered by :: [facility], LPN  Past Medical History:  Diagnosis Date  . Allergy   . Anxiety   . Breast mass 10/07/2016   left 2:00  . Depression   . GERD (gastroesophageal reflux disease)   . Hypertension    Past Surgical History:  Procedure Laterality Date  . ABDOMINAL HYSTERECTOMY  1988   complete; due to fibroids  . BLADDER REPAIR  2007  . CHOLECYSTECTOMY  2003  . COLONOSCOPY   04/16/2009  . KNEE SURGERY Left 2012  . TONSILLECTOMY  1960   Family History  Problem Relation Age of Onset  . Cervical cancer Mother   . COPD Mother   . COPD Father   . Hypertension Father   . Colon cancer Neg Hx   . Colon polyps Neg Hx   . Esophageal cancer Neg Hx   . Rectal cancer Neg Hx   . Stomach cancer Neg Hx    Social History   Socioeconomic History  . Marital status: Married    Spouse name: Alyson Locket. Lissa Merlin  . Number of children: 2  . Years of education: Not on file  . Highest education level: Associate degree: occupational, Hotel manager, or vocational program  Occupational History  . Occupation: retired  Tobacco Use  . Smoking status: Never Smoker  . Smokeless tobacco: Never Used  Substance and Sexual Activity  . Alcohol use: No  . Drug use: No  . Sexual activity: Not on file  Other Topics Concern  . Not on file  Social History Narrative  . Not on file   Social Determinants of Health   Financial Resource Strain: Low Risk   . Difficulty of Paying Living Expenses: Not hard at all  Food Insecurity: No Food Insecurity  . Worried About Charity fundraiser in the Last Year: Never true  . Ran Out of Food in the Last Year: Never true  Transportation Needs: No Transportation Needs  .  Lack of Transportation (Medical): No  . Lack of Transportation (Non-Medical): No  Physical Activity: Inactive  . Days of Exercise per Week: 0 days  . Minutes of Exercise per Session: 0 min  Stress: No Stress Concern Present  . Feeling of Stress : Not at all  Social Connections: Slightly Isolated  . Frequency of Communication with Friends and Family: More than three times a week  . Frequency of Social Gatherings with Friends and Family: More than three times a week  . Attends Religious Services: More than 4 times per year  . Active Member of Clubs or Organizations: No  . Attends Archivist Meetings: Never  . Marital Status: Married    Outpatient Encounter Medications as  of 12/20/2019  Medication Sig  . amLODipine-benazepril (LOTREL) 5-10 MG capsule TAKE 1 CAPSULE DAILY  . Ascorbic Acid (VITAMIN C) 100 MG tablet Take by mouth daily.  . Cholecalciferol (VITAMIN D3) 2000 units TABS Take 2,000 Units by mouth daily.  Marland Kitchen escitalopram (LEXAPRO) 20 MG tablet Take 1 tablet (20 mg total) by mouth daily.  . Multiple Vitamins-Minerals (MULTIVITAMIN WOMEN 50+ PO) Take by mouth daily.  . Omega-3 Fatty Acids (FISH OIL) 1200 MG CAPS Take 1,290 mg by mouth daily.  . Potassium 99 MG TABS Take 99 mg by mouth daily.  . famotidine (PEPCID) 20 MG tablet TAKE 1 TABLET BY MOUTH TWICE A DAY (Patient not taking: Reported on 12/20/2019)   No facility-administered encounter medications on file as of 12/20/2019.    Activities of Daily Living In your present state of health, do you have any difficulty performing the following activities: 12/20/2019  Hearing? N  Vision? N  Difficulty concentrating or making decisions? N  Walking or climbing stairs? N  Dressing or bathing? N  Doing errands, shopping? N  Preparing Food and eating ? N  Using the Toilet? N  In the past six months, have you accidently leaked urine? N  Do you have problems with loss of bowel control? N  Managing your Medications? N  Managing your Finances? N  Housekeeping or managing your Housekeeping? N  Some recent data might be hidden    Patient Care Team: Mar Daring, PA-C as PCP - General (Family Medicine) Ralene Bathe, MD as Consulting Physician (Dermatology) Bryson Ha, OD as Consulting Physician (Optometry) Everlene Farrier, MD as Consulting Physician (Obstetrics and Gynecology) Mauri Pole, MD as Consulting Physician (Gastroenterology)    Assessment:   This is a routine wellness examination for [name].  Exercise Activities and Dietary recommendations Current Exercise Habits: Home exercise routine, Type of exercise: walking(walks dogs daily), Time (Minutes): 30, Frequency  (Times/Week): 7, Weekly Exercise (Minutes/Week): 210, Intensity: Mild, Exercise limited by: None identified  Goals    . DIET - REDUCE PORTION SIZE     Recommend cutting portion sizes in half and eating 3 small meals a day with two healthy snacks a day.        Fall Risk: Fall Risk  12/20/2019 12/15/2018 12/10/2017 01/23/2015  Falls in the past year? 1 0 Yes No  Number falls in past yr: 0 - 1 -  Comment - - accidentally stepped off of a small ledge -  Injury with Fall? 0 - No -  Follow up Falls prevention discussed - Falls prevention discussed -    FALL RISK PREVENTION PERTAINING TO THE HOME:  Any stairs in or around the home? Yes  If so, are there any without handrails? No   Home free  of loose throw rugs in walkways, pet beds, electrical cords, etc? Yes  Adequate lighting in your home to reduce risk of falls? Yes   ASSISTIVE DEVICES UTILIZED TO PREVENT FALLS:  Life alert? No  Use of a cane, walker or w/c? No  Grab bars in the bathroom? Yes  Shower chair or bench in shower? Yes  Elevated toilet seat or a handicapped toilet? Yes    TIMED UP AND GO:  Was the test performed? No .    Depression Screen PHQ 2/9 Scores 12/20/2019 03/04/2019 12/15/2018 02/01/2018  PHQ - 2 Score 0 0 0 5  PHQ- 9 Score - 1 - 19     Cognitive Function: Declined today.        Immunization History  Administered Date(s) Administered  . Fluad Quad(high Dose 65+) 06/08/2019  . Influenza, High Dose Seasonal PF 07/06/2017, 05/12/2018  . PFIZER SARS-COV-2 Vaccination 10/13/2019, 11/10/2019  . Pneumococcal Conjugate-13 05/12/2018  . Pneumococcal Polysaccharide-23 06/08/2019  . Td 06/08/2019  . Tdap 03/14/2009    Qualifies for Shingles Vaccine? Yes . Due for Shingrix. Pt has been advised to call insurance company to determine out of pocket expense. Advised may also receive vaccine at local pharmacy or Health Dept. Verbalized acceptance and understanding.  Tdap: Up to date  Flu Vaccine: Up to  date  Pneumococcal Vaccine: Completed series  Screening Tests Health Maintenance  Topic Date Due  . INFLUENZA VACCINE  03/18/2020  . MAMMOGRAM  11/02/2020  . COLONOSCOPY  04/12/2022  . DEXA SCAN  11/03/2023  . TETANUS/TDAP  06/07/2029  . COVID-19 Vaccine  Completed  . Hepatitis C Screening  Completed  . PNA vac Low Risk Adult  Completed    Cancer Screenings:  Colorectal Screening: Completed 04/13/19. Repeat every 3 years.   Mammogram: Completed 11/03/18. Repeat every 1-2 years as advised.   Bone Density: Completed 11/03/18. Results reflect OSTEOPENIA. Repeat every 5 years.   Lung Cancer Screening: (Low Dose CT Chest recommended if Age 16-80 years, 30 pack-year currently smoking OR have quit w/in 15years.) does not qualify.   Additional Screening:  Hepatitis C Screening: Up to date  Vision Screening: Recommended annual ophthalmology exams for early detection of glaucoma and other disorders of the eye.  Dental Screening: Recommended annual dental exams for proper oral hygiene  Community Resource Referral:  CRR required this visit?  No       Plan:  I have personally reviewed and addressed the Medicare Annual Wellness questionnaire and have noted the following in the patient's chart:  A. Medical and social history B. Use of alcohol, tobacco or illicit drugs  C. Current medications and supplements D. Functional ability and status E.  Nutritional status F.  Physical activity G. Advance directives H. List of other physicians I.  Hospitalizations, surgeries, and ER visits in previous 12 months J.  Teller such as hearing and vision if needed, cognitive and depression L. Referrals and appointments   In addition, I have reviewed and discussed with patient certain preventive protocols, quality metrics, and best practice recommendations. A written personalized care plan for preventive services as well as general preventive health recommendations were provided to  patient. Nurse Health Advisor  Signed,    Serigne Kubicek End[place], [place]  [id number] Nurse Health Advisor   Nurse Notes: None.

## 2019-12-20 ENCOUNTER — Ambulatory Visit (INDEPENDENT_AMBULATORY_CARE_PROVIDER_SITE_OTHER): Payer: Managed Care, Other (non HMO)

## 2019-12-20 ENCOUNTER — Other Ambulatory Visit: Payer: Self-pay

## 2019-12-20 DIAGNOSIS — Z0000 Encounter for general adult medical examination without abnormal findings: Secondary | ICD-10-CM | POA: Diagnosis not present

## 2019-12-20 NOTE — Patient Instructions (Addendum)
[name] , Thank you for taking time to come for your Medicare Wellness Visit. I appreciate your ongoing commitment to your health goals. Please review the following plan we discussed and let me know if I can assist you in the future.   Screening recommendations/referrals: Colonoscopy: Up to date, due 03/2069 Mammogram: Up to date, due 10/2020 Bone Density: Up to date, due 10/2023 Recommended yearly ophthalmology/optometry visit for glaucoma screening and checkup Recommended yearly dental visit for hygiene and checkup  Vaccinations: Influenza vaccine: Up to date Pneumococcal vaccine: Completed series Tdap vaccine: Up to date Shingles vaccine: Pt declines today.     Advanced directives: Advance directive discussed with you today. I have provided a copy for you to complete at home and have notarized. Once this is complete please bring a copy in to our office so we can scan it into your chart.  Conditions/risks identified: Continue to eat 3 small meals a day with 2 healthy snack in between.  Next appointment: None, declined scheduling a follow up with PCP or an AWV for 2070 at this time.    Preventive Care 70 Years and Older, Female Preventive care refers to lifestyle choices and visits with your health care provider that can promote health and wellness. What does preventive care include?  A yearly physical exam. This is also called an annual well check.  Dental exams once or twice a year.  Routine eye exams. Ask your health care provider how often you should have your eyes checked.  Personal lifestyle choices, including:  Daily care of your teeth and gums.  Regular physical activity.  Eating a healthy diet.  Avoiding tobacco and drug use.  Limiting alcohol use.  Practicing safe sex.  Taking low-dose aspirin every day.  Taking vitamin and mineral supplements as recommended by your health care provider. What happens during an annual well check? The services and screenings  done by your health care provider during your annual well check will depend on your age, overall health, lifestyle risk factors, and family history of disease. Counseling  Your health care provider may ask you questions about your:  Alcohol use.  Tobacco use.  Drug use.  Emotional well-being.  Home and relationship well-being.  Sexual activity.  Eating habits.  History of falls.  Memory and ability to understand (cognition).  Work and work Statistician.  Reproductive health. Screening  You may have the following tests or measurements:  Height, weight, and BMI.  Blood pressure.  Lipid and cholesterol levels. These may be checked every 5 years, or more frequently if you are over 65 years old.  Skin check.  Lung cancer screening. You may have this screening every year starting at age 70 if you have a 30-pack-year history of smoking and currently smoke or have quit within the past 15 years.  Fecal occult blood test (FOBT) of the stool. You may have this test every year starting at age 70.  Flexible sigmoidoscopy or colonoscopy. You may have a sigmoidoscopy every 5 years or a colonoscopy every 10 years starting at age 70.  Hepatitis C blood test.  Hepatitis B blood test.  Sexually transmitted disease (STD) testing.  Diabetes screening. This is done by checking your blood sugar (glucose) after you have not eaten for a while (fasting). You may have this done every 1-3 years.  Bone density scan. This is done to screen for osteoporosis. You may have this done starting at age 70.  Mammogram. This may be done every 1-2 years. Talk  to your health care provider about how often you should have regular mammograms. Talk with your health care provider about your test results, treatment options, and if necessary, the need for more tests. Vaccines  Your health care provider may recommend certain vaccines, such as:  Influenza vaccine. This is recommended every year.  Tetanus,  diphtheria, and acellular pertussis (Tdap, Td) vaccine. You may need a Td booster every 10 years.  Zoster vaccine. You may need this after age 70.  Pneumococcal 13-valent conjugate (PCV13) vaccine. One dose is recommended after age 70.  Pneumococcal polysaccharide (PPSV23) vaccine. One dose is recommended after age 70. Talk to your health care provider about which screenings and vaccines you need and how often you need them. This information is not intended to replace advice given to you by your health care provider. Make sure you discuss any questions you have with your health care provider. Document Released: 08/31/2015 Document Revised: 04/23/2016 Document Reviewed: 06/05/2015 Elsevier Interactive Patient Education  2017 [place] Prevention in the Home Falls can cause injuries. They can happen to people of all ages. There are many things you can do to make your home safe and to help prevent falls. What can I do on the outside of my home?  Regularly fix the edges of walkways and driveways and fix any cracks.  Remove anything that might make you trip as you walk through a door, such as a raised step or threshold.  Trim any bushes or trees on the path to your home.  Use bright outdoor lighting.  Clear any walking paths of anything that might make someone trip, such as rocks or tools.  Regularly check to see if handrails are loose or broken. Make sure that both sides of any steps have handrails.  Any raised decks and porches should have guardrails on the edges.  Have any leaves, snow, or ice cleared regularly.  Use sand or salt on walking paths during winter.  Clean up any spills in your garage right away. This includes oil or grease spills. What can I do in the bathroom?  Use night lights.  Install grab bars by the toilet and in the tub and shower. Do not use towel bars as grab bars.  Use non-skid mats or decals in the tub or shower.  If you need to sit down in  the shower, use a plastic, non-slip stool.  Keep the floor dry. Clean up any water that spills on the floor as soon as it happens.  Remove soap buildup in the tub or shower regularly.  Attach bath mats securely with double-sided non-slip rug tape.  Do not have throw rugs and other things on the floor that can make you trip. What can I do in the bedroom?  Use night lights.  Make sure that you have a light by your bed that is easy to reach.  Do not use any sheets or blankets that are too big for your bed. They should not hang down onto the floor.  Have a firm chair that has side arms. You can use this for support while you get dressed.  Do not have throw rugs and other things on the floor that can make you trip. What can I do in the kitchen?  Clean up any spills right away.  Avoid walking on wet floors.  Keep items that you use a lot in easy-to-reach places.  If you need to reach something above you, use a strong step stool that  has a grab bar.  Keep electrical cords out of the way.  Do not use floor polish or wax that makes floors slippery. If you must use wax, use non-skid floor wax.  Do not have throw rugs and other things on the floor that can make you trip. What can I do with my stairs?  Do not leave any items on the stairs.  Make sure that there are handrails on both sides of the stairs and use them. Fix handrails that are broken or loose. Make sure that handrails are as long as the stairways.  Check any carpeting to make sure that it is firmly attached to the stairs. Fix any carpet that is loose or worn.  Avoid having throw rugs at the top or bottom of the stairs. If you do have throw rugs, attach them to the floor with carpet tape.  Make sure that you have a light switch at the top of the stairs and the bottom of the stairs. If you do not have them, ask someone to add them for you. What else can I do to help prevent falls?  Wear shoes that:  Do not have high  heels.  Have rubber bottoms.  Are comfortable and fit you well.  Are closed at the toe. Do not wear sandals.  If you use a stepladder:  Make sure that it is fully opened. Do not climb a closed stepladder.  Make sure that both sides of the stepladder are locked into place.  Ask someone to hold it for you, if possible.  Clearly mark and make sure that you can see:  Any grab bars or handrails.  First and last steps.  Where the edge of each step is.  Use tools that help you move around (mobility aids) if they are needed. These include:  Canes.  Walkers.  Scooters.  Crutches.  Turn on the lights when you go into a dark area. Replace any light bulbs as soon as they burn out.  Set up your furniture so you have a clear path. Avoid moving your furniture around.  If any of your floors are uneven, fix them.  If there are any pets around you, be aware of where they are.  Review your medicines with your doctor. Some medicines can make you feel dizzy. This can increase your chance of falling. Ask your doctor what other things that you can do to help prevent falls. This information is not intended to replace advice given to you by your health care provider. Make sure you discuss any questions you have with your health care provider. Document Released: 05/31/2009 Document Revised: 01/10/2016 Document Reviewed: 09/08/2014 Elsevier Interactive Patient Education  2017 [employer].

## 2020-04-04 ENCOUNTER — Other Ambulatory Visit: Payer: Self-pay | Admitting: Physician Assistant

## 2020-04-04 DIAGNOSIS — F338 Other recurrent depressive disorders: Secondary | ICD-10-CM

## 2020-04-04 MED ORDER — ESCITALOPRAM OXALATE 20 MG PO TABS: 20.0000 mg | ORAL_TABLET | Freq: Every day | ORAL | 0 refills | Status: DC

## 2020-04-04 NOTE — Addendum Note (Signed)
Addended by: Jefferson Fuel on: 04/04/2020 11:28 AM   Modules accepted: Orders

## 2020-04-04 NOTE — Telephone Encounter (Signed)
PT need a refill escitalopram (LEXAPRO) 20 MG tablet [[id number]]  [facility] - [place]ia Buff, [place] - [address]  [address] [place] [address]  Phone: [phone] Fax: [phone]

## 2020-06-18 NOTE — Progress Notes (Signed)
Established patient visit   Patient: [name]   DOB: 10-24-1950   69 y.o. Female  MRN: [id number] Visit Date: 06/20/2020  Today's healthcare provider: Mar Daring, PA-C   Chief Complaint  Patient presents with  . Depression  . Hypertension   Subjective    HPI  Follow up for seasonal affective disorder  The patient was last seen for this 15 months ago. Changes made at last visit include none. Stable on Lexapro 20mg .  She reports excellent compliance with treatment. She feels that condition is Improved. She is not having side effects.   ----------------------------------------------------------------------------------------- Hypertension, follow-up  BP Readings from Last 3 Encounters:  06/20/20 (!) 134/58  04/13/19 132/72  03/04/19 (!) 156/76   Wt Readings from Last 3 Encounters:  06/20/20 159 lb (72.1 kg)  04/13/19 165 lb (74.8 kg)  04/01/19 165 lb (74.8 kg)     She was last seen for hypertension 1 years ago.  BP at that visit was 156/76. Management since that visit includes Stable. Continue Amlodipine-benazepril 5-10mg   She reports excellent compliance with treatment. She is not having side effects.  She is following a Low Sodium diet. She is exercising. She does not smoke.  Use of agents associated with hypertension: none.   Outside blood pressures are stable. Symptoms: No chest pain No chest pressure  No palpitations No syncope  No dyspnea No orthopnea  No paroxysmal nocturnal dyspnea No lower extremity edema   Pertinent labs: Lab Results  Component Value Date   CHOL 217 (H) 03/04/2019   HDL 63 03/04/2019   LDLCALC 126 (H) 03/04/2019   TRIG 140 03/04/2019   CHOLHDL 3.4 03/04/2019   Lab Results  Component Value Date   NA 142 03/04/2019   K 4.6 03/04/2019   CREATININE 1.06 (H) 03/04/2019   GFRNONAA 54 (L) 03/04/2019   GFRAA 62 03/04/2019   GLUCOSE 86 03/04/2019     The 10-year ASCVD risk score Mikey Bussing DC Jr., et al., 2013)  is: 13.7%   ---------------------------------------------------------------------------------------------------  Patient Active Problem List   Diagnosis Date Noted  . Hypercholesterolemia 12/10/2017  . Allergic rhinitis, seasonal 01/23/2015  . Anxiety 01/23/2015  . Benign essential HTN 01/23/2015  . Clinical depression 01/23/2015  . Pharyngeal inflammation 01/23/2015  . Seasonal affective disorder ([place]) 01/23/2015  . Abnormal chest x-ray 01/23/2015   Social History   Tobacco Use  . Smoking status: Never Smoker  . Smokeless tobacco: Never Used  Vaping Use  . Vaping Use: Never used  Substance Use Topics  . Alcohol use: No  . Drug use: No   No Known Allergies     Medications: Outpatient Medications Prior to Visit  Medication Sig  . Ascorbic Acid (VITAMIN C) 100 MG tablet Take by mouth daily.  . Cholecalciferol (VITAMIN D3) 2000 units TABS Take 2,000 Units by mouth daily.  . famotidine (PEPCID) 20 MG tablet TAKE 1 TABLET BY MOUTH TWICE A DAY  . Menaquinone-7 (VITAMIN K2 PO) Take by mouth.  . Multiple Vitamins-Minerals (MULTIVITAMIN WOMEN 50+ PO) Take by mouth daily.  . Omega-3 Fatty Acids (FISH OIL) 1200 MG CAPS Take 1,290 mg by mouth daily.  . Potassium 99 MG TABS Take 99 mg by mouth daily.  Marland Kitchen zinc gluconate 50 MG tablet Take 50 mg by mouth daily.  . [DISCONTINUED] amLODipine-benazepril (LOTREL) 5-10 MG capsule TAKE 1 CAPSULE DAILY  . [DISCONTINUED] escitalopram (LEXAPRO) 20 MG tablet Take 1 tablet (20 mg total) by mouth daily.   No facility-administered  medications prior to visit.    Review of Systems  Constitutional: Negative.   Respiratory: Negative.   Cardiovascular: Negative.   Neurological: Negative.   Psychiatric/Behavioral: Negative.     Last hemoglobin A1c No results found for: HGBA1C Last thyroid functions Lab Results  Component Value Date   TSH 1.890 03/04/2019   Last vitamin D No results found for: 25OHVITD2, 25OHVITD3, VD25OH Last vitamin B12  and Folate No results found for: VITAMINB12, FOLATE    Objective    BP (!) 134/58 (BP Location: Left Arm, Patient Position: Sitting, Cuff Size: Normal)   Pulse (!) 56   Temp 98.6 F (37 C) (Oral)   Resp 16   Ht 5\' 2"  (1.575 m)   Wt 159 lb (72.1 kg)   SpO2 100%   BMI 29.08 kg/m  BP Readings from Last 3 Encounters:  06/20/20 (!) 134/58  04/13/19 132/72  03/04/19 (!) 156/76   Wt Readings from Last 3 Encounters:  06/20/20 159 lb (72.1 kg)  04/13/19 165 lb (74.8 kg)  04/01/19 165 lb (74.8 kg)      Physical Exam Vitals reviewed.  Constitutional:      General: She is not in acute distress.    Appearance: Normal appearance. She is well-developed. She is not ill-appearing.  HENT:     Head: Normocephalic and atraumatic.  Eyes:     General: No scleral icterus.    Extraocular Movements: Extraocular movements intact.  Pulmonary:     Effort: Pulmonary effort is normal. No respiratory distress.  Musculoskeletal:     Cervical back: Normal range of motion and neck supple.  Neurological:     Mental Status: She is alert.  Psychiatric:        Mood and Affect: Mood normal.        Behavior: Behavior normal.        Thought Content: Thought content normal.        Judgment: Judgment normal.      No results found for any visits on 06/20/20.  Assessment & Plan     1. Seasonal affective disorder ([place]) Doing well. Decreased dose to 10mg  from 20mg  as below. Continue current dose.  - escitalopram (LEXAPRO) 10 MG tablet; Take 1 tablet (10 mg total) by mouth at bedtime.  Dispense: 90 tablet; Refill: 3  2. Essential hypertension Stable. Diagnosis pulled for medication refill. Continue current medical treatment plan. Will check labs as below and f/u pending results. - amLODipine-benazepril (LOTREL) 5-10 MG capsule; Take 1 capsule by mouth daily.  Dispense: 90 capsule; Refill: 3 - CBC w/Diff/Platelet - Comprehensive Metabolic Panel (CMET) - Lipid Panel With LDL/HDL Ratio  3. Need for  influenza vaccination Flu vaccine given today without complication. Patient sat upright for 15 minutes to check for adverse reaction before being released. - Flu Vaccine QUAD High Dose(Fluad)  4. BMI 29.0-29.9,adult Counseled patient on healthy lifestyle modifications including dieting and exercise.  Doing well. Changing diet, increasing activity.  - Comprehensive Metabolic Panel (CMET) - Lipid Panel With LDL/HDL Ratio   Return in about 1 year (around 06/20/2021).      Reynolds Bowl, PA-C, have reviewed all documentation for this visit. The documentation on 06/21/20 for the exam, diagnosis, procedures, and orders are all accurate and complete.   Rubye Beach  [facility] [phone] (phone) [phone] (fax)  [place]

## 2020-06-20 ENCOUNTER — Other Ambulatory Visit: Payer: Self-pay

## 2020-06-20 ENCOUNTER — Encounter: Payer: Self-pay | Admitting: Physician Assistant

## 2020-06-20 ENCOUNTER — Ambulatory Visit (INDEPENDENT_AMBULATORY_CARE_PROVIDER_SITE_OTHER): Payer: Managed Care, Other (non HMO) | Admitting: Physician Assistant

## 2020-06-20 VITALS — BP 134/58 | HR 56 | Temp 98.6°F | Resp 16 | Ht 62.0 in | Wt 159.0 lb

## 2020-06-20 DIAGNOSIS — I10 Essential (primary) hypertension: Secondary | ICD-10-CM | POA: Diagnosis not present

## 2020-06-20 DIAGNOSIS — F338 Other recurrent depressive disorders: Secondary | ICD-10-CM

## 2020-06-20 DIAGNOSIS — Z23 Encounter for immunization: Secondary | ICD-10-CM

## 2020-06-20 DIAGNOSIS — Z6829 Body mass index (BMI) 29.0-29.9, adult: Secondary | ICD-10-CM

## 2020-06-20 MED ORDER — ESCITALOPRAM OXALATE 10 MG PO TABS: 10.0000 mg | ORAL_TABLET | Freq: Every day | ORAL | 3 refills | Status: DC

## 2020-06-20 MED ORDER — AMLODIPINE BESY-BENAZEPRIL HCL 5-10 MG PO CAPS: 1.0000 | ORAL_CAPSULE | Freq: Every day | ORAL | 3 refills | Status: DC

## 2020-06-20 NOTE — Patient Instructions (Addendum)
Influenza (Flu) Vaccine (Inactivated or Recombinant): What You Need to Know 1. Why get vaccinated? Influenza vaccine can prevent influenza (flu). Flu is a contagious disease that spreads around the [place] every year, usually between October and May. Anyone can get the flu, but it is more dangerous for some people. Infants and young children, people 70 years of age and older, pregnant women, and people with certain health conditions or a weakened immune system are at greatest risk of flu complications. Pneumonia, bronchitis, sinus infections and ear infections are examples of flu-related complications. If you have a medical condition, such as heart disease, cancer or diabetes, flu can make it worse. Flu can cause fever and chills, sore throat, muscle aches, fatigue, cough, headache, and runny or stuffy nose. Some people may have vomiting and diarrhea, though this is more common in children than adults. Each year thousands of people in the [place] die from flu, and many more are hospitalized. Flu vaccine prevents millions of illnesses and flu-related visits to the doctor each year. 2. Influenza vaccine CDC recommends everyone 6 months of age and older get vaccinated every flu season. Children 6 months through 8 years of age may need 2 doses during a single flu season. Everyone else needs only 1 dose each flu season. It takes about 2 weeks for protection to develop after vaccination. There are many flu viruses, and they are always changing. Each year a new flu vaccine is made to protect against three or four viruses that are likely to cause disease in the upcoming flu season. Even when the vaccine doesn't exactly match these viruses, it may still provide some protection. Influenza vaccine does not cause flu. Influenza vaccine may be given at the same time as other vaccines. 3. Talk with your health care provider Tell your vaccine provider if the person getting the vaccine:  Has had an  allergic reaction after a previous dose of influenza vaccine, or has any severe, life-threatening allergies.  Has ever had Guillain-Barr Syndrome (also called GBS). In some cases, your health care provider may decide to postpone influenza vaccination to a future visit. People with minor illnesses, such as a cold, may be vaccinated. People who are moderately or severely ill should usually wait until they recover before getting influenza vaccine. Your health care provider can give you more information. 4. Risks of a vaccine reaction  Soreness, redness, and swelling where shot is given, fever, muscle aches, and headache can happen after influenza vaccine.  There may be a very small increased risk of Guillain-Barr Syndrome (GBS) after inactivated influenza vaccine (the flu shot). Young children who get the flu shot along with pneumococcal vaccine (PCV13), and/or DTaP vaccine at the same time might be slightly more likely to have a seizure caused by fever. Tell your health care provider if a child who is getting flu vaccine has ever had a seizure. People sometimes faint after medical procedures, including vaccination. Tell your provider if you feel dizzy or have vision changes or ringing in the ears. As with any medicine, there is a very remote chance of a vaccine causing a severe allergic reaction, other serious injury, or death. 5. What if there is a serious problem? An allergic reaction could occur after the vaccinated person leaves the clinic. If you see signs of a severe allergic reaction (hives, swelling of the face and throat, difficulty breathing, a fast heartbeat, dizziness, or weakness), call 9-1-1 and get the person to the nearest hospital. For other signs that   concern you, call your health care provider. Adverse reactions should be reported to the Vaccine Adverse Event Reporting System (VAERS). Your health care provider will usually file this report, or you can do it yourself. Visit the  VAERS website at www.vaers.hhs.gov or call 1-800-822-7967.VAERS is only for reporting reactions, and VAERS staff do not give medical advice. 6. The National Vaccine Injury Compensation Program The National Vaccine Injury Compensation Program (VICP) is a federal program that was created to compensate people who may have been injured by certain vaccines. Visit the VICP website at www.hrsa.gov/vaccinecompensation or call 1-800-338-2382 to learn about the program and about filing a claim. There is a time limit to file a claim for compensation. 7. How can I learn more?  Ask your healthcare provider.  Call your local or state health department.  Contact the Centers for Disease Control and Prevention (CDC): ? Call 1-800-232-4636 (1-800-CDC-INFO) or ? Visit CDC's www.cdc.gov/flu Vaccine Information Statement (Interim) Inactivated Influenza Vaccine (04/01/2018) This information is not intended to replace advice given to you by your health care provider. Make sure you discuss any questions you have with your health care provider. Document Revised: 11/23/2018 Document Reviewed: 04/05/2018 Elsevier Patient Education  2020 [employer] Inc. Hypertension, Adult High blood pressure (hypertension) is when the force of blood pumping through the arteries is too strong. The arteries are the blood vessels that carry blood from the heart throughout the body. Hypertension forces the heart to work harder to pump blood and may cause arteries to become narrow or stiff. Untreated or uncontrolled hypertension can cause a heart attack, heart failure, a stroke, kidney disease, and other problems. A blood pressure reading consists of a higher number over a lower number. Ideally, your blood pressure should be below 120/80. The first ("top") number is called the systolic pressure. It is a measure of the pressure in your arteries as your heart beats. The second ("bottom") number is called the diastolic pressure. It is a measure of the  pressure in your arteries as the heart relaxes. What are the causes? The exact cause of this condition is not known. There are some conditions that result in or are related to high blood pressure. What increases the risk? Some risk factors for high blood pressure are under your control. The following factors may make you more likely to develop this condition:  Smoking.  Having type 2 diabetes mellitus, high cholesterol, or both.  Not getting enough exercise or physical activity.  Being overweight.  Having too much fat, sugar, calories, or salt (sodium) in your diet.  Drinking too much alcohol. Some risk factors for high blood pressure may be difficult or impossible to change. Some of these factors include:  Having chronic kidney disease.  Having a family history of high blood pressure.  Age. Risk increases with age.  Race. You may be at higher risk if you are African American.  Gender. Men are at higher risk than women before age 45. After age 70, women are at higher risk than men.  Having obstructive sleep apnea.  Stress. What are the signs or symptoms? High blood pressure may not cause symptoms. Very high blood pressure (hypertensive crisis) may cause:  Headache.  Anxiety.  Shortness of breath.  Nosebleed.  Nausea and vomiting.  Vision changes.  Severe chest pain.  Seizures. How is this diagnosed? This condition is diagnosed by measuring your blood pressure while you are seated, with your arm resting on a flat surface, your legs uncrossed, and your feet flat on   the floor. The cuff of the blood pressure monitor will be placed directly against the skin of your upper arm at the level of your heart. It should be measured at least twice using the same arm. Certain conditions can cause a difference in blood pressure between your right and left arms. Certain factors can cause blood pressure readings to be lower or higher than normal for a short period of time:  When  your blood pressure is higher when you are in a health care provider's office than when you are at home, this is called white coat hypertension. Most people with this condition do not need medicines.  When your blood pressure is higher at home than when you are in a health care provider's office, this is called masked hypertension. Most people with this condition may need medicines to control blood pressure. If you have a high blood pressure reading during one visit or you have normal blood pressure with other risk factors, you may be asked to:  Return on a different day to have your blood pressure checked again.  Monitor your blood pressure at home for 1 week or longer. If you are diagnosed with hypertension, you may have other blood or imaging tests to help your health care provider understand your overall risk for other conditions. How is this treated? This condition is treated by making healthy lifestyle changes, such as eating healthy foods, exercising more, and reducing your alcohol intake. Your health care provider may prescribe medicine if lifestyle changes are not enough to get your blood pressure under control, and if:  Your systolic blood pressure is above 130.  Your diastolic blood pressure is above 80. Your personal target blood pressure may vary depending on your medical conditions, your age, and other factors. Follow these instructions at home: Eating and drinking   Eat a diet that is high in fiber and potassium, and low in sodium, added sugar, and fat. An example eating plan is called the DASH (Dietary Approaches to Stop Hypertension) diet. To eat this way: ? Eat plenty of fresh fruits and vegetables. Try to fill one half of your plate at each meal with fruits and vegetables. ? Eat whole grains, such as whole-wheat pasta, brown rice, or whole-grain bread. Fill about one fourth of your plate with whole grains. ? Eat or drink low-fat dairy products, such as skim milk or low-fat  yogurt. ? Avoid fatty cuts of meat, processed or cured meats, and poultry with skin. Fill about one fourth of your plate with lean proteins, such as fish, chicken without skin, beans, eggs, or tofu. ? Avoid pre-made and processed foods. These tend to be higher in sodium, added sugar, and fat.  Reduce your daily sodium intake. Most people with hypertension should eat less than 1,500 mg of sodium a day.  Do not drink alcohol if: ? Your health care provider tells you not to drink. ? You are pregnant, may be pregnant, or are planning to become pregnant.  If you drink alcohol: ? Limit how much you use to:  0-1 drink a day for women.  0-2 drinks a day for men. ? Be aware of how much alcohol is in your drink. In the [place] one drink equals one 12 oz bottle of beer (355 mL), one 5 oz glass of wine (148 mL), or one 1 oz glass of hard liquor (44 mL). Lifestyle   Work with your health care provider to maintain a healthy body weight or to lose weight. Ask   what an ideal weight is for you.  Get at least 30 minutes of exercise most days of the week. Activities may include walking, swimming, or biking.  Include exercise to strengthen your muscles (resistance exercise), such as Pilates or lifting weights, as part of your weekly exercise routine. Try to do these types of exercises for 30 minutes at least 3 days a week.  Do not use any products that contain nicotine or tobacco, such as cigarettes, e-cigarettes, and chewing tobacco. If you need help quitting, ask your health care provider.  Monitor your blood pressure at home as told by your health care provider.  Keep all follow-up visits as told by your health care provider. This is important. Medicines  Take over-the-counter and prescription medicines only as told by your health care provider. Follow directions carefully. Blood pressure medicines must be taken as prescribed.  Do not skip doses of blood pressure medicine. Doing this puts you at risk  for problems and can make the medicine less effective.  Ask your health care provider about side effects or reactions to medicines that you should watch for. Contact a health care provider if you:  Think you are having a reaction to a medicine you are taking.  Have headaches that keep coming back (recurring).  Feel dizzy.  Have swelling in your ankles.  Have trouble with your vision. Get help right away if you:  Develop a severe headache or confusion.  Have unusual weakness or numbness.  Feel faint.  Have severe pain in your chest or abdomen.  Vomit repeatedly.  Have trouble breathing. Summary  Hypertension is when the force of blood pumping through your arteries is too strong. If this condition is not controlled, it may put you at risk for serious complications.  Your personal target blood pressure may vary depending on your medical conditions, your age, and other factors. For most people, a normal blood pressure is less than 120/80.  Hypertension is treated with lifestyle changes, medicines, or a combination of both. Lifestyle changes include losing weight, eating a healthy, low-sodium diet, exercising more, and limiting alcohol. This information is not intended to replace advice given to you by your health care provider. Make sure you discuss any questions you have with your health care provider. Document Revised: 04/14/2018 Document Reviewed: 04/14/2018 Elsevier Patient Education  2020 Elsevier Inc.  

## 2020-06-21 ENCOUNTER — Telehealth: Payer: Self-pay

## 2020-06-21 LAB — CBC WITH DIFFERENTIAL/PLATELET
Basophils Absolute: 0 10*3/uL (ref 0.0–0.2)
Basos: 1 %
EOS (ABSOLUTE): 0.3 10*3/uL (ref 0.0–0.4)
Eos: 4 %
Hematocrit: 42.6 % (ref 34.0–46.6)
Hemoglobin: 13.7 g/dL (ref 11.1–15.9)
Immature Grans (Abs): 0 10*3/uL (ref 0.0–0.1)
Immature Granulocytes: 0 %
Lymphocytes Absolute: 1.5 10*3/uL (ref 0.7–3.1)
Lymphs: 24 %
MCH: 28.2 pg (ref 26.6–33.0)
MCHC: 32.2 g/dL (ref 31.5–35.7)
MCV: 88 fL (ref 79–97)
Monocytes Absolute: 0.5 10*3/uL (ref 0.1–0.9)
Monocytes: 8 %
Neutrophils Absolute: 4 10*3/uL (ref 1.4–7.0)
Neutrophils: 63 %
Platelets: 266 10*3/uL (ref 150–450)
RBC: 4.86 x10E6/uL (ref 3.77–5.28)
RDW: 12.8 % (ref 11.7–15.4)
WBC: 6.3 10*3/uL (ref 3.4–10.8)

## 2020-06-21 LAB — COMPREHENSIVE METABOLIC PANEL
ALT: 17 IU/L (ref 0–32)
AST: 16 IU/L (ref 0–40)
Albumin/Globulin Ratio: 2 (ref 1.2–2.2)
Albumin: 4.5 g/dL (ref 3.8–4.8)
Alkaline Phosphatase: 99 IU/L (ref 44–121)
BUN/Creatinine Ratio: 14 (ref 12–28)
BUN: 13 mg/dL (ref 8–27)
Bilirubin Total: 0.5 mg/dL (ref 0.0–1.2)
CO2: 26 mmol/L (ref 20–29)
Calcium: 9.7 mg/dL (ref 8.7–10.3)
Chloride: 101 mmol/L (ref 96–106)
Creatinine, Ser: 0.92 mg/dL (ref 0.57–1.00)
GFR calc Af Amer: 73 mL/min/{1.73_m2} (ref >59)
GFR calc non Af Amer: 63 mL/min/{1.73_m2} (ref >59)
Globulin, Total: 2.2 g/dL (ref 1.5–4.5)
Glucose: 83 mg/dL (ref 65–99)
Potassium: 4.4 mmol/L (ref 3.5–5.2)
Sodium: 140 mmol/L (ref 134–144)
Total Protein: 6.7 g/dL (ref 6.0–8.5)

## 2020-06-21 LAB — LIPID PANEL WITH LDL/HDL RATIO
Cholesterol, Total: 215 mg/dL — ABNORMAL HIGH (ref 100–199)
HDL: 63 mg/dL (ref >39)
LDL Chol Calc (NIH): 135 mg/dL — ABNORMAL HIGH (ref 0–99)
LDL/HDL Ratio: 2.1 ratio (ref 0.0–3.2)
Triglycerides: 98 mg/dL (ref 0–149)
VLDL Cholesterol Cal: 17 mg/dL (ref 5–40)

## 2020-06-21 NOTE — Telephone Encounter (Signed)
Written by Mar Daring, PA-C on 06/21/2020 9:24 AM EDT Seen by patient [name] on 06/21/2020 9:51 AM

## 2020-06-21 NOTE — Telephone Encounter (Signed)
-----   Message from Mar Daring, [place] sent at 06/21/2020  9:24 AM EDT ----- Blood count is normal. Kidney and liver function are normal. Sugar is normal. Sodium, potassium, and calcium are normal. Cholesterol has improved since last check. Still borderline elevated, but no need for cholesterol lowering medication at this time. Continue your healthy lifestyle modifications.

## 2020-07-19 DIAGNOSIS — Z23 Encounter for immunization: Secondary | ICD-10-CM | POA: Diagnosis not present

## 2020-09-22 ENCOUNTER — Encounter: Payer: Self-pay | Admitting: Physician Assistant

## 2020-09-24 MED ORDER — AMLODIPINE BESY-BENAZEPRIL HCL 10-20 MG PO CAPS: 1.0000 | ORAL_CAPSULE | Freq: Every day | ORAL | 3 refills | Status: DC

## 2020-10-10 ENCOUNTER — Telehealth: Payer: Self-pay | Admitting: Physician Assistant

## 2020-10-10 MED ORDER — AMLODIPINE BESY-BENAZEPRIL HCL 10-20 MG PO CAPS: 1.0000 | ORAL_CAPSULE | Freq: Every day | ORAL | 1 refills | Status: DC

## 2020-10-10 NOTE — Telephone Encounter (Signed)
refilled 

## 2020-10-10 NOTE — Telephone Encounter (Signed)
[facility] faxed refill request for the following medications:  amLODipine-benazepril (LOTREL) 10-20 MG capsule  Last Rx: 09/24/20 [facility] LOV: 06/20/20 Please advise. Thanks TNP

## 2020-10-10 NOTE — Addendum Note (Signed)
Addended by: Mar Daring on: 10/10/2020 05:52 PM   Modules accepted: Orders

## 2020-10-15 ENCOUNTER — Other Ambulatory Visit: Payer: Self-pay

## 2020-10-15 ENCOUNTER — Ambulatory Visit (INDEPENDENT_AMBULATORY_CARE_PROVIDER_SITE_OTHER): Payer: Managed Care, Other (non HMO) | Admitting: Dermatology

## 2020-10-15 DIAGNOSIS — L821 Other seborrheic keratosis: Secondary | ICD-10-CM | POA: Diagnosis not present

## 2020-10-15 DIAGNOSIS — L820 Inflamed seborrheic keratosis: Secondary | ICD-10-CM

## 2020-10-15 DIAGNOSIS — L814 Other melanin hyperpigmentation: Secondary | ICD-10-CM

## 2020-10-15 DIAGNOSIS — L570 Actinic keratosis: Secondary | ICD-10-CM

## 2020-10-15 DIAGNOSIS — L578 Other skin changes due to chronic exposure to nonionizing radiation: Secondary | ICD-10-CM

## 2020-10-15 NOTE — Progress Notes (Signed)
Follow-Up Visit   Subjective  [name] is a 70 y.o. female who presents for the following: check sun exposed areas (Patient would like her face, neck, chest, arms, and hands checked today).  The following portions of the chart were reviewed this encounter and updated as appropriate:   Tobacco  Allergies  Meds  Problems  Med Hx  Surg Hx  Fam Hx     Review of Systems:  No other skin or systemic complaints except as noted in HPI or Assessment and Plan.  Objective  Well appearing patient in no apparent distress; mood and affect are within normal limits.  A focused examination was performed including the face, neck, chest, arms, and hands. Relevant physical exam findings are noted in the Assessment and Plan.  Objective  R temple x 1, Below the right nasal bridge x 1, chest x 8 (10): Erythematous thin papules/macules with gritty scale.   Objective  L arm x 1: Erythematous keratotic or waxy stuck-on papule or plaque.   Assessment & Plan  AK (actinic keratosis) (10) R temple x 1, Below the right nasal bridge x 1, chest x 8  Plan to treat the arms after her surgery in mid March.   Destruction of lesion - R temple x 1, Below the right nasal bridge x 1, chest x 8 Complexity: simple   Destruction method: cryotherapy   Informed consent: discussed and consent obtained   Timeout:  patient name, date of birth, surgical site, and procedure verified Lesion destroyed using liquid nitrogen: Yes   Region frozen until ice ball extended beyond lesion: Yes   Outcome: patient tolerated procedure well with no complications   Post-procedure details: wound care instructions given    Inflamed seborrheic keratosis L arm x 1  Destruction of lesion - L arm x 1 Complexity: simple   Destruction method: cryotherapy   Informed consent: discussed and consent obtained   Timeout:  patient name, date of birth, surgical site, and procedure verified Lesion destroyed using liquid nitrogen: Yes    Region frozen until ice ball extended beyond lesion: Yes   Outcome: patient tolerated procedure well with no complications   Post-procedure details: wound care instructions given     Actinic Damage - chronic, secondary to cumulative UV radiation exposure/sun exposure over time - diffuse scaly erythematous macules with underlying dyspigmentation - Recommend daily broad spectrum sunscreen SPF 30+ to sun-exposed areas, reapply every 2 hours as needed.  - Call for new or changing lesions.  Seborrheic Keratoses - Stuck-on, waxy, tan-brown papules and plaques  - Discussed benign etiology and prognosis. - Observe - Call for any changes  Lentigines - Scattered tan macules - Due to sun exposure - Benign-appering, observe - Recommend daily broad spectrum sunscreen SPF 30+ to sun-exposed areas, reapply every 2 hours as needed. - Call for any changes  Severe, Confluent Chronic Actinic Changes with Pre-Cancerous Actinic Keratoses due to cumulative sun exposure/UV radiation exposure over time - Discussed Prescription "Field Treatment" Field treatment involves treatment of an entire area of skin that has confluent Actinic Changes (Sun/ Ultraviolet light damage) and PreCancerous Actinic Keratoses by method of PhotoDynamic Therapy (PDT) and/or prescription Topical Chemotherapy agents such as 5-fluorouracil, 5-fluorouracil/calcipotriene, and/or imiquimod.  The purpose is to decrease the number of clinically evident and subclinical PreCancerous lesions to prevent progression to development of skin cancer by chemically destroying early precancer changes that may or may not be visible.  It has been shown to reduce the risk of developing skin  cancer in the treated area. As a result of treatment, redness, scaling, crusting, and open sores may occur during treatment course. One or more than one of these methods may be used and may have to be used several times to control, suppress and eliminate the PreCancerous  changes. Discussed treatment course, expected reaction, and possible side effects.  Return in about 6 weeks (around 11/26/2020) for AK treatment of the arms.  Luther Redo, CMA, am acting as scribe for Sarina Ser, MD .  Documentation: I have reviewed the above documentation for accuracy and completeness, and I agree with the above.  Sarina Ser, MD

## 2020-10-16 ENCOUNTER — Encounter: Payer: Self-pay | Admitting: Dermatology

## 2020-12-12 ENCOUNTER — Ambulatory Visit: Payer: Managed Care, Other (non HMO) | Admitting: Dermatology

## 2021-01-10 DIAGNOSIS — H02834 Dermatochalasis of left upper eyelid: Secondary | ICD-10-CM | POA: Diagnosis not present

## 2021-01-10 DIAGNOSIS — H02831 Dermatochalasis of right upper eyelid: Secondary | ICD-10-CM | POA: Diagnosis not present

## 2021-06-19 ENCOUNTER — Other Ambulatory Visit: Payer: Self-pay | Admitting: Physician Assistant

## 2021-06-19 DIAGNOSIS — F338 Other recurrent depressive disorders: Secondary | ICD-10-CM

## 2021-06-28 NOTE — Telephone Encounter (Signed)
LMTCB to schedule appt. Last ov 06/20/20

## 2021-07-18 ENCOUNTER — Telehealth (INDEPENDENT_AMBULATORY_CARE_PROVIDER_SITE_OTHER): Payer: Managed Care, Other (non HMO) | Admitting: Family Medicine

## 2021-07-18 ENCOUNTER — Encounter: Payer: Self-pay | Admitting: Family Medicine

## 2021-07-18 VITALS — BP 155/66 | HR 59 | Wt 159.0 lb

## 2021-07-18 DIAGNOSIS — I10 Essential (primary) hypertension: Secondary | ICD-10-CM | POA: Diagnosis not present

## 2021-07-18 DIAGNOSIS — F419 Anxiety disorder, unspecified: Secondary | ICD-10-CM | POA: Diagnosis not present

## 2021-07-18 DIAGNOSIS — F339 Major depressive disorder, recurrent, unspecified: Secondary | ICD-10-CM | POA: Diagnosis not present

## 2021-07-18 DIAGNOSIS — F338 Other recurrent depressive disorders: Secondary | ICD-10-CM | POA: Diagnosis not present

## 2021-07-18 DIAGNOSIS — K219 Gastro-esophageal reflux disease without esophagitis: Secondary | ICD-10-CM | POA: Diagnosis not present

## 2021-07-18 DIAGNOSIS — Z789 Other specified health status: Secondary | ICD-10-CM | POA: Diagnosis not present

## 2021-07-18 MED ORDER — ESCITALOPRAM OXALATE 10 MG PO TABS: 10.0000 mg | ORAL_TABLET | Freq: Every day | ORAL | 3 refills | Status: DC

## 2021-07-18 MED ORDER — AMLODIPINE BESY-BENAZEPRIL HCL 10-40 MG PO CAPS: 1.0000 | ORAL_CAPSULE | Freq: Every day | ORAL | 3 refills | Status: DC

## 2021-07-18 MED ORDER — FAMOTIDINE 20 MG PO TABS: 20.0000 mg | ORAL_TABLET | Freq: Every day | ORAL | 3 refills | Status: DC

## 2021-07-18 NOTE — Assessment & Plan Note (Signed)
Chronic, stable Pulled for refill request

## 2021-07-18 NOTE — Progress Notes (Signed)
MyChart Video Visit    Virtual Visit via Video Note   This visit type was conducted due to national recommendations for restrictions regarding the COVID-19 Pandemic (e.g. social distancing) in an effort to limit this patient's exposure and mitigate transmission in our community. This patient is at least at moderate risk for complications without adequate follow up. This format is felt to be most appropriate for this patient at this time. Physical exam was limited by quality of the video and audio technology used for the visit.   Patient location: home kitchen Provider location: [facility]  I discussed the limitations of evaluation and management by telemedicine and the availability of in person appointments. The patient expressed understanding and agreed to proceed.  Patient: [name]   DOB: 03-14-1951   70 y.o. Female  MRN: [id number] Visit Date: 07/18/2021  Today's healthcare provider: Gwyneth Sprout, FNP   Chief Complaint  Patient presents with   Hypertension   Depression   Subjective    HPI  Hypertension, follow-up  BP Readings from Last 3 Encounters:  07/18/21 (!) 155/66  06/20/20 (!) 134/58  04/13/19 132/72   Wt Readings from Last 3 Encounters:  06/20/20 159 lb (72.1 kg)  04/13/19 165 lb (74.8 kg)  04/01/19 165 lb (74.8 kg)     She was last seen for hypertension 1  year  ago.  BP at that visit was 134/58. Management since that visit includes continuing same medication.  She reports good compliance with treatment. She is not having side effects.  She is following a Regular diet. She is not exercising. She does not smoke.  Use of agents associated with hypertension: none.   Outside blood pressures are checked occasionally. Symptoms: No chest pain No chest pressure  No palpitations No syncope  No dyspnea No orthopnea  No paroxysmal nocturnal dyspnea No lower extremity edema   Pertinent labs: Lab Results  Component Value Date   CHOL 215 (H) 06/20/2020    HDL 63 06/20/2020   LDLCALC 135 (H) 06/20/2020   TRIG 98 06/20/2020   CHOLHDL 3.4 03/04/2019   Lab Results  Component Value Date   NA 140 06/20/2020   K 4.4 06/20/2020   CREATININE 0.92 06/20/2020   GFRNONAA 63 06/20/2020   GLUCOSE 83 06/20/2020   TSH 1.890 03/04/2019     The 10-year ASCVD risk score (Arnett DK, et al., 2019) is: 19.8%   ---------------------------------------------------------------------------------------------------   Depression, Follow-up  She  was last seen for this 1  year  ago. Changes made at last visit include decreasing dose of Lexapro to 10mg  daily.    She reports good compliance with treatment. She is not having side effects.   She reports good tolerance of treatment. She feels she is Improved since last visit.  Depression screen [facility] 2/9 07/18/2021 06/20/2020 12/20/2019  Decreased Interest 0 0 0  Down, Depressed, Hopeless 0 0 0  PHQ - 2 Score 0 0 0  Altered sleeping - 0 -  Tired, decreased energy 0 0 -  Change in appetite 1 0 -  Feeling bad or failure about yourself  0 0 -  Trouble concentrating 0 0 -  Moving slowly or fidgety/restless 0 0 -  Suicidal thoughts 0 0 -  PHQ-9 Score - 0 -  Difficult doing work/chores Not difficult at all Not difficult at all -    -----------------------------------------------------------------------------------------    Medications: Outpatient Medications Prior to Visit  Medication Sig   Ascorbic Acid (VITAMIN C) 100  MG tablet Take by mouth daily.   Cholecalciferol (VITAMIN D3) 2000 units TABS Take 5,000 Units by mouth daily.   Menaquinone-7 (VITAMIN K2 PO) Take by mouth.   Omega-3 Fatty Acids (FISH OIL) 1200 MG CAPS Take 1,290 mg by mouth daily.   Potassium 99 MG TABS Take 99 mg by mouth daily.   Selenium (SELENICAPS-200 PO) Take 1 capsule by mouth daily.   zinc gluconate 50 MG tablet Take 50 mg by mouth daily.   [DISCONTINUED] amLODipine-benazepril (LOTREL) 10-20 MG capsule Take 1 capsule by mouth  daily.   [DISCONTINUED] escitalopram (LEXAPRO) 10 MG tablet TAKE 1 TABLET AT BEDTIME   [DISCONTINUED] famotidine (PEPCID) 20 MG tablet TAKE 1 TABLET BY MOUTH TWICE A DAY   [DISCONTINUED] Multiple Vitamins-Minerals (MULTIVITAMIN WOMEN 50+ PO) Take by mouth daily.   No facility-administered medications prior to visit.    Review of Systems  Constitutional:  Negative for appetite change, chills, fatigue and fever.  Respiratory:  Negative for chest tightness and shortness of breath.   Cardiovascular:  Negative for chest pain and palpitations.  Gastrointestinal:  Negative for abdominal pain, nausea and vomiting.  Neurological:  Negative for dizziness and weakness.     Objective    BP (!) 155/66   Pulse (!) 59    Physical Exam Vitals and nursing note reviewed.  Constitutional:      Appearance: Normal appearance.  HENT:     Head: Normocephalic and atraumatic.  Cardiovascular:     Rate and Rhythm: Bradycardia present.  Pulmonary:     Effort: Pulmonary effort is normal.  Neurological:     General: No focal deficit present.     Mental Status: She is alert.  Psychiatric:        Mood and Affect: Mood normal.       Assessment & Plan     Problem List Items Addressed This Visit       Cardiovascular and Mediastinum   Benign essential HTN    Slight elevation; pt appears to be taking an old Rx which was not adjusted to recent dose change from 2/22 Possibly missing doses given unaware and going on 9 months since 2/22      Relevant Medications   amLODipine-benazepril (LOTREL) 10-40 MG capsule     Digestive   Gastroesophageal reflux disease without esophagitis    Pulled fore refill request      Relevant Medications   famotidine (PEPCID) 20 MG tablet     Other   Anxiety    Chronic, stable Pulled for refill request      Relevant Medications   escitalopram (LEXAPRO) 10 MG tablet   Clinical depression    Chronic, stable Pulled for refill request      Relevant  Medications   escitalopram (LEXAPRO) 10 MG tablet   Seasonal affective disorder (HCC)    Chronic, stable Pulled for refill request      Relevant Medications   escitalopram (LEXAPRO) 10 MG tablet   Vegan diet    Difficulty following d/t thanksgiving holiday- and being with her children, who are all following a keto diet      Other Visit Diagnoses     Essential hypertension    -  Primary   Relevant Medications   amLODipine-benazepril (LOTREL) 10-40 MG capsule        Return in about 4 weeks (around 08/15/2021) for HTN management.     I discussed the assessment and treatment plan with the patient. The patient was provided an opportunity to  ask questions and all were answered. The patient agreed with the plan and demonstrated an understanding of the instructions.   The patient was advised to call back or seek an in-person evaluation if the symptoms worsen or if the condition fails to improve as anticipated.  I provided 8 minutes of non-face-to-face time during this encounter.  Vonna Kotyk, FNP, have reviewed all documentation for this visit. The documentation on 07/18/21 for the exam, diagnosis, procedures, and orders are all accurate and complete.   Gwyneth Sprout, [place] [phone] (phone) [phone] (fax)  [place]

## 2021-07-18 NOTE — Assessment & Plan Note (Signed)
Slight elevation; pt appears to be taking an old Rx which was not adjusted to recent dose change from 2/22 Possibly missing doses given unaware and going on 9 months since 2/22

## 2021-07-18 NOTE — Assessment & Plan Note (Signed)
Pulled fore refill request

## 2021-07-18 NOTE — Assessment & Plan Note (Signed)
Difficulty following d/t thanksgiving holiday- and being with her children, who are all following a keto diet

## 2021-08-26 ENCOUNTER — Other Ambulatory Visit: Payer: Self-pay

## 2021-08-26 ENCOUNTER — Ambulatory Visit (INDEPENDENT_AMBULATORY_CARE_PROVIDER_SITE_OTHER): Payer: Managed Care, Other (non HMO) | Admitting: Dermatology

## 2021-08-26 DIAGNOSIS — L820 Inflamed seborrheic keratosis: Secondary | ICD-10-CM | POA: Diagnosis not present

## 2021-08-26 DIAGNOSIS — L570 Actinic keratosis: Secondary | ICD-10-CM

## 2021-08-26 DIAGNOSIS — L578 Other skin changes due to chronic exposure to nonionizing radiation: Secondary | ICD-10-CM

## 2021-08-26 MED ORDER — FLUOROURACIL 5 % EX CREA: TOPICAL_CREAM | Freq: Two times a day (BID) | CUTANEOUS | 1 refills | Status: DC

## 2021-08-26 NOTE — Progress Notes (Signed)
Follow-Up Visit   Subjective  [name] is a 71 y.o. female who presents for the following: Follow-up (Patient here today for ak follow up. She reports some spots at lower legs, back, arms and face. ). The patient has spots, moles and lesions to be evaluated, some may be new or changing and the patient has concerns that these could be cancer.  The following portions of the chart were reviewed this encounter and updated as appropriate:  Tobacco   Allergies   Meds   Problems   Med Hx   Surg Hx   Fam Hx      Review of Systems: No other skin or systemic complaints except as noted in HPI or Assessment and Plan.  Objective  Well appearing patient in no apparent distress; mood and affect are within normal limits.  A focused examination was performed including face, back, arms, and back. Relevant physical exam findings are noted in the Assessment and Plan.  bilateral arms and hands x 15, nose x 2 (17) Erythematous thin papules/macules with gritty scale.   right forearm x 1, bilateral legs x 16, back x 1 (18) Erythematous stuck-on, waxy papule or plaque   Assessment & Plan  Actinic keratosis (17) bilateral arms and hands x 15, nose x 2  Actinic keratoses are precancerous spots that appear secondary to cumulative UV radiation exposure/sun exposure over time. They are chronic with expected duration over 1 year. A portion of actinic keratoses will progress to squamous cell carcinoma of the skin. It is not possible to reliably predict which spots will progress to skin cancer and so treatment is recommended to prevent development of skin cancer.  Recommend daily broad spectrum sunscreen SPF 30+ to sun-exposed areas, reapply every 2 hours as needed.  Recommend staying in the shade or wearing long sleeves, sun glasses (UVA+UVB protection) and wide brim hats (4-inch brim around the entire circumference of the hat). Call for new or changing lesions.  - Start 5-fluorouracil/calcipotriene cream  twice a day for 7 days to affected areas including top of arms and top of hands. Prescription sent to [facility]. Patient advised they will receive an email to purchase the medication online and have it sent to their home. Patient provided with handout reviewing treatment course and side effects and advised to call or message [place] on MyChart with any concerns.  Destruction of lesion - bilateral arms and hands x 15, nose x 2 Complexity: simple   Destruction method: cryotherapy   Informed consent: discussed and consent obtained   Timeout:  patient name, date of birth, surgical site, and procedure verified Lesion destroyed using liquid nitrogen: Yes   Region frozen until ice ball extended beyond lesion: Yes   Outcome: patient tolerated procedure well with no complications   Post-procedure details: wound care instructions given   Additional details:  Prior to procedure, discussed risks of blister formation, small wound, skin dyspigmentation, or rare scar following cryotherapy. Recommend Vaseline ointment to treated areas while healing.  Related Medications fluorouracil (EFUDEX) 5 % cream Apply topically 2 (two) times daily. Use for a week at tops of arms and top of hands  Inflamed seborrheic keratosis (18) right forearm x 1, bilateral legs x 16, back x 1 Irritated  Destruction of lesion - right forearm x 1, bilateral legs x 16, back x 1 Complexity: simple   Destruction method: cryotherapy   Informed consent: discussed and consent obtained   Timeout:  patient name, date of birth, surgical  site, and procedure verified Lesion destroyed using liquid nitrogen: Yes   Region frozen until ice ball extended beyond lesion: Yes   Outcome: patient tolerated procedure well with no complications   Post-procedure details: wound care instructions given   Additional details:  Prior to procedure, discussed risks of blister formation, small wound, skin dyspigmentation, or rare scar  following cryotherapy. Recommend Vaseline ointment to treated areas while healing.  Actinic Damage - Severe, confluent actinic changes with pre-cancerous actinic keratoses  - Severe, chronic, not at goal, secondary to cumulative UV radiation exposure over time - diffuse scaly erythematous macules and papules with underlying dyspigmentation - Discussed Prescription "Field Treatment" for Severe, Chronic Confluent Actinic Changes with Pre-Cancerous Actinic Keratoses Field treatment involves treatment of an entire area of skin that has confluent Actinic Changes (Sun/ Ultraviolet light damage) and PreCancerous Actinic Keratoses by method of PhotoDynamic Therapy (PDT) and/or prescription Topical Chemotherapy agents such as 5-fluorouracil, 5-fluorouracil/calcipotriene, and/or imiquimod.  The purpose is to decrease the number of clinically evident and subclinical PreCancerous lesions to prevent progression to development of skin cancer by chemically destroying early precancer changes that may or may not be visible.  It has been shown to reduce the risk of developing skin cancer in the treated area. As a result of treatment, redness, scaling, crusting, and open sores may occur during treatment course. One or more than one of these methods may be used and may have to be used several times to control, suppress and eliminate the PreCancerous changes. Discussed treatment course, expected reaction, and possible side effects. - Recommend daily broad spectrum sunscreen SPF 30+ to sun-exposed areas, reapply every 2 hours as needed.  - Staying in the shade or wearing long sleeves, sun glasses (UVA+UVB protection) and wide brim hats (4-inch brim around the entire circumference of the hat) are also recommended. - Call for new or changing lesions.  - Start 5-fluorouracil/calcipotriene cream twice a day for 7 days to affected areas including to the top of arms and tops hands . Prescription sent to [facility]. Patient advised they will receive an email to purchase the medication online and have it sent to their home. Patient provided with handout reviewing treatment course and side effects and advised to call or message [place] on MyChart with any concerns.  Return for 4 - 6 month ak follow up.  IRuthell Rummage, CMA, am acting as scribe for Sarina Ser, MD. Documentation: I have reviewed the above documentation for accuracy and completeness, and I agree with the above.  Sarina Ser, MD

## 2021-08-26 NOTE — Patient Instructions (Signed)
Start 5-fluorouracil/calcipotriene cream twice a day for 7 days to affected areas including to the top of arms and tops hands . Prescription sent to [facility]. Patient advised they will receive an email to purchase the medication online and have it sent to their home. Patient provided with handout reviewing treatment course and side effects and advised to call or message [place] on MyChart with any concerns.  5-Fluorouracil/Calcipotriene Patient Education   Actinic keratoses are the dry, red scaly spots on the skin caused by sun damage. A portion of these spots can turn into skin cancer with time, and treating them can help prevent development of skin cancer.   Treatment of these spots requires removal of the defective skin cells. There are various ways to remove actinic keratoses, including freezing with liquid nitrogen, treatment with creams, or treatment with a blue light procedure in the office.   5-fluorouracil cream is a topical cream used to treat actinic keratoses. It works by interfering with the growth of abnormal fast-growing skin cells, such as actinic keratoses. These cells peel off and are replaced by healthy ones.   5-fluorouracil/calcipotriene is a combination of the 5-fluorouracil cream with a vitamin D analog cream called calcipotriene. The calcipotriene alone does not treat actinic keratoses. However, when it is combined with 5-fluorouracil, it helps the 5-fluorouracil treat the actinic keratoses much faster so that the same results can be achieved with a much shorter treatment time.  INSTRUCTIONS FOR 5-FLUOROURACIL/CALCIPOTRIENE CREAM:   5-fluorouracil/calcipotriene cream typically only needs to be used for 4-7 days. A thin layer should be applied twice a day to the treatment areas recommended by your physician.   If your physician prescribed you separate tubes of 5-fluourouracil and calcipotriene, apply a thin layer of 5-fluorouracil followed by a thin  layer of calcipotriene.   Avoid contact with your eyes, nostrils, and mouth. Do not use 5-fluorouracil/calcipotriene cream on infected or open wounds.   You will develop redness, irritation and some crusting at areas where you have pre-cancer damage/actinic keratoses. IF YOU DEVELOP PAIN, BLEEDING, OR SIGNIFICANT CRUSTING, STOP THE TREATMENT EARLY - you have already gotten a good response and the actinic keratoses should clear up well.  Wash your hands after applying 5-fluorouracil 5% cream on your skin.   A moisturizer or sunscreen with a minimum SPF 30 should be applied each morning.   Once you have finished the treatment, you can apply a thin layer of Vaseline twice a day to irritated areas to soothe and calm the areas more quickly. If you experience significant discomfort, contact your physician.  For some patients it is necessary to repeat the treatment for best results.  SIDE EFFECTS: When using 5-fluorouracil/calcipotriene cream, you may have mild irritation, such as redness, dryness, swelling, or a mild burning sensation. This usually resolves within 2 weeks. The more actinic keratoses you have, the more redness and inflammation you can expect during treatment. Eye irritation has been reported rarely. If this occurs, please let us know.  If you have any trouble using this cream, please call the office. If you have any other questions about this information, please do not hesitate to ask me before you leave the office.

## 2021-08-27 ENCOUNTER — Encounter: Payer: Self-pay | Admitting: Dermatology

## 2021-10-28 DIAGNOSIS — U071 COVID-19: Secondary | ICD-10-CM | POA: Diagnosis not present

## 2022-01-06 ENCOUNTER — Ambulatory Visit (INDEPENDENT_AMBULATORY_CARE_PROVIDER_SITE_OTHER): Payer: Managed Care, Other (non HMO) | Admitting: Dermatology

## 2022-01-06 DIAGNOSIS — L570 Actinic keratosis: Secondary | ICD-10-CM

## 2022-01-06 DIAGNOSIS — L820 Inflamed seborrheic keratosis: Secondary | ICD-10-CM

## 2022-01-06 DIAGNOSIS — L578 Other skin changes due to chronic exposure to nonionizing radiation: Secondary | ICD-10-CM | POA: Diagnosis not present

## 2022-01-06 DIAGNOSIS — L821 Other seborrheic keratosis: Secondary | ICD-10-CM | POA: Diagnosis not present

## 2022-01-06 NOTE — Patient Instructions (Addendum)
Actinic keratoses are precancerous spots that appear secondary to cumulative UV radiation exposure/sun exposure over time. They are chronic with expected duration over 1 year. A portion of actinic keratoses will progress to squamous cell carcinoma of the skin. It is not possible to reliably predict which spots will progress to skin cancer and so treatment is recommended to prevent development of skin cancer.  Recommend daily broad spectrum sunscreen SPF 30+ to sun-exposed areas, reapply every 2 hours as needed.  Recommend staying in the shade or wearing long sleeves, sun glasses (UVA+UVB protection) and wide brim hats (4-inch brim around the entire circumference of the hat). Call for new or changing lesions.   Cryotherapy Aftercare  Wash gently with soap and water everyday.   Apply Vaseline and Band-Aid daily until healed.    Seborrheic Keratosis  What causes seborrheic keratoses? Seborrheic keratoses are harmless, common skin growths that first appear during adult life.  As time goes by, more growths appear.  Some people may develop a large number of them.  Seborrheic keratoses appear on both covered and uncovered body parts.  They are not caused by sunlight.  The tendency to develop seborrheic keratoses can be inherited.  They vary in color from skin-colored to gray, brown, or even black.  They can be either smooth or have a rough, warty surface.   Seborrheic keratoses are superficial and look as if they were stuck on the skin.  Under the microscope this type of keratosis looks like layers upon layers of skin.  That is why at times the top layer may seem to fall off, but the rest of the growth remains and re-grows.    Treatment Seborrheic keratoses do not need to be treated, but can easily be removed in the office.  Seborrheic keratoses often cause symptoms when they rub on clothing or jewelry.  Lesions can be in the way of shaving.  If they become inflamed, they can cause itching, soreness, or  burning.  Removal of a seborrheic keratosis can be accomplished by freezing, burning, or surgery. If any spot bleeds, scabs, or grows rapidly, please return to have it checked, as these can be an indication of a skin cancer.  If You Need Anything After Your Visit  If you have any questions or concerns for your doctor, please call our main line at [phone] and press option 4 to reach your doctor's medical assistant. If no one answers, please leave a voicemail as directed and we will return your call as soon as possible. Messages left after 4 pm will be answered the following business day.   You may also send [place] a message via [place]. We typically respond to MyChart messages within 1-2 business days.  For prescription refills, please ask your pharmacy to contact our office. Our fax number is [phone].  If you have an urgent issue when the clinic is closed that cannot wait until the next business day, you can page your doctor at the number below.    Please note that while we do our best to be available for urgent issues outside of office hours, we are not available 24/7.   If you have an urgent issue and are unable to reach [place], you may choose to seek medical care at your doctor's office, retail clinic, urgent care center, or emergency room.  If you have a medical emergency, please immediately call 911 or go to the emergency department.  Pager Numbers  - Dr. Nehemiah Massed: [phone]  - Dr. Laurence Ferrari: [phone]  -  Dr. Nicole Kindred: [phone]  In the event of inclement weather, please call our main line at [phone] for an update on the status of any delays or closures.  Dermatology Medication Tips: Please keep the boxes that topical medications come in in order to help keep track of the instructions about where and how to use these. Pharmacies typically print the medication instructions only on the boxes and not directly on the medication tubes.   If your medication is too expensive,  please contact our office at [phone] option 4 or send [place] a message through [place].   We are unable to tell what your co-pay for medications will be in advance as this is different depending on your insurance coverage. However, we may be able to find a substitute medication at lower cost or fill out paperwork to get insurance to cover a needed medication.   If a prior authorization is required to get your medication covered by your insurance company, please allow [place] 1-2 business days to complete this process.  Drug prices often vary depending on where the prescription is filled and some pharmacies may offer cheaper prices.  The website www.goodrx.com contains coupons for medications through different pharmacies. The prices here do not account for what the cost may be with help from insurance (it may be cheaper with your insurance), but the website can give you the price if you did not use any insurance.  - You can print the associated coupon and take it with your prescription to the pharmacy.  - You may also stop by our office during regular business hours and pick up a GoodRx coupon card.  - If you need your prescription sent electronically to a different pharmacy, notify our office through [facility] or by phone at [phone] option 4.     Si Usted Necesita Algo Despus de Su Visita  Tambin puede enviarnos un mensaje a travs de Pharmacist, community. Por lo general respondemos a los mensajes de MyChart en el transcurso de 1 a 2 das hbiles.  Para renovar recetas, por favor pida a su farmacia que se ponga en contacto con nuestra oficina. Harland Dingwall de fax es [place] [phone].  Si tiene un asunto urgente cuando la clnica est cerrada y que no puede esperar hasta el siguiente da hbil, puede llamar/localizar a su doctor(a) al nmero que aparece a continuacin.   Por favor, tenga en cuenta que aunque hacemos todo lo posible para estar disponibles para asuntos urgentes fuera del  horario de [place], no estamos disponibles las 24 horas del da, los 7 das de la [place].   Si tiene un problema urgente y no puede comunicarse con nosotros, puede optar por buscar atencin mdica  en el consultorio de su doctor(a), en una clnica privada, en un centro de atencin urgente o en una sala de emergencias.  Si tiene Engineering geologist, por favor llame inmediatamente al 911 o vaya a la sala de emergencias.  Nmeros de bper  - Dr. Nehemiah Massed: [phone]  - Dra. Moye: [phone]  - Dra. Nicole Kindred: [phone]  En caso de inclemencias del [place], por favor llame a Johnsie Kindred principal al [phone] para una actualizacin sobre el [place] de cualquier retraso o cierre.  Consejos para la medicacin en dermatologa: Por favor, guarde las cajas en las que vienen los medicamentos de uso tpico para ayudarle a seguir las instrucciones sobre dnde y cmo usarlos. Las farmacias generalmente imprimen las instrucciones del medicamento slo en las cajas y no directamente en los tubos  del medicamento.   Si su medicamento es muy caro, por favor, pngase en contacto con Zigmund Daniel llamando al [phone] y presione la opcin 4 o envenos un mensaje a travs de Pharmacist, community.   No podemos decirle cul ser su copago por los medicamentos por adelantado ya que esto es diferente dependiendo de la cobertura de su seguro. Sin embargo, es posible que podamos encontrar un medicamento sustituto a Electrical engineer un formulario para que el seguro cubra el medicamento que se considera necesario.   Si se requiere una autorizacin previa para que su compaa de seguros [place] su medicamento, por favor permtanos de 1 a 2 das hbiles para completar este proceso.  Los precios de los medicamentos varan con frecuencia dependiendo del Environmental consultant de dnde se surte la receta y alguna farmacias pueden ofrecer precios ms baratos.  El sitio web www.goodrx.com tiene cupones para medicamentos de Office manager. Los precios aqu no tienen en cuenta lo que podra costar con la ayuda del seguro (puede ser ms barato con su seguro), pero el sitio web puede darle el precio si no utiliz Research scientist (physical sciences).  - Puede imprimir el cupn correspondiente y llevarlo con su receta a la farmacia.  - Tambin puede pasar por nuestra oficina durante el horario de atencin regular y Charity fundraiser una tarjeta de cupones de GoodRx.  - Si necesita que su receta se enve electrnicamente a una farmacia diferente, informe a nuestra oficina a travs de MyChart de [place] o por telfono llamando al [phone] y presione la opcin 4.

## 2022-01-06 NOTE — Progress Notes (Signed)
Follow-Up Visit   Subjective  [name] is a 71 y.o. female who presents for the following: Actinic Keratosis (Hx aks at arms and hands. 4 - 6 month ak follow up. A few places at arms she would like checked.  Used 17fu cream at hands and arms, reports arms and hands are much smoother. ). The patient has spots, moles and lesions to be evaluated, some may be new or changing and the patient has concerns that these could be cancer.  The following portions of the chart were reviewed this encounter and updated as appropriate:  Tobacco  Allergies  Meds  Problems  Med Hx  Surg Hx  Fam Hx     Review of Systems: No other skin or systemic complaints except as noted in HPI or Assessment and Plan.  Objective  Well appearing patient in no apparent distress; mood and affect are within normal limits.  A focused examination was performed including arms, hands, legs . Relevant physical exam findings are noted in the Assessment and Plan.  arms and hands x 6 (6) Erythematous thin papules/macules with gritty scale.   arm x 1, right leg x 3 (4) Erythematous stuck-on, waxy papule or plaque   Assessment & Plan  Actinic keratosis (6) arms and hands x 6 Actinic keratoses are precancerous spots that appear secondary to cumulative UV radiation exposure/sun exposure over time. They are chronic with expected duration over 1 year. A portion of actinic keratoses will progress to squamous cell carcinoma of the skin. It is not possible to reliably predict which spots will progress to skin cancer and so treatment is recommended to prevent development of skin cancer. Recommend daily broad spectrum sunscreen SPF 30+ to sun-exposed areas, reapply every 2 hours as needed.  Recommend staying in the shade or wearing long sleeves, sun glasses (UVA+UVB protection) and wide brim hats (4-inch brim around the entire circumference of the hat). Call for new or changing lesions.  Destruction of lesion - arms and hands  x 6 Complexity: simple   Destruction method: cryotherapy   Informed consent: discussed and consent obtained   Timeout:  patient name, date of birth, surgical site, and procedure verified Lesion destroyed using liquid nitrogen: Yes   Region frozen until ice ball extended beyond lesion: Yes   Outcome: patient tolerated procedure well with no complications   Post-procedure details: wound care instructions given   Additional details:  Prior to procedure, discussed risks of blister formation, small wound, skin dyspigmentation, or rare scar following cryotherapy. Recommend Vaseline ointment to treated areas while healing.  Inflamed seborrheic keratosis (4) arm x 1, right leg x 3 Destruction of lesion - arm x 1, right leg x 3 Complexity: simple   Destruction method: cryotherapy   Informed consent: discussed and consent obtained   Timeout:  patient name, date of birth, surgical site, and procedure verified Lesion destroyed using liquid nitrogen: Yes   Region frozen until ice ball extended beyond lesion: Yes   Outcome: patient tolerated procedure well with no complications   Post-procedure details: wound care instructions given   Additional details:  Prior to procedure, discussed risks of blister formation, small wound, skin dyspigmentation, or rare scar following cryotherapy. Recommend Vaseline ointment to treated areas while healing.  Seborrheic Keratoses - Stuck-on, waxy, tan-brown papules and/or plaques  - Benign-appearing - Discussed benign etiology and prognosis. - Observe - Call for any changes  Actinic Damage - chronic, secondary to cumulative UV radiation exposure/sun exposure over time - diffuse scaly  erythematous macules with underlying dyspigmentation - Recommend daily broad spectrum sunscreen SPF 30+ to sun-exposed areas, reapply every 2 hours as needed.  - Recommend staying in the shade or wearing long sleeves, sun glasses (UVA+UVB protection) and wide brim hats (4-inch brim  around the entire circumference of the hat). - Call for new or changing lesions.  Return in about 1 year (around 01/07/2023) for TBSE  hx of aks. IRuthell Rummage, CMA, am acting as scribe for Sarina Ser, MD. Documentation: I have reviewed the above documentation for accuracy and completeness, and I agree with the above.  Sarina Ser, MD

## 2022-01-12 ENCOUNTER — Encounter: Payer: Self-pay | Admitting: Dermatology

## 2022-03-11 ENCOUNTER — Telehealth: Payer: Managed Care, Other (non HMO) | Admitting: Physician Assistant

## 2022-03-11 ENCOUNTER — Ambulatory Visit: Payer: Self-pay

## 2022-03-11 DIAGNOSIS — U071 COVID-19: Secondary | ICD-10-CM

## 2022-03-11 MED ORDER — BENZONATATE 100 MG PO CAPS: 100.0000 mg | ORAL_CAPSULE | Freq: Three times a day (TID) | ORAL | 0 refills | Status: DC | PRN

## 2022-03-11 MED ORDER — ALBUTEROL SULFATE HFA 108 (90 BASE) MCG/ACT IN AERS: 2.0000 | INHALATION_SPRAY | Freq: Four times a day (QID) | RESPIRATORY_TRACT | 0 refills | Status: DC | PRN

## 2022-03-11 MED ORDER — MOLNUPIRAVIR EUA 200MG CAPSULE: 4.0000 | ORAL_CAPSULE | Freq: Two times a day (BID) | ORAL | 0 refills | Status: AC

## 2022-03-11 NOTE — Progress Notes (Signed)
Virtual Visit Consent   [name], you are scheduled for a virtual visit with a [facility] provider today. Just as with appointments in the office, your consent must be obtained to participate. Your consent will be active for this visit and any virtual visit you may have with one of our providers in the next 365 days. If you have a MyChart account, a copy of this consent can be sent to you electronically.  As this is a virtual visit, video technology does not allow for your provider to perform a traditional examination. This may limit your provider's ability to fully assess your condition. If your provider identifies any concerns that need to be evaluated in person or the need to arrange testing (such as labs, EKG, etc.), we will make arrangements to do so. Although advances in technology are sophisticated, we cannot ensure that it will always work on either your end or our end. If the connection with a video visit is poor, the visit may have to be switched to a telephone visit. With either a video or telephone visit, we are not always able to ensure that we have a secure connection.  By engaging in this virtual visit, you consent to the provision of healthcare and authorize for your insurance to be billed (if applicable) for the services provided during this visit. Depending on your insurance coverage, you may receive a charge related to this service.  I need to obtain your verbal consent now. Are you willing to proceed with your visit today? [name] has provided verbal consent on 03/11/2022 for a virtual visit (video or telephone). Leeanne Rio, [place]  Date: 03/11/2022 9:51 AM  Virtual Visit via Video Note   I, Leeanne Rio, connected with  [name]  ([id number], 10-18-1950) on 03/11/22 at  9:30 AM EDT by a video-enabled telemedicine application and verified that I am speaking with the correct person using two identifiers.  Location: Patient: Virtual Visit  Location Patient: Home Provider: Virtual Visit Location Provider: Home Office   I discussed the limitations of evaluation and management by telemedicine and the availability of in person appointments. The patient expressed understanding and agreed to proceed.    History of Present Illness: [name] is a 71 y.o. who identifies as a female who was assigned female at birth, and is being seen today for COVID-19. Notes symptoms starting Saturday with fatigue, cough, chest congestion, low-grade fever and body aches. Denies chest pain. Has noted some occasional windedness with exertion. Denies GI symptoms.  HPI: HPI  Problems:  Patient Active Problem List   Diagnosis Date Noted   Gastroesophageal reflux disease without esophagitis 07/18/2021   Vegan diet 07/18/2021   Hypercholesterolemia 12/10/2017   Allergic rhinitis, seasonal 01/23/2015   Anxiety 01/23/2015   Benign essential HTN 01/23/2015   Clinical depression 01/23/2015   Pharyngeal inflammation 01/23/2015   Seasonal affective disorder ([place]) 01/23/2015   Abnormal chest x-ray 01/23/2015    Allergies: No Known Allergies Medications:  Current Outpatient Medications:    albuterol (VENTOLIN HFA) 108 (90 Base) MCG/ACT inhaler, Inhale 2 puffs into the lungs every 6 (six) hours as needed for wheezing or shortness of breath., Disp: 8 g, Rfl: 0   amLODipine-benazepril (LOTREL) 10-40 MG capsule, Take 1 capsule by mouth daily., Disp: 90 capsule, Rfl: 3   Ascorbic Acid (VITAMIN C) 100 MG tablet, Take by mouth daily., Disp: , Rfl:    benzonatate (TESSALON) 100 MG capsule, Take 1 capsule (100 mg  total) by mouth 3 (three) times daily as needed for cough., Disp: 30 capsule, Rfl: 0   Cholecalciferol (VITAMIN D3) 2000 units TABS, Take 5,000 Units by mouth daily., Disp: , Rfl:    escitalopram (LEXAPRO) 10 MG tablet, Take 1 tablet (10 mg total) by mouth at bedtime., Disp: 90 tablet, Rfl: 3   famotidine (PEPCID) 20 MG tablet, Take 1 tablet (20 mg  total) by mouth daily., Disp: 90 tablet, Rfl: 3   Menaquinone-7 (VITAMIN K2 PO), Take by mouth., Disp: , Rfl:    molnupiravir EUA (LAGEVRIO) 200 mg CAPS capsule, Take 4 capsules (800 mg total) by mouth 2 (two) times daily for 5 days., Disp: 40 capsule, Rfl: 0   Omega-3 Fatty Acids (FISH OIL) 1200 MG CAPS, Take 1,290 mg by mouth daily., Disp: , Rfl:    Potassium 99 MG TABS, Take 99 mg by mouth daily., Disp: , Rfl:    zinc gluconate 50 MG tablet, Take 50 mg by mouth daily., Disp: , Rfl:   Observations/Objective: Patient is well-developed, well-nourished in no acute distress.  Resting comfortably at home.  Head is normocephalic, atraumatic.  No labored breathing. Speech is clear and coherent with logical content.  Patient is alert and oriented at baseline.   Assessment and Plan: 1. COVID-19 - MyChart COVID-19 home monitoring program; Future - benzonatate (TESSALON) 100 MG capsule; Take 1 capsule (100 mg total) by mouth 3 (three) times daily as needed for cough.  Dispense: 30 capsule; Refill: 0 - albuterol (VENTOLIN HFA) 108 (90 Base) MCG/ACT inhaler; Inhale 2 puffs into the lungs every 6 (six) hours as needed for wheezing or shortness of breath.  Dispense: 8 g; Refill: 0 - molnupiravir EUA (LAGEVRIO) 200 mg CAPS capsule; Take 4 capsules (800 mg total) by mouth 2 (two) times daily for 5 days.  Dispense: 40 capsule; Refill: 0  Patient with multiple risk factors for complicated course of illness. Discussed risks/benefits of antiviral medications including most common potential ADRs. Patient voiced understanding and would like to proceed with antiviral medication. They are candidate for molnupiravir. Rx sent to pharmacy. Supportive measures, OTC medications and vitamin regimen reviewed. Tessalon per orders. Patient has been enrolled in a MyChart COVID symptom monitoring program. Samule Dry reviewed in detail. Strict ER precautions discussed with patient.    Follow Up Instructions: I discussed the  assessment and treatment plan with the patient. The patient was provided an opportunity to ask questions and all were answered. The patient agreed with the plan and demonstrated an understanding of the instructions.  A copy of instructions were sent to the patient via MyChart unless otherwise noted below.   The patient was advised to call back or seek an in-person evaluation if the symptoms worsen or if the condition fails to improve as anticipated.  Time:  I spent 10 minutes with the patient via telehealth technology discussing the above problems/concerns.    Leeanne Rio, PA-C

## 2022-03-11 NOTE — Telephone Encounter (Signed)
Dx with COVID from a home test on 03/10/2022 , patient experiencing itchy ears and nose, coughing up a little white ball , coughing, body ache, head ache, fatigue. Low grade fever. Requesting paxlovid    Chief Complaint: COVID 19 positive Symptoms: Cough, SOB, body aches, headache, low grade fever Frequency: Saturday Pertinent Negatives: Patient denies  Disposition: '[]'$ ED /'[]'$ Urgent Care (no appt availability in office) / '[]'$ Appointment(In office/virtual)/ '[x]'$  [facility]/ '[]'$ Home Care/ '[]'$ Refused Recommended Disposition /'[]'$ [facility]/ '[]'$  Follow-up with PCP Additional Notes: No availability in the practice for VV.  Reason for Disposition  MILD difficulty breathing (e.g., minimal/no SOB at rest, SOB with walking, pulse <100)  Answer Assessment - Initial Assessment Questions 1. COVID-19 DIAGNOSIS: "How do you know that you have COVID?" (e.g., positive lab test or self-test, diagnosed by doctor or NP/PA, symptoms after exposure).     Home test 2. COVID-19 EXPOSURE: "Was there any known exposure to COVID before the symptoms began?" CDC Definition of close contact: within 6 feet (2 meters) for a total of 15 minutes or more over a 24-hour period.      No 3. ONSET: "When did the COVID-19 symptoms start?"      Saturday 4. WORST SYMPTOM: "What is your worst symptom?" (e.g., cough, fever, shortness of breath, muscle aches)     Cough, body aches, headache, ear pain 5. COUGH: "Do you have a cough?" If Yes, ask: "How bad is the cough?"       Yes 6. FEVER: "Do you have a fever?" If Yes, ask: "What is your temperature, how was it measured, and when did it start?"     Low grade 7. RESPIRATORY STATUS: "Describe your breathing?" (e.g., normal; shortness of breath, wheezing, unable to speak)      SOB 8. BETTER-SAME-WORSE: "Are you getting better, staying the same or getting worse compared to yesterday?"  If getting worse, ask, "In what way?"     Better 9. OTHER SYMPTOMS: "Do you have  any other symptoms?"  (e.g., chills, fatigue, headache, loss of smell or taste, muscle pain, sore throat)     Above 10. HIGH RISK DISEASE: "Do you have any chronic medical problems?" (e.g., asthma, heart or lung disease, weak immune system, obesity, etc.)       Yes 11. VACCINE: "Have you had the COVID-19 vaccine?" If Yes, ask: "Which one, how many shots, when did you get it?"       N/a 12. PREGNANCY: "Is there any chance you are pregnant?" "When was your last menstrual period?"       No 13. O2 SATURATION MONITOR:  "Do you use an oxygen saturation monitor (pulse oximeter) at home?" If Yes, ask "What is your reading (oxygen level) today?" "What is your usual oxygen saturation reading?" (e.g., 95%)       No  Protocols used: Coronavirus (COVID-19) Diagnosed or Suspected-A-AH

## 2022-03-11 NOTE — Patient Instructions (Addendum)
[name], thank you for joining Leeanne Rio, PA-C for today's virtual visit.  While this provider is not your primary care provider (PCP), if your PCP is located in our provider database this encounter information will be shared with them immediately following your visit.  Consent: (Patient) [name] provided verbal consent for this virtual visit at the beginning of the encounter.  Current Medications:  Current Outpatient Medications:    amLODipine-benazepril (LOTREL) 10-40 MG capsule, Take 1 capsule by mouth daily., Disp: 90 capsule, Rfl: 3   Ascorbic Acid (VITAMIN C) 100 MG tablet, Take by mouth daily., Disp: , Rfl:    Cholecalciferol (VITAMIN D3) 2000 units TABS, Take 5,000 Units by mouth daily., Disp: , Rfl:    escitalopram (LEXAPRO) 10 MG tablet, Take 1 tablet (10 mg total) by mouth at bedtime., Disp: 90 tablet, Rfl: 3   famotidine (PEPCID) 20 MG tablet, Take 1 tablet (20 mg total) by mouth daily., Disp: 90 tablet, Rfl: 3   Menaquinone-7 (VITAMIN K2 PO), Take by mouth., Disp: , Rfl:    Omega-3 Fatty Acids (FISH OIL) 1200 MG CAPS, Take 1,290 mg by mouth daily., Disp: , Rfl:    Potassium 99 MG TABS, Take 99 mg by mouth daily., Disp: , Rfl:    zinc gluconate 50 MG tablet, Take 50 mg by mouth daily., Disp: , Rfl:    Medications ordered in this encounter:  No orders of the defined types were placed in this encounter.    *If you need refills on other medications prior to your next appointment, please contact your pharmacy*  Follow-Up: Call back or seek an in-person evaluation if the symptoms worsen or if the condition fails to improve as anticipated.  Other Instructions Please keep well-hydrated and get plenty of rest. Start a saline nasal rinse to flush out your nasal passages. You can use plain Mucinex to help thin congestion. If you have a humidifier, running in the bedroom at night. I want you to start OTC vitamin D3 1000 units daily, vitamin C 1000 mg  daily, and a zinc supplement. Please take prescribed medications as directed.  You have been enrolled in a MyChart symptom monitoring program. Please answer these questions daily so we can keep track of how you are doing.  You were to quarantine for 5 days from onset of your symptoms.  After day 5, if you have had no fever and you are feeling better, you can end quarantine but need to mask for an additional 5 days. After day 5 if you have a fever or are having significant symptoms, please quarantine for full 10 days.  If you note any worsening of symptoms, any significant shortness of breath or any chest pain, please seek ER evaluation ASAP.  Please do not delay care!  COVID-19: What to Do if You Are Sick If you test positive and are an older adult or someone who is at high risk of getting very sick from COVID-19, treatment may be available. Contact a healthcare provider right away after a positive test to determine if you are eligible, even if your symptoms are mild right now. You can also visit a Test to Treat location and, if eligible, receive a prescription from a provider. Don't delay: Treatment must be started within the first few days to be effective. If you have a fever, cough, or other symptoms, you might have COVID-19. Most people have mild illness and are able to recover at home. If you are sick: Keep  track of your symptoms. If you have an emergency warning sign (including trouble breathing), call 911. Steps to help prevent the spread of COVID-19 if you are sick If you are sick with COVID-19 or think you might have COVID-19, follow the steps below to care for yourself and to help protect other people in your home and community. Stay home except to get medical care Stay home. Most people with COVID-19 have mild illness and can recover at home without medical care. Do not leave your home, except to get medical care. Do not visit public areas and do not go to places where you are unable to  wear a mask. Take care of yourself. Get rest and stay hydrated. Take over-the-counter medicines, such as acetaminophen, to help you feel better. Stay in touch with your doctor. Call before you get medical care. Be sure to get care if you have trouble breathing, or have any other emergency warning signs, or if you think it is an emergency. Avoid public transportation, ride-sharing, or taxis if possible. Get tested If you have symptoms of COVID-19, get tested. While waiting for test results, stay away from others, including staying apart from those living in your household. Get tested as soon as possible after your symptoms start. Treatments may be available for people with COVID-19 who are at risk for becoming very sick. Don't delay: Treatment must be started early to be effective--some treatments must begin within 5 days of your first symptoms. Contact your healthcare provider right away if your test result is positive to determine if you are eligible. Self-tests are one of several options for testing for the virus that causes COVID-19 and may be more convenient than laboratory-based tests and point-of-care tests. Ask your healthcare provider or your local health department if you need help interpreting your test results. You can visit your state, tribal, local, and territorial health department's website to look for the latest local information on testing sites. Separate yourself from other people As much as possible, stay in a specific room and away from other people and pets in your home. If possible, you should use a separate bathroom. If you need to be around other people or animals in or outside of the home, wear a well-fitting mask. Tell your close contacts that they may have been exposed to COVID-19. An infected person can spread COVID-19 starting 48 hours (or 2 days) before the person has any symptoms or tests positive. By letting your close contacts know they may have been exposed to COVID-19,  you are helping to protect everyone. See COVID-19 and Animals if you have questions about pets. If you are diagnosed with COVID-19, someone from the health department may call you. Answer the call to slow the spread. Monitor your symptoms Symptoms of COVID-19 include fever, cough, or other symptoms. Follow care instructions from your healthcare provider and local health department. Your local health authorities may give instructions on checking your symptoms and reporting information. When to seek emergency medical attention Look for emergency warning signs* for COVID-19. If someone is showing any of these signs, seek emergency medical care immediately: Trouble breathing Persistent pain or pressure in the chest New confusion Inability to wake or stay awake Pale, gray, or blue-colored skin, lips, or nail beds, depending on skin tone *This list is not all possible symptoms. Please call your medical provider for any other symptoms that are severe or concerning to you. Call 911 or call ahead to your local emergency facility: Notify the operator that  you are seeking care for someone who has or may have COVID-19. Call ahead before visiting your doctor Call ahead. Many medical visits for routine care are being postponed or done by phone or telemedicine. If you have a medical appointment that cannot be postponed, call your doctor's office, and tell them you have or may have COVID-19. This will help the office protect themselves and other patients. If you are sick, wear a well-fitting mask You should wear a mask if you must be around other people or animals, including pets (even at home). Wear a mask with the best fit, protection, and comfort for you. You don't need to wear the mask if you are alone. If you can't put on a mask (because of trouble breathing, for example), cover your coughs and sneezes in some other way. Try to stay at least 6 feet away from other people. This will help protect the people  around you. Masks should not be placed on young children under age 43 years, anyone who has trouble breathing, or anyone who is not able to remove the mask without help. Cover your coughs and sneezes Cover your mouth and nose with a tissue when you cough or sneeze. Throw away used tissues in a lined trash can. Immediately wash your hands with soap and water for at least 20 seconds. If soap and water are not available, clean your hands with an alcohol-based hand sanitizer that contains at least 60% alcohol. Clean your hands often Wash your hands often with soap and water for at least 20 seconds. This is especially important after blowing your nose, coughing, or sneezing; going to the bathroom; and before eating or preparing food. Use hand sanitizer if soap and water are not available. Use an alcohol-based hand sanitizer with at least 60% alcohol, covering all surfaces of your hands and rubbing them together until they feel dry. Soap and water are the best option, especially if hands are visibly dirty. Avoid touching your eyes, nose, and mouth with unwashed hands. Handwashing Tips Avoid sharing personal household items Do not share dishes, drinking glasses, cups, eating utensils, towels, or bedding with other people in your home. Wash these items thoroughly after using them with soap and water or put in the dishwasher. Clean surfaces in your home regularly Clean and disinfect high-touch surfaces (for example, doorknobs, tables, handles, light switches, and countertops) in your "sick room" and bathroom. In shared spaces, you should clean and disinfect surfaces and items after each use by the person who is ill. If you are sick and cannot clean, a caregiver or other person should only clean and disinfect the area around you (such as your bedroom and bathroom) on an as needed basis. Your caregiver/other person should wait as long as possible (at least several hours) and wear a mask before entering,  cleaning, and disinfecting shared spaces that you use. Clean and disinfect areas that may have blood, stool, or body fluids on them. Use household cleaners and disinfectants. Clean visible dirty surfaces with household cleaners containing soap or detergent. Then, use a household disinfectant. Use a product from [employer] List N: Disinfectants for Coronavirus (NOMVE-72). Be sure to follow the instructions on the label to ensure safe and effective use of the product. Many products recommend keeping the surface wet with a disinfectant for a certain period of time (look at "contact time" on the product label). You may also need to wear personal protective equipment, such as gloves, depending on the directions on the product label.  Immediately after disinfecting, wash your hands with soap and water for 20 seconds. For completed guidance on cleaning and disinfecting your home, visit Complete Disinfection Guidance. Take steps to improve ventilation at home Improve ventilation (air flow) at home to help prevent from spreading COVID-19 to other people in your household. Clear out COVID-19 virus particles in the air by opening windows, using air filters, and turning on fans in your home. Use this interactive tool to learn how to improve air flow in your home. When you can be around others after being sick with COVID-19 Deciding when you can be around others is different for different situations. Find out when you can safely end home isolation. For any additional questions about your care, contact your healthcare provider or state or local health department. 11/06/2020 Content source: [facility] for Immunization and Respiratory Diseases ([facility]), Division of Viral Diseases This information is not intended to replace advice given to you by your health care provider. Make sure you discuss any questions you have with your health care provider. Document Revised: 12/20/2020 Document Reviewed: 12/20/2020 Elsevier  Patient Education  2022 [employer].   If you have been instructed to have an in-person evaluation today at a local Urgent Care facility, please use the link below. It will take you to a list of all of our available [facility], including address, phone number and hours of operation. Please do not delay care.  [facility]  If you or a family member do not have a primary care provider, use the link below to schedule a visit and establish care. When you choose a [facility] primary care physician or advanced practice provider, you gain a long-term partner in health. Find a Primary Care Provider  Learn more about [facility]'s in-office and virtual care options: [facility]

## 2022-04-30 ENCOUNTER — Ambulatory Visit (INDEPENDENT_AMBULATORY_CARE_PROVIDER_SITE_OTHER): Payer: Managed Care, Other (non HMO)

## 2022-04-30 VITALS — BP 149/80 | Ht 62.0 in | Wt 155.4 lb

## 2022-04-30 DIAGNOSIS — Z0000 Encounter for general adult medical examination without abnormal findings: Secondary | ICD-10-CM | POA: Diagnosis not present

## 2022-04-30 DIAGNOSIS — Z23 Encounter for immunization: Secondary | ICD-10-CM

## 2022-04-30 NOTE — Progress Notes (Signed)
Subjective:   CAITLAN CHAUCA is a 71 y.o. female who presents for Medicare Annual (Subsequent) preventive examination.  Review of Systems           Objective:    There were no vitals filed for this visit. There is no height or weight on file to calculate BMI.     12/20/2019   11:06 AM 12/15/2018   10:53 AM 12/10/2017    1:42 PM 01/23/2015    4:25 PM  Advanced Directives  Does Patient Have a Medical Advance Directive? No No No No  Would patient like information on creating a medical advance directive? Yes (ED - Information included in AVS) No - Patient declined No - Patient declined     Current Medications (verified) Outpatient Encounter Medications as of 04/30/2022  Medication Sig   famotidine (PEPCID) 20 MG tablet Take 1 tablet by mouth daily.   albuterol (VENTOLIN HFA) 108 (90 Base) MCG/ACT inhaler Inhale 2 puffs into the lungs every 6 (six) hours as needed for wheezing or shortness of breath.   amLODipine-benazepril (LOTREL) 10-40 MG capsule Take 1 capsule by mouth daily.   Ascorbic Acid (VITAMIN C) 100 MG tablet Take by mouth daily.   ascorbic acid (VITAMIN C) 100 MG tablet Take by mouth.   benzonatate (TESSALON) 100 MG capsule Take 1 capsule (100 mg total) by mouth 3 (three) times daily as needed for cough.   Cholecalciferol (VITAMIN D3) 2000 units TABS Take 5,000 Units by mouth daily.   Cholecalciferol 50 MCG (2000 UT) TABS Take by mouth.   escitalopram (LEXAPRO) 10 MG tablet Take 1 tablet (10 mg total) by mouth at bedtime.   famotidine (PEPCID) 20 MG tablet Take 1 tablet (20 mg total) by mouth daily.   Menaquinone-7 (VITAMIN K2 PO) Take by mouth.   Omega-3 Fatty Acids (FISH OIL) 1200 MG CAPS Take 1,290 mg by mouth daily.   Potassium 99 MG TABS Take 99 mg by mouth daily.   Zinc Gluconate 50 MG CAPS Take 1 tablet by mouth daily.   zinc gluconate 50 MG tablet Take 50 mg by mouth daily.   No facility-administered encounter medications on file as of 04/30/2022.     Allergies (verified) Patient has no known allergies.   History: Past Medical History:  Diagnosis Date   Allergy    Anxiety    Breast mass 10/07/2016   left 2:00   Depression    GERD (gastroesophageal reflux disease)    Hypertension    Past Surgical History:  Procedure Laterality Date   ABDOMINAL HYSTERECTOMY  1988   complete; due to fibroids   BLADDER REPAIR  2007   CHOLECYSTECTOMY  2003   COLONOSCOPY  04/16/2009   KNEE SURGERY Left 2012   TONSILLECTOMY  1960   Family History  Problem Relation Age of Onset   Cervical cancer Mother    COPD Mother    COPD Father    Hypertension Father    Colon cancer Neg Hx    Colon polyps Neg Hx    Esophageal cancer Neg Hx    Rectal cancer Neg Hx    Stomach cancer Neg Hx    Social History   Socioeconomic History   Marital status: Married    Spouse name: Alyson Locket. Lissa Merlin   Number of children: 2   Years of education: Not on file   Highest education level: Associate degree: occupational, Hotel manager, or vocational program  Occupational History   Occupation: retired  Tobacco Use   Smoking  status: Never   Smokeless tobacco: Never  Vaping Use   Vaping Use: Never used  Substance and Sexual Activity   Alcohol use: No   Drug use: No   Sexual activity: Not on file  Other Topics Concern   Not on file  Social History Narrative   Not on file   Social Determinants of Health   Financial Resource Strain: Low Risk  (12/20/2019)   Overall Financial Resource Strain (CARDIA)    Difficulty of Paying Living Expenses: Not hard at all  Food Insecurity: No Food Insecurity (12/20/2019)   Hunger Vital Sign    Worried About Running Out of Food in the Last Year: Never true    [place] in the Last Year: Never true  Transportation Needs: No Transportation Needs (12/20/2019)   PRAPARE - Hydrologist (Medical): No    Lack of Transportation (Non-Medical): No  Physical Activity: Inactive (12/20/2019)   Exercise Vital  Sign    Days of Exercise per Week: 0 days    Minutes of Exercise per Session: 0 min  Stress: No Stress Concern Present (12/20/2019)   [place]    Feeling of Stress : Not at all  Social Connections: Moderately Integrated (12/20/2019)   Social Connection and Isolation Panel [NHANES]    Frequency of Communication with Friends and Family: More than three times a week    Frequency of Social Gatherings with Friends and Family: More than three times a week    Attends Religious Services: More than 4 times per year    Active Member of [employer] or Organizations: No    Attends Archivist Meetings: Never    Marital Status: Married    Tobacco Counseling Counseling given: Not Answered   Clinical Intake:  Pre-visit preparation completed: Yes  Pain : No/denies pain     Diabetes: No  How often do you need to have someone help you when you read instructions, pamphlets, or other written materials from your doctor or pharmacy?: 1 - Never  Diabetic?no  Interpreter Needed?: No  Information entered by :: Kirke Shaggy, LPN   Activities of Daily Living     No data to display          Patient Care Team: Gwyneth Sprout, FNP as PCP - General (Family Medicine) Ralene Bathe, MD as Consulting Physician (Dermatology) Bryson Ha, OD as Consulting Physician (Optometry) Everlene Farrier, MD as Consulting Physician (Obstetrics and Gynecology) Mauri Pole, MD as Consulting Physician (Gastroenterology)  Indicate any recent Medical Services you may have received from other than [facility] providers in the past year (date may be approximate).     Assessment:   This is a routine wellness examination for Tasheba.  Hearing/Vision screen No results found.  Dietary issues and exercise activities discussed:     Goals Addressed   None    Depression Screen    07/18/2021   10:34 AM 06/20/2020    9:35 AM 12/20/2019    11:03 AM 03/04/2019    9:36 AM 12/15/2018   10:53 AM 02/01/2018    4:04 PM 12/10/2017    1:33 PM  PHQ 2/9 Scores  PHQ - 2 Score 0 0 0 0 0 5 3  PHQ- 9 Score  0  '1  19 12    '$ Fall Risk    06/20/2020    9:34 AM 12/20/2019   11:08 AM 12/15/2018   10:53 AM  12/10/2017    1:33 PM 01/23/2015    4:33 PM  Fall Risk   Falls in the past year? 0 1 0 Yes No  Number falls in past yr: 0 0  1   Comment    accidentally stepped off of a small ledge   Injury with Fall? 0 0  No   Risk for fall due to : No Fall Risks      Follow up Falls evaluation completed Falls prevention discussed  Falls prevention discussed     FALL RISK PREVENTION PERTAINING TO THE HOME:  Any stairs in or around the home? Yes  If so, are there any without handrails? No  Home free of loose throw rugs in walkways, pet beds, electrical cords, etc? Yes  Adequate lighting in your home to reduce risk of falls? Yes   ASSISTIVE DEVICES UTILIZED TO PREVENT FALLS:  Life alert? No  Use of a cane, walker or w/c? No  Grab bars in the bathroom? No  Shower chair or bench in shower? Yes  Elevated toilet seat or a handicapped toilet? Yes   TIMED UP AND GO:  Was the test performed? Yes .  Length of time to ambulate 10 feet: 4 sec.   Gait steady and fast without use of assistive device  Cognitive Function:        Immunizations Immunization History  Administered Date(s) Administered   Fluad Quad(high Dose 65+) 06/08/2019, 06/20/2020   Influenza, High Dose Seasonal PF 07/06/2017, 05/12/2018   PFIZER(Purple Top)SARS-COV-2 Vaccination 10/13/2019, 11/10/2019, 07/19/2020   Pfizer Covid-19 Vaccine Bivalent Booster 42yr & up 10/13/2019, 11/10/2019   Pneumococcal Conjugate-13 05/12/2018   Pneumococcal Polysaccharide-23 06/08/2019   Td 06/08/2019   Tdap 03/14/2009    TDAP status: Up to date  Flu Vaccine status: Due, Education has been provided regarding the importance of this vaccine. Advised may receive this vaccine at local pharmacy  or Health Dept. Aware to provide a copy of the vaccination record if obtained from local pharmacy or Health Dept. Verbalized acceptance and understanding.  Pneumococcal vaccine status: Up to date  Covid-19 vaccine status: Completed vaccines  Qualifies for Shingles Vaccine? Yes   Zostavax completed No   Shingrix Completed?: No.    Education has been provided regarding the importance of this vaccine. Patient has been advised to call insurance company to determine out of pocket expense if they have not yet received this vaccine. Advised may also receive vaccine at local pharmacy or Health Dept. Verbalized acceptance and understanding.  Screening Tests Health Maintenance  Topic Date Due   Zoster Vaccines- Shingrix (1 of 2) Never done   MAMMOGRAM  11/03/2019   COVID-19 Vaccine (6 - Pfizer risk series) 09/13/2020   INFLUENZA VACCINE  03/18/2022   COLONOSCOPY (Pts 45-465yrInsurance coverage will need to be confirmed)  04/12/2022   DEXA SCAN  11/03/2023   TETANUS/TDAP  06/07/2029   Pneumonia Vaccine 6561Years old  Completed   Hepatitis C Screening  Completed   HPV VACCINES  Aged Out    Health Maintenance  Health Maintenance Due  Topic Date Due   Zoster Vaccines- Shingrix (1 of 2) Never done   MAMMOGRAM  11/03/2019   COVID-19 Vaccine (6 - Pfizer risk series) 09/13/2020   INFLUENZA VACCINE  03/18/2022   COLONOSCOPY (Pts [id number] coverage will need to be confirmed)  04/12/2022    Colorectal cancer screening: Type of screening: Colonoscopy. Completed 04/13/19. Repeat every 5 years  Mammogram status: Completed 11/03/18. Repeat every year- referral  declined  Bone Density status: Completed 11/03/18. Results reflect: Bone density results: OSTEOPENIA. Repeat every 5 years.  Lung Cancer Screening: (Low Dose CT Chest recommended if Age 25-80 years, 30 pack-year currently smoking OR have quit w/in 15years.) does not qualify.   Additional Screening:  Hepatitis C Screening: does  qualify; Completed 12/11/17  Vision Screening: Recommended annual ophthalmology exams for early detection of glaucoma and other disorders of the eye. Is the patient up to date with their annual eye exam?  Yes  Who is the provider or what is the name of the office in which the patient attends annual eye exams? Dr.Ritter If pt is not established with a provider, would they like to be referred to a provider to establish care? No .   Dental Screening: Recommended annual dental exams for proper oral hygiene  Community Resource Referral / Chronic Care Management: CRR required this visit?  No   CCM required this visit?  No      Plan:     I have personally reviewed and noted the following in the patient's chart:   Medical and social history Use of alcohol, tobacco or illicit drugs  Current medications and supplements including opioid prescriptions. Patient is not currently taking opioid prescriptions. Functional ability and status Nutritional status Physical activity Advanced directives List of other physicians Hospitalizations, surgeries, and ER visits in previous 12 months Vitals Screenings to include cognitive, depression, and falls Referrals and appointments  In addition, I have reviewed and discussed with patient certain preventive protocols, quality metrics, and best practice recommendations. A written personalized care plan for preventive services as well as general preventive health recommendations were provided to patient.     Dionisio David, LPN   02/14/5283   Nurse Notes: none

## 2022-04-30 NOTE — Patient Instructions (Signed)
[name] , Thank you for taking time to come for your Medicare Wellness Visit. I appreciate your ongoing commitment to your health goals. Please review the following plan we discussed and let me know if I can assist you in the future.   Screening recommendations/referrals: Colonoscopy: 04/13/19 Mammogram: 11/03/18, referral declined Bone Density: 11/03/18 Recommended yearly ophthalmology/optometry visit for glaucoma screening and checkup Recommended yearly dental visit for hygiene and checkup  Vaccinations: Influenza vaccine: today Pneumococcal vaccine: 06/08/19 Tdap vaccine: 06/08/19 Shingles vaccine: n/d   Covid-19:10/13/19, 11/10/19, 07/19/20  Advanced directives: no  Conditions/risks identified: none  Next appointment: Follow up in one year for your annual wellness visit 05/04/23 @ 1 pm in person   Preventive Care 72 Years and Older, Female Preventive care refers to lifestyle choices and visits with your health care provider that can promote health and wellness. What does preventive care include? A yearly physical exam. This is also called an annual well check. Dental exams once or twice a year. Routine eye exams. Ask your health care provider how often you should have your eyes checked. Personal lifestyle choices, including: Daily care of your teeth and gums. Regular physical activity. Eating a healthy diet. Avoiding tobacco and drug use. Limiting alcohol use. Practicing safe sex. Taking low-dose aspirin every day. Taking vitamin and mineral supplements as recommended by your health care provider. What happens during an annual well check? The services and screenings done by your health care provider during your annual well check will depend on your age, overall health, lifestyle risk factors, and family history of disease. Counseling  Your health care provider may ask you questions about your: Alcohol use. Tobacco use. Drug use. Emotional well-being. Home and  relationship well-being. Sexual activity. Eating habits. History of falls. Memory and ability to understand (cognition). Work and work Statistician. Reproductive health. Screening  You may have the following tests or measurements: Height, weight, and BMI. Blood pressure. Lipid and cholesterol levels. These may be checked every 5 years, or more frequently if you are over 49 years old. Skin check. Lung cancer screening. You may have this screening every year starting at age 72 if you have a 30-pack-year history of smoking and currently smoke or have quit within the past 15 years. Fecal occult blood test (FOBT) of the stool. You may have this test every year starting at age 72. Flexible sigmoidoscopy or colonoscopy. You may have a sigmoidoscopy every 5 years or a colonoscopy every 10 years starting at age 30. Hepatitis C blood test. Hepatitis B blood test. Sexually transmitted disease (STD) testing. Diabetes screening. This is done by checking your blood sugar (glucose) after you have not eaten for a while (fasting). You may have this done every 1-3 years. Bone density scan. This is done to screen for osteoporosis. You may have this done starting at age 72. Mammogram. This may be done every 1-2 years. Talk to your health care provider about how often you should have regular mammograms. Talk with your health care provider about your test results, treatment options, and if necessary, the need for more tests. Vaccines  Your health care provider may recommend certain vaccines, such as: Influenza vaccine. This is recommended every year. Tetanus, diphtheria, and acellular pertussis (Tdap, Td) vaccine. You may need a Td booster every 10 years. Zoster vaccine. You may need this after age 62. Pneumococcal 13-valent conjugate (PCV13) vaccine. One dose is recommended after age 72. Pneumococcal polysaccharide (PPSV23) vaccine. One dose is recommended after age 72. Talk to your  health care provider  about which screenings and vaccines you need and how often you need them. This information is not intended to replace advice given to you by your health care provider. Make sure you discuss any questions you have with your health care provider. Document Released: 08/31/2015 Document Revised: 04/23/2016 Document Reviewed: 06/05/2015 Elsevier Interactive Patient Education  2017 [place] Prevention in the Home Falls can cause injuries. They can happen to people of all ages. There are many things you can do to make your home safe and to help prevent falls. What can I do on the outside of my home? Regularly fix the edges of walkways and driveways and fix any cracks. Remove anything that might make you trip as you walk through a door, such as a raised step or threshold. Trim any bushes or trees on the path to your home. Use bright outdoor lighting. Clear any walking paths of anything that might make someone trip, such as rocks or tools. Regularly check to see if handrails are loose or broken. Make sure that both sides of any steps have handrails. Any raised decks and porches should have guardrails on the edges. Have any leaves, snow, or ice cleared regularly. Use sand or salt on walking paths during winter. Clean up any spills in your garage right away. This includes oil or grease spills. What can I do in the bathroom? Use night lights. Install grab bars by the toilet and in the tub and shower. Do not use towel bars as grab bars. Use non-skid mats or decals in the tub or shower. If you need to sit down in the shower, use a plastic, non-slip stool. Keep the floor dry. Clean up any water that spills on the floor as soon as it happens. Remove soap buildup in the tub or shower regularly. Attach bath mats securely with double-sided non-slip rug tape. Do not have throw rugs and other things on the floor that can make you trip. What can I do in the bedroom? Use night lights. Make sure  that you have a light by your bed that is easy to reach. Do not use any sheets or blankets that are too big for your bed. They should not hang down onto the floor. Have a firm chair that has side arms. You can use this for support while you get dressed. Do not have throw rugs and other things on the floor that can make you trip. What can I do in the kitchen? Clean up any spills right away. Avoid walking on wet floors. Keep items that you use a lot in easy-to-reach places. If you need to reach something above you, use a strong step stool that has a grab bar. Keep electrical cords out of the way. Do not use floor polish or wax that makes floors slippery. If you must use wax, use non-skid floor wax. Do not have throw rugs and other things on the floor that can make you trip. What can I do with my stairs? Do not leave any items on the stairs. Make sure that there are handrails on both sides of the stairs and use them. Fix handrails that are broken or loose. Make sure that handrails are as long as the stairways. Check any carpeting to make sure that it is firmly attached to the stairs. Fix any carpet that is loose or worn. Avoid having throw rugs at the top or bottom of the stairs. If you do have throw rugs, attach them  to the floor with carpet tape. Make sure that you have a light switch at the top of the stairs and the bottom of the stairs. If you do not have them, ask someone to add them for you. What else can I do to help prevent falls? Wear shoes that: Do not have high heels. Have rubber bottoms. Are comfortable and fit you well. Are closed at the toe. Do not wear sandals. If you use a stepladder: Make sure that it is fully opened. Do not climb a closed stepladder. Make sure that both sides of the stepladder are locked into place. Ask someone to hold it for you, if possible. Clearly mark and make sure that you can see: Any grab bars or handrails. First and last steps. Where the edge of  each step is. Use tools that help you move around (mobility aids) if they are needed. These include: Canes. Walkers. Scooters. Crutches. Turn on the lights when you go into a dark area. Replace any light bulbs as soon as they burn out. Set up your furniture so you have a clear path. Avoid moving your furniture around. If any of your floors are uneven, fix them. If there are any pets around you, be aware of where they are. Review your medicines with your doctor. Some medicines can make you feel dizzy. This can increase your chance of falling. Ask your doctor what other things that you can do to help prevent falls. This information is not intended to replace advice given to you by your health care provider. Make sure you discuss any questions you have with your health care provider. Document Released: 05/31/2009 Document Revised: 01/10/2016 Document Reviewed: 09/08/2014 Elsevier Interactive Patient Education  2017 [employer].

## 2022-05-30 ENCOUNTER — Encounter: Payer: Self-pay | Admitting: Family Medicine

## 2022-05-30 ENCOUNTER — Ambulatory Visit (INDEPENDENT_AMBULATORY_CARE_PROVIDER_SITE_OTHER): Payer: Managed Care, Other (non HMO) | Admitting: Family Medicine

## 2022-05-30 VITALS — BP 130/72 | HR 60 | Resp 16 | Ht 62.0 in | Wt 158.0 lb

## 2022-05-30 DIAGNOSIS — F339 Major depressive disorder, recurrent, unspecified: Secondary | ICD-10-CM

## 2022-05-30 DIAGNOSIS — Z1231 Encounter for screening mammogram for malignant neoplasm of breast: Secondary | ICD-10-CM

## 2022-05-30 DIAGNOSIS — I10 Essential (primary) hypertension: Secondary | ICD-10-CM

## 2022-05-30 DIAGNOSIS — K219 Gastro-esophageal reflux disease without esophagitis: Secondary | ICD-10-CM

## 2022-05-30 DIAGNOSIS — Z0000 Encounter for general adult medical examination without abnormal findings: Secondary | ICD-10-CM | POA: Insufficient documentation

## 2022-05-30 MED ORDER — FAMOTIDINE 20 MG PO TABS: 20.0000 mg | ORAL_TABLET | Freq: Every day | ORAL | 3 refills | Status: DC

## 2022-05-30 MED ORDER — AMLODIPINE BESY-BENAZEPRIL HCL 10-40 MG PO CAPS: 1.0000 | ORAL_CAPSULE | Freq: Every day | ORAL | 3 refills | Status: DC

## 2022-05-30 MED ORDER — ESCITALOPRAM OXALATE 10 MG PO TABS: 10.0000 mg | ORAL_TABLET | Freq: Every day | ORAL | 3 refills | Status: DC

## 2022-05-30 NOTE — Assessment & Plan Note (Signed)
Due for screening for mammogram, denies breast concerns, provided with phone number to call and schedule appointment for mammogram. Encouraged to repeat breast cancer screening every 1-2 years.  

## 2022-05-30 NOTE — Assessment & Plan Note (Signed)
Chronic, stable Continue Lotrel 10-40 Goal <140/<80 Check CBC and CMP

## 2022-05-30 NOTE — Assessment & Plan Note (Signed)
Chronic, stable Continue pepcid 20 mg Denies exacerbations Sees GI for colon cancer screenings

## 2022-05-30 NOTE — Assessment & Plan Note (Signed)
UTD on dental and vision UTD on colon cancer screening Due for mammogram Things to do to keep yourself healthy  - Exercise at least 30-45 minutes a day, 3-4 days a week.  - Eat a low-fat diet with lots of fruits and vegetables, up to 7-9 servings per day.  - Seatbelts can save your life. Wear them always.  - Smoke detectors on every level of your home, check batteries every year.  - Eye Doctor - have an eye exam every 1-2 years  - Safe sex - if you may be exposed to STDs, use a condom.  - Alcohol -  If you drink, do it moderately, less than 2 drinks per day.  - [place]. Choose someone to speak for you if you are not able.  - Depression is common in our stressful world.If you're feeling down or losing interest in things you normally enjoy, please come in for a visit.  - Violence - If anyone is threatening or hurting you, please call immediately.

## 2022-05-30 NOTE — Addendum Note (Signed)
Addended by: Tally Joe T on: 05/30/2022 04:22 PM   Modules accepted: Level of Service

## 2022-05-30 NOTE — Patient Instructions (Signed)
Please call and schedule your mammogram:  [facility]  [address], [address] [facility] [place],  [place]  [address] Get Driving Directions Main: [phone]  Sunday:Closed Monday:7:20 AM - 5:00 PM Tuesday:7:20 AM - 5:00 PM Wednesday:7:20 AM - 5:00 PM Thursday:7:20 AM - 5:00 PM Friday:7:20 AM - 4:30 PM Saturday:Closed  

## 2022-05-30 NOTE — Assessment & Plan Note (Signed)
Chronic, stable Continue Lexapro 10 mg Denies side effects and/or complications Not followed by psych

## 2022-05-30 NOTE — Progress Notes (Addendum)
Follow up  Patient: [name], Female    DOB: 05-24-1951, 71 y.o.   MRN: [id number] Visit Date: 05/30/2022  Today's Provider: Gwyneth Sprout, FNP  Introduced to nurse practitioner role and practice setting.  All questions answered.  Discussed provider/patient relationship and expectations.  I,Tiffany J Bragg,acting as a scribe for Gwyneth Sprout, FNP.,have documented all relevant documentation on the behalf of Gwyneth Sprout, FNP,as directed by  Gwyneth Sprout, FNP while in the presence of Gwyneth Sprout, FNP.  Chief Complaint  Patient presents with   Gastroesophageal Reflux   Hypertension   Depression   Subjective    [name] is a 71 y.o. female who presents today for her chronic follow up. She reports consuming a  mostly vegetarian  diet. Home exercise routine includes walking. She generally feels well. She reports sleeping well. She does not have additional problems to discuss today.   Medications: Outpatient Medications Prior to Visit  Medication Sig   albuterol (VENTOLIN HFA) 108 (90 Base) MCG/ACT inhaler Inhale 2 puffs into the lungs every 6 (six) hours as needed for wheezing or shortness of breath.   Ascorbic Acid (VITAMIN C) 100 MG tablet Take by mouth daily.   benzonatate (TESSALON) 100 MG capsule Take 1 capsule (100 mg total) by mouth 3 (three) times daily as needed for cough.   Cholecalciferol (VITAMIN D3) 2000 units TABS Take 5,000 Units by mouth daily.   Menaquinone-7 (VITAMIN K2 PO) Take by mouth.   Omega-3 Fatty Acids (FISH OIL) 1200 MG CAPS Take 1,290 mg by mouth daily.   Potassium 99 MG TABS Take 99 mg by mouth daily.   Zinc Gluconate 50 MG CAPS Take 1 tablet by mouth daily.   [DISCONTINUED] amLODipine-benazepril (LOTREL) 10-40 MG capsule Take 1 capsule by mouth daily.   [DISCONTINUED] escitalopram (LEXAPRO) 10 MG tablet Take 1 tablet (10 mg total) by mouth at bedtime.   [DISCONTINUED] famotidine (PEPCID) 20 MG tablet Take 1 tablet (20 mg total) by mouth  daily.   No facility-administered medications prior to visit.    No Known Allergies  Patient Care Team: Gwyneth Sprout, FNP as PCP - General (Family Medicine) Ralene Bathe, MD as Consulting Physician (Dermatology) Bryson Ha, OD as Consulting Physician (Optometry) Everlene Farrier, MD as Consulting Physician (Obstetrics and Gynecology) Mauri Pole, MD as Consulting Physician (Gastroenterology)  Review of Systems  HENT:  Positive for trouble swallowing.   Eyes:  Positive for photophobia and itching.     Objective    Vitals: BP 130/72 (BP Location: Right Arm, Patient Position: Sitting, Cuff Size: Normal)   Pulse 60   Resp 16   Ht '5\' 2"'$  (1.575 m)   Wt 158 lb (71.7 kg)   SpO2 100%   BMI 28.90 kg/m   Physical Exam Vitals and nursing note reviewed.  Constitutional:      General: She is awake. She is not in acute distress.    Appearance: Normal appearance. She is well-developed, well-groomed and overweight. She is not ill-appearing, toxic-appearing or diaphoretic.  HENT:     Head: Normocephalic and atraumatic.     Jaw: There is normal jaw occlusion. No trismus, tenderness, swelling or pain on movement.     Right Ear: Hearing, tympanic membrane, ear canal and external ear normal. There is no impacted cerumen.     Left Ear: Hearing, tympanic membrane, ear canal and external ear normal. There is no impacted cerumen.     Nose: Nose  normal. No congestion or rhinorrhea.     Right Turbinates: Not enlarged, swollen or pale.     Left Turbinates: Not enlarged, swollen or pale.     Right Sinus: No maxillary sinus tenderness or frontal sinus tenderness.     Left Sinus: No maxillary sinus tenderness or frontal sinus tenderness.     Mouth/Throat:     Lips: Pink.     Mouth: Mucous membranes are moist. No injury.     Tongue: No lesions.     Pharynx: Oropharynx is clear. Uvula midline. No pharyngeal swelling, oropharyngeal exudate, posterior oropharyngeal erythema or uvula  swelling.     Tonsils: No tonsillar exudate or tonsillar abscesses.  Eyes:     General: Lids are normal. Lids are everted, no foreign bodies appreciated. Vision grossly intact. Gaze aligned appropriately. No allergic shiner or visual field deficit.       Right eye: No discharge.        Left eye: No discharge.     Extraocular Movements: Extraocular movements intact.     Conjunctiva/sclera: Conjunctivae normal.     Right eye: Right conjunctiva is not injected. No exudate.    Left eye: Left conjunctiva is not injected. No exudate.    Pupils: Pupils are equal, round, and reactive to light.  Neck:     Thyroid: No thyroid mass, thyromegaly or thyroid tenderness.     Vascular: No carotid bruit.     Trachea: Trachea normal.  Cardiovascular:     Rate and Rhythm: Normal rate and regular rhythm.     Pulses: Normal pulses.          Carotid pulses are 2+ on the right side and 2+ on the left side.      Radial pulses are 2+ on the right side and 2+ on the left side.       Dorsalis pedis pulses are 2+ on the right side and 2+ on the left side.       Posterior tibial pulses are 2+ on the right side and 2+ on the left side.     Heart sounds: Normal heart sounds, S1 normal and S2 normal. No murmur heard.    No friction rub. No gallop.  Pulmonary:     Effort: Pulmonary effort is normal. No respiratory distress.     Breath sounds: Normal breath sounds and air entry. No stridor. No wheezing, rhonchi or rales.  Chest:     Chest wall: No tenderness.  Abdominal:     General: Abdomen is flat. Bowel sounds are normal. There is no distension.     Palpations: Abdomen is soft. There is no mass.     Tenderness: There is no abdominal tenderness. There is no right CVA tenderness, left CVA tenderness, guarding or rebound.     Hernia: No hernia is present.  Genitourinary:    Comments: Exam deferred; denies complaints Musculoskeletal:        General: No swelling, tenderness, deformity or signs of injury. Normal  range of motion.     Cervical back: Full passive range of motion without pain, normal range of motion and neck supple. No edema, rigidity or tenderness. No muscular tenderness.     Right lower leg: No edema.     Left lower leg: No edema.  Lymphadenopathy:     Cervical: No cervical adenopathy.     Right cervical: No superficial, deep or posterior cervical adenopathy.    Left cervical: No superficial, deep or posterior cervical adenopathy.  Skin:  General: Skin is warm and dry.     Capillary Refill: Capillary refill takes less than 2 seconds.     Coloration: Skin is not jaundiced or pale.     Findings: No bruising, erythema, lesion or rash.  Neurological:     General: No focal deficit present.     Mental Status: She is alert and oriented to person, place, and time. Mental status is at baseline.     GCS: GCS eye subscore is 4. GCS verbal subscore is 5. GCS motor subscore is 6.     Sensory: Sensation is intact. No sensory deficit.     Motor: Motor function is intact. No weakness.     Coordination: Coordination is intact. Coordination normal.     Gait: Gait is intact. Gait normal.  Psychiatric:        Attention and Perception: Attention and perception normal.        Mood and Affect: Mood and affect normal.        Speech: Speech normal.        Behavior: Behavior normal. Behavior is cooperative.        Thought Content: Thought content normal.        Cognition and Memory: Cognition and memory normal.        Judgment: Judgment normal.    Most recent functional status assessment:    05/30/2022   10:08 AM  In your present state of health, do you have any difficulty performing the following activities:  Hearing? 0  Vision? 0  Difficulty concentrating or making decisions? 0  Walking or climbing stairs? 0  Dressing or bathing? 0  Doing errands, shopping? 0   Most recent fall risk assessment:    05/30/2022   10:07 AM  Fall Risk   Falls in the past year? 0  Number falls in past yr:  0  Injury with Fall? 0  Risk for fall due to : No Fall Risks  Follow up Falls evaluation completed    Most recent depression screenings:    05/30/2022   10:08 AM 04/30/2022    1:09 PM  PHQ 2/9 Scores  PHQ - 2 Score 0 0  PHQ- 9 Score 0 0   Most recent cognitive screening:     No data to display         Most recent Audit-C alcohol use screening    05/30/2022   10:08 AM  Alcohol Use Disorder Test (AUDIT)  1. How often do you have a drink containing alcohol? 0  2. How many drinks containing alcohol do you have on a typical day when you are drinking? 0  3. How often do you have six or more drinks on one occasion? 0  AUDIT-C Score 0   A score of 3 or more in women, and 4 or more in men indicates increased risk for alcohol abuse, EXCEPT if all of the points are from question 1   No results found for any visits on 05/30/22.  Assessment & Plan     Annual wellness visit done today including the all of the following: Reviewed patient's Family Medical History Reviewed and updated list of patient's medical providers Assessment of cognitive impairment was done Assessed patient's functional ability Established a written schedule for health screening [place] Completed and Reviewed  Exercise Activities and Dietary recommendations  Goals      DIET - EAT MORE FRUITS AND VEGETABLES     DIET - REDUCE PORTION SIZE  Recommend cutting portion sizes in half and eating 3 small meals a day with two healthy snacks a day.         Immunization History  Administered Date(s) Administered   Fluad Quad(high Dose 65+) 06/08/2019, 06/20/2020, 04/30/2022   Influenza, High Dose Seasonal PF 07/06/2017, 05/12/2018   PFIZER(Purple Top)SARS-COV-2 Vaccination 10/13/2019, 11/10/2019, 07/19/2020   Pfizer Covid-19 Vaccine Bivalent Booster 26yr & up 10/13/2019, 11/10/2019   Pneumococcal Conjugate-13 05/12/2018   Pneumococcal Polysaccharide-23 06/08/2019   Td 06/08/2019    Tdap 03/14/2009    Health Maintenance  Topic Date Due   MAMMOGRAM  11/03/2019   COVID-19 Vaccine (6 - Pfizer risk series) 06/15/2022 (Originally 09/13/2020)   Zoster Vaccines- Shingrix (1 of 2) 08/30/2022 (Originally [birth year]/1969)   DEXA SCAN  11/03/2023   COLONOSCOPY (Pts 45-471yrInsurance coverage will need to be confirmed)  04/12/2024   TETANUS/TDAP  06/07/2029   Pneumonia Vaccine 6528Years old  Completed   INFLUENZA VACCINE  Completed   Hepatitis C Screening  Completed   HPV VACCINES  Aged Out     Discussed health benefits of physical activity, and encouraged her to engage in regular exercise appropriate for her age and condition.    Problem List Items Addressed This Visit       Cardiovascular and Mediastinum   Primary hypertension    Chronic, stable Continue Lotrel 10-40 Goal <140/<80 Check CBC and CMP      Relevant Medications   amLODipine-benazepril (LOTREL) 10-40 MG capsule   Other Relevant Orders   Comprehensive Metabolic Panel (CMET)   TSH   Lipid panel     Digestive   Gastroesophageal reflux disease without esophagitis    Chronic, stable Continue pepcid 20 mg Denies exacerbations Sees GI for colon cancer screenings      Relevant Medications   famotidine (PEPCID) 20 MG tablet     Other   Depression, recurrent (HCC) - Primary    Chronic, stable Continue Lexapro 10 mg Denies side effects and/or complications Not followed by psych       Relevant Medications   escitalopram (LEXAPRO) 10 MG tablet   Other Relevant Orders   Comprehensive Metabolic Panel (CMET)   TSH   Lipid panel   Screening mammogram for breast cancer    Due for screening for mammogram, denies breast concerns, provided with phone number to call and schedule appointment for mammogram. Encouraged to repeat breast cancer screening every 1-2 years.       Relevant Orders   MM 3D SCREEN BREAST BILATERAL   Return in about 1 year (around 05/31/2023) for annual examination.    I,Vonna KotykFNP, have reviewed all documentation for this visit. The documentation on 05/30/22 for the exam, diagnosis, procedures, and orders are all accurate and complete.  ElGwyneth [phone]) [phone])  [employer]

## 2022-05-31 LAB — COMPREHENSIVE METABOLIC PANEL
ALT: 15 IU/L (ref 0–32)
AST: 20 IU/L (ref 0–40)
Albumin/Globulin Ratio: 2.4 — ABNORMAL HIGH (ref 1.2–2.2)
Albumin: 4.5 g/dL (ref 3.8–4.8)
Alkaline Phosphatase: 77 IU/L (ref 44–121)
BUN/Creatinine Ratio: 21 (ref 12–28)
BUN: 20 mg/dL (ref 8–27)
Bilirubin Total: 0.6 mg/dL (ref 0.0–1.2)
CO2: 24 mmol/L (ref 20–29)
Calcium: 9.4 mg/dL (ref 8.7–10.3)
Chloride: 103 mmol/L (ref 96–106)
Creatinine, Ser: 0.95 mg/dL (ref 0.57–1.00)
Globulin, Total: 1.9 g/dL (ref 1.5–4.5)
Glucose: 88 mg/dL (ref 70–99)
Potassium: 4.7 mmol/L (ref 3.5–5.2)
Sodium: 140 mmol/L (ref 134–144)
Total Protein: 6.4 g/dL (ref 6.0–8.5)
eGFR: 64 mL/min/{1.73_m2} (ref >59)

## 2022-05-31 LAB — LIPID PANEL
Chol/HDL Ratio: 3.1 ratio (ref 0.0–4.4)
Cholesterol, Total: 230 mg/dL — ABNORMAL HIGH (ref 100–199)
HDL: 75 mg/dL (ref >39)
LDL Chol Calc (NIH): 137 mg/dL — ABNORMAL HIGH (ref 0–99)
Triglycerides: 105 mg/dL (ref 0–149)
VLDL Cholesterol Cal: 18 mg/dL (ref 5–40)

## 2022-05-31 LAB — TSH: TSH: 2.04 u[IU]/mL (ref 0.450–4.500)

## 2022-05-31 NOTE — Progress Notes (Signed)
Labs are normal and stable. Cholesterol remains elevated. The 10-year ASCVD risk score (Arnett DK, et al., 2019) is: 14.2%   Values used to calculate the score:     Age: 71 years     Sex: Female     Is Non-Hispanic African American: No     Diabetic: No     Tobacco smoker: No     Systolic Blood Pressure: 141 mmHg     Is BP treated: Yes     HDL Cholesterol: 75 mg/dL     Total Cholesterol: 230 mg/dL Heart attack and stroke risk is 14% estimated within the next 10 years which is high.  Recommend Crestor 10 mg to assist CV risk reduction.  Gwyneth Sprout, [address] [place], [place] [address] [phone] (phone) [phone] (fax) [place]

## 2022-06-02 ENCOUNTER — Encounter: Payer: Self-pay | Admitting: Family Medicine

## 2022-06-06 ENCOUNTER — Encounter: Payer: Self-pay | Admitting: Gastroenterology

## 2022-10-24 ENCOUNTER — Other Ambulatory Visit: Payer: Self-pay | Admitting: Family Medicine

## 2022-10-24 DIAGNOSIS — I10 Essential (primary) hypertension: Secondary | ICD-10-CM

## 2022-10-24 NOTE — Telephone Encounter (Signed)
Medication Refill - Medication: amLODipine-benazepril (LOTREL) 10-40 MG capsule [id number]   Has the patient contacted their pharmacy? Yes.    (Agent: If yes, when and what did the pharmacy advise?) To contact provider   Preferred Pharmacy (with phone number or street name): [facility] in [place]   Has the patient been seen for an appointment in the last year OR does the patient have an upcoming appointment? Yes.    Agent: Please be advised that RX refills may take up to 3 business days. We ask that you follow-up with your pharmacy.   Patient is going on Vacation and needs month supply.

## 2022-10-24 NOTE — Telephone Encounter (Signed)
Unable to refill per protocol, Rx request is too soon. Last refill 05/30/22 for 90 and 3 refills.  Requested Prescriptions  Pending Prescriptions Disp Refills   amLODipine-benazepril (LOTREL) 10-40 MG capsule 90 capsule 3    Sig: Take 1 capsule by mouth daily.     Cardiovascular: CCB + ACEI Combos Passed - 10/24/2022  1:19 PM      Passed - Cr in normal range and within 180 days    Creatinine, Ser  Date Value Ref Range Status  05/30/2022 0.95 0.57 - 1.00 mg/dL Final         Passed - K in normal range and within 180 days    Potassium  Date Value Ref Range Status  05/30/2022 4.7 3.5 - 5.2 mmol/L Final         Passed - Na in normal range and within 180 days    Sodium  Date Value Ref Range Status  05/30/2022 140 134 - 144 mmol/L Final         Passed - eGFR is 30 or above and within 180 days    GFR calc Af Amer  Date Value Ref Range Status  06/20/2020 73 >59 mL/min/1.73 Final    Comment:    **In accordance with recommendations from the NKF-ASN Task force,**   [facility] is in the process of updating its eGFR calculation to the   2021 CKD-EPI creatinine equation that estimates kidney function   without a race variable.    GFR calc non Af Amer  Date Value Ref Range Status  06/20/2020 63 >59 mL/min/1.73 Final   eGFR  Date Value Ref Range Status  05/30/2022 64 >59 mL/min/1.73 Final         Passed - Patient is not pregnant      Passed - Last BP in normal range    BP Readings from Last 1 Encounters:  05/30/22 130/72         Passed - Valid encounter within last 6 months    Recent Outpatient Visits           1 year ago Essential hypertension   [place] Tally Joe T, FNP   2 years ago Seasonal affective disorder [facility])   [place], Clearnce Sorrel, [place]   3 years ago Colon cancer screening   [place] Fenton Malling [place], [place]   4 years ago Need for influenza vaccination   [facility] [place], Clearnce Sorrel, [place]   4 years ago [place], Clearnce Sorrel, [place]       Future Appointments             In 2 months Ralene Bathe, MD [place]

## 2022-10-27 MED ORDER — AMLODIPINE BESY-BENAZEPRIL HCL 10-40 MG PO CAPS: 1.0000 | ORAL_CAPSULE | Freq: Every day | ORAL | 0 refills | Status: DC

## 2022-10-27 NOTE — Telephone Encounter (Signed)
Request to [facility] in [place], [place].

## 2022-10-27 NOTE — Telephone Encounter (Signed)
Pt is calling to check on the status of medication Pt states she is out of medication. Please advise CB- [phone]

## 2022-10-27 NOTE — Addendum Note (Signed)
Addended by: Durwin Nora on: 10/27/2022 01:42 PM   Modules accepted: Orders

## 2022-10-27 NOTE — Telephone Encounter (Signed)
Requested Prescriptions  Pending Prescriptions Disp Refills   amLODipine-benazepril (LOTREL) 10-40 MG capsule 90 capsule 0    Sig: Take 1 capsule by mouth daily.     Cardiovascular: CCB + ACEI Combos Passed - 10/27/2022  1:42 PM      Passed - Cr in normal range and within 180 days    Creatinine, Ser  Date Value Ref Range Status  05/30/2022 0.95 0.57 - 1.00 mg/dL Final         Passed - K in normal range and within 180 days    Potassium  Date Value Ref Range Status  05/30/2022 4.7 3.5 - 5.2 mmol/L Final         Passed - Na in normal range and within 180 days    Sodium  Date Value Ref Range Status  05/30/2022 140 134 - 144 mmol/L Final         Passed - eGFR is 30 or above and within 180 days    GFR calc Af Amer  Date Value Ref Range Status  06/20/2020 73 >59 mL/min/1.73 Final    Comment:    **In accordance with recommendations from the NKF-ASN Task force,**   [facility] is in the process of updating its eGFR calculation to the   2021 CKD-EPI creatinine equation that estimates kidney function   without a race variable.    GFR calc non Af Amer  Date Value Ref Range Status  06/20/2020 63 >59 mL/min/1.73 Final   eGFR  Date Value Ref Range Status  05/30/2022 64 >59 mL/min/1.73 Final         Passed - Patient is not pregnant      Passed - Last BP in normal range    BP Readings from Last 1 Encounters:  05/30/22 130/72         Passed - Valid encounter within last 6 months    Recent Outpatient Visits           1 year ago Essential hypertension   [place] Tally Joe T, FNP   2 years ago Seasonal affective disorder [facility])   [place], Clearnce Sorrel, [place]   3 years ago Colon cancer screening   [place] Fenton Malling [place], [place]   4 years ago Need for influenza vaccination   [facility] [place]enton [place], [place]   4 years ago [place], [place]       Future Appointments             In 2 months Ralene Bathe, MD [place]            Refused Prescriptions Disp Refills   amLODipine-benazepril (LOTREL) 10-40 MG capsule 90 capsule 3    Sig: Take 1 capsule by mouth daily.     Cardiovascular: CCB + ACEI Combos Passed - 10/27/2022  1:42 PM      Passed - Cr in normal range and within 180 days    Creatinine, Ser  Date Value Ref Range Status  05/30/2022 0.95 0.57 - 1.00 mg/dL Final         Passed - K in normal range and within 180 days    Potassium  Date Value Ref Range Status  05/30/2022 4.7 3.5 - 5.2 mmol/L Final         Passed - Na in normal range and within  180 days    Sodium  Date Value Ref Range Status  05/30/2022 140 134 - 144 mmol/L Final         Passed - eGFR is 30 or above and within 180 days    GFR calc Af Amer  Date Value Ref Range Status  06/20/2020 73 >59 mL/min/1.73 Final    Comment:    **In accordance with recommendations from the NKF-ASN Task force,**   [facility] is in the process of updating its eGFR calculation to the   2021 CKD-EPI creatinine equation that estimates kidney function   without a race variable.    GFR calc non Af Amer  Date Value Ref Range Status  06/20/2020 63 >59 mL/min/1.73 Final   eGFR  Date Value Ref Range Status  05/30/2022 64 >59 mL/min/1.73 Final         Passed - Patient is not pregnant      Passed - Last BP in normal range    BP Readings from Last 1 Encounters:  05/30/22 130/72         Passed - Valid encounter within last 6 months    Recent Outpatient Visits           1 year ago Essential hypertension   [place] Tally Joe T, FNP   2 years ago Seasonal affective disorder [facility])   [place], Clearnce Sorrel, [place]   3 years ago Colon cancer screening   [place] Fenton Malling [place], [place]   4 years ago Need for influenza vaccination   [facility] [place], Clearnce Sorrel, [place]   4 years ago [place], Clearnce Sorrel, [place]       Future Appointments             In 2 months Ralene Bathe, MD [place]

## 2022-11-13 ENCOUNTER — Ambulatory Visit
Admission: RE | Admit: 2022-11-13 | Discharge: 2022-11-13 | Disposition: A | Discharge status: Home / Self Care | Payer: Managed Care, Other (non HMO) | Source: Ambulatory Visit | Attending: Family Medicine | Admitting: Family Medicine

## 2022-11-13 DIAGNOSIS — Z1231 Encounter for screening mammogram for malignant neoplasm of breast: Secondary | ICD-10-CM | POA: Diagnosis not present

## 2022-11-14 ENCOUNTER — Other Ambulatory Visit: Payer: Self-pay | Admitting: Family Medicine

## 2022-11-14 DIAGNOSIS — R928 Other abnormal and inconclusive findings on diagnostic imaging of breast: Secondary | ICD-10-CM

## 2022-11-19 ENCOUNTER — Ambulatory Visit
Admission: RE | Admit: 2022-11-19 | Discharge: 2022-11-19 | Disposition: A | Discharge status: Home / Self Care | Payer: Managed Care, Other (non HMO) | Source: Ambulatory Visit | Attending: Family Medicine | Admitting: Family Medicine

## 2022-11-19 DIAGNOSIS — R928 Other abnormal and inconclusive findings on diagnostic imaging of breast: Secondary | ICD-10-CM | POA: Diagnosis not present

## 2022-11-20 ENCOUNTER — Other Ambulatory Visit: Payer: Self-pay | Admitting: Family Medicine

## 2022-11-20 DIAGNOSIS — R599 Enlarged lymph nodes, unspecified: Secondary | ICD-10-CM

## 2022-11-20 DIAGNOSIS — R928 Other abnormal and inconclusive findings on diagnostic imaging of breast: Secondary | ICD-10-CM

## 2022-11-25 ENCOUNTER — Ambulatory Visit
Admission: RE | Admit: 2022-11-25 | Discharge: 2022-11-25 | Disposition: A | Discharge status: Home / Self Care | Payer: Managed Care, Other (non HMO) | Source: Ambulatory Visit | Attending: Family Medicine | Admitting: Family Medicine

## 2022-11-25 DIAGNOSIS — R599 Enlarged lymph nodes, unspecified: Secondary | ICD-10-CM | POA: Insufficient documentation

## 2022-11-25 DIAGNOSIS — R928 Other abnormal and inconclusive findings on diagnostic imaging of breast: Secondary | ICD-10-CM

## 2022-11-25 HISTORY — PX: BREAST BIOPSY: SHX20

## 2022-11-25 MED ORDER — LIDOCAINE-EPINEPHRINE 1 %-1:100000 IJ SOLN
10.0000 mL | Freq: Once | INTRAMUSCULAR | Status: AC
  Administered 2022-11-25: 10 mL via INTRADERMAL

## 2022-11-25 MED ORDER — LIDOCAINE HCL (PF) 1 % IJ SOLN
5.0000 mL | Freq: Once | INTRAMUSCULAR | Status: AC
  Administered 2022-11-25: 5 mL via INTRADERMAL

## 2022-11-26 LAB — SURGICAL PATHOLOGY

## 2023-01-08 ENCOUNTER — Ambulatory Visit: Payer: Managed Care, Other (non HMO) | Admitting: Dermatology

## 2023-01-08 ENCOUNTER — Encounter: Payer: Self-pay | Admitting: Dermatology

## 2023-01-08 VITALS — BP 144/63

## 2023-01-08 DIAGNOSIS — L821 Other seborrheic keratosis: Secondary | ICD-10-CM

## 2023-01-08 DIAGNOSIS — L570 Actinic keratosis: Secondary | ICD-10-CM | POA: Diagnosis not present

## 2023-01-08 DIAGNOSIS — L820 Inflamed seborrheic keratosis: Secondary | ICD-10-CM | POA: Diagnosis not present

## 2023-01-08 DIAGNOSIS — W908XXA Exposure to other nonionizing radiation, initial encounter: Secondary | ICD-10-CM | POA: Diagnosis not present

## 2023-01-08 DIAGNOSIS — L814 Other melanin hyperpigmentation: Secondary | ICD-10-CM

## 2023-01-08 DIAGNOSIS — L578 Other skin changes due to chronic exposure to nonionizing radiation: Secondary | ICD-10-CM

## 2023-01-08 DIAGNOSIS — Z872 Personal history of diseases of the skin and subcutaneous tissue: Secondary | ICD-10-CM

## 2023-01-08 DIAGNOSIS — X32XXXA Exposure to sunlight, initial encounter: Secondary | ICD-10-CM

## 2023-01-08 NOTE — Patient Instructions (Addendum)
Cryotherapy Aftercare  Wash gently with soap and water everyday.   Apply Vaseline and Band-Aid daily until healed.     Due to recent changes in healthcare laws, you may see results of your pathology and/or laboratory studies on MyChart before the doctors have had a chance to review them. We understand that in some cases there may be results that are confusing or concerning to you. Please understand that not all results are received at the same time and often the doctors may need to interpret multiple results in order to provide you with the best plan of care or course of treatment. Therefore, we ask that you please give us 2 business days to thoroughly review all your results before contacting the office for clarification. Should we see a critical lab result, you will be contacted sooner.   If You Need Anything After Your Visit  If you have any questions or concerns for your doctor, please call our main line at [phone] and press option 4 to reach your doctor's medical assistant. If no one answers, please leave a voicemail as directed and we will return your call as soon as possible. Messages left after 4 pm will be answered the following business day.   You may also send us a message via MyChart. We typically respond to MyChart messages within 1-2 business days.  For prescription refills, please ask your pharmacy to contact our office. Our fax number is [phone].  If you have an urgent issue when the clinic is closed that cannot wait until the next business day, you can page your doctor at the number below.    Please note that while we do our best to be available for urgent issues outside of office hours, we are not available 24/7.   If you have an urgent issue and are unable to reach us, you may choose to seek medical care at your doctor's office, retail clinic, urgent care center, or emergency room.  If you have a medical emergency, please immediately call 911 or go to the  emergency department.  Pager Numbers  - Dr. Kowalski: [phone]  - Dr. Moye: [phone]  - Dr. Stewart: [phone]  In the event of inclement weather, please call our main line at [phone] for an update on the status of any delays or closures.  Dermatology Medication Tips: Please keep the boxes that topical medications come in in order to help keep track of the instructions about where and how to use these. Pharmacies typically print the medication instructions only on the boxes and not directly on the medication tubes.   If your medication is too expensive, please contact our office at [phone] option 4 or send us a message through MyChart.   We are unable to tell what your co-pay for medications will be in advance as this is different depending on your insurance coverage. However, we may be able to find a substitute medication at lower cost or fill out paperwork to get insurance to cover a needed medication.   If a prior authorization is required to get your medication covered by your insurance company, please allow us 1-2 business days to complete this process.  Drug prices often vary depending on where the prescription is filled and some pharmacies may offer cheaper prices.  The website www.goodrx.com contains coupons for medications through different pharmacies. The prices here do not account for what the cost may be with help from insurance (it may be cheaper with your insurance), but the website can   give you the price if you did not use any insurance.  - You can print the associated coupon and take it with your prescription to the pharmacy.  - You may also stop by our office during regular business hours and pick up a GoodRx coupon card.  - If you need your prescription sent electronically to a different pharmacy, notify our office through [facility] MyChart or by phone at [phone] option 4.     Si Usted Necesita Algo Despus de Su Visita  Tambin puede  enviarnos un mensaje a travs de MyChart. Por lo general respondemos a los mensajes de MyChart en el transcurso de 1 a 2 das hbiles.  Para renovar recetas, por favor pida a su farmacia que se ponga en contacto con nuestra oficina. Nuestro nmero de fax es el [phone].  Si tiene un asunto urgente cuando la clnica est cerrada y que no puede esperar hasta el siguiente da hbil, puede llamar/localizar a su doctor(a) al nmero que aparece a continuacin.   Por favor, tenga en cuenta que aunque hacemos todo lo posible para estar disponibles para asuntos urgentes fuera del horario de oficina, no estamos disponibles las 24 horas del da, los 7 das de la semana.   Si tiene un problema urgente y no puede comunicarse con nosotros, puede optar por buscar atencin mdica  en el consultorio de su doctor(a), en una clnica privada, en un centro de atencin urgente o en una sala de emergencias.  Si tiene una emergencia mdica, por favor llame inmediatamente al 911 o vaya a la sala de emergencias.  Nmeros de bper  - Dr. Kowalski: [phone]  - Dra. Moye: [phone]  - Dra. Stewart: [phone]  En caso de inclemencias del tiempo, por favor llame a nuestra lnea principal al [phone] para una actualizacin sobre el estado de cualquier retraso o cierre.  Consejos para la medicacin en dermatologa: Por favor, guarde las cajas en las que vienen los medicamentos de uso tpico para ayudarle a seguir las instrucciones sobre dnde y cmo usarlos. Las farmacias generalmente imprimen las instrucciones del medicamento slo en las cajas y no directamente en los tubos del medicamento.   Si su medicamento es muy caro, por favor, pngase en contacto con nuestra oficina llamando al [phone] y presione la opcin 4 o envenos un mensaje a travs de MyChart.   No podemos decirle cul ser su copago por los medicamentos por adelantado ya que esto es diferente dependiendo de la cobertura de su seguro.  Sin embargo, es posible que podamos encontrar un medicamento sustituto a menor costo o llenar un formulario para que el seguro cubra el medicamento que se considera necesario.   Si se requiere una autorizacin previa para que su compaa de seguros cubra su medicamento, por favor permtanos de 1 a 2 das hbiles para completar este proceso.  Los precios de los medicamentos varan con frecuencia dependiendo del lugar de dnde se surte la receta y alguna farmacias pueden ofrecer precios ms baratos.  El sitio web www.goodrx.com tiene cupones para medicamentos de diferentes farmacias. Los precios aqu no tienen en cuenta lo que podra costar con la ayuda del seguro (puede ser ms barato con su seguro), pero el sitio web puede darle el precio si no utiliz ningn seguro.  - Puede imprimir el cupn correspondiente y llevarlo con su receta a la farmacia.  - Tambin puede pasar por nuestra oficina durante el horario de atencin regular y recoger una tarjeta de cupones de GoodRx.  -   Si necesita que su receta se enve electrnicamente a una farmacia diferente, informe a nuestra oficina a travs de MyChart de [place] o por telfono llamando al [phone] y presione la opcin 4.  

## 2023-01-08 NOTE — Progress Notes (Signed)
   Follow-Up Visit   Subjective  [name] is a 72 y.o. female who presents for the following: check spots legs, arms, back, spot L calf 36m, burns, hx of AKS The patient has spots, moles and lesions to be evaluated, some may be new or changing and the patient may have concern these could be cancer.  The following portions of the chart were reviewed this encounter and updated as appropriate: medications, allergies, medical history  Review of Systems:  No other skin or systemic complaints except as noted in HPI or Assessment and Plan.  Objective  Well appearing patient in no apparent distress; mood and affect are within normal limits. A focused examination was performed of the following areas: Face, arms, legs, back  Relevant exam findings are noted in the Assessment and Plan.  hands x 15, forehead x 1, R infranasal x 1 (17) Pink scaly macules  L leg x 2, arms x 16, back x 1 (19) Stuck on waxy paps with erythema   Assessment & Plan   SEBORRHEIC KERATOSIS - Stuck-on, waxy, tan-brown papules and/or plaques  - Benign-appearing - Discussed benign etiology and prognosis. - Observe - Call for any changes  ACTINIC DAMAGE - chronic, secondary to cumulative UV radiation exposure/sun exposure over time - diffuse scaly erythematous macules with underlying dyspigmentation - Recommend daily broad spectrum sunscreen SPF 30+ to sun-exposed areas, reapply every 2 hours as needed.  - Recommend staying in the shade or wearing long sleeves, sun glasses (UVA+UVB protection) and wide brim hats (4-inch brim around the entire circumference of the hat). - Call for new or changing lesions.   LENTIGINES Exam: scattered tan macules Due to sun exposure Treatment Plan: Benign-appearing, observe. Recommend daily broad spectrum sunscreen SPF 30+ to sun-exposed areas, reapply every 2 hours as needed.  Call for any changes   AK (actinic keratosis) (17) hands x 15, forehead x 1, R infranasal x  1  Destruction of lesion - hands x 15, forehead x 1, R infranasal x 1 Complexity: simple   Destruction method: cryotherapy   Informed consent: discussed and consent obtained   Timeout:  patient name, date of birth, surgical site, and procedure verified Lesion destroyed using liquid nitrogen: Yes   Region frozen until ice ball extended beyond lesion: Yes   Outcome: patient tolerated procedure well with no complications   Post-procedure details: wound care instructions given    Inflamed seborrheic keratosis (19) L leg x 2, arms x 16, back x 1  Symptomatic, irritating, patient would like treated.   Destruction of lesion - L leg x 2, arms x 16, back x 1 Complexity: simple   Destruction method: cryotherapy   Informed consent: discussed and consent obtained   Timeout:  patient name, date of birth, surgical site, and procedure verified Lesion destroyed using liquid nitrogen: Yes   Region frozen until ice ball extended beyond lesion: Yes   Outcome: patient tolerated procedure well with no complications   Post-procedure details: wound care instructions given     Return in about 6 months (around 07/11/2023) for AK f/u, recheck R infranasal AK.  I, Ardis Rowan, RMA, am acting as scribe for Armida Sans, MD .  Documentation: I have reviewed the above documentation for accuracy and completeness, and I agree with the above.  Armida Sans, MD

## 2023-01-21 ENCOUNTER — Encounter: Payer: Self-pay | Admitting: Dermatology

## 2023-04-09 ENCOUNTER — Telehealth: Payer: Managed Care, Other (non HMO) | Admitting: Emergency Medicine

## 2023-04-09 DIAGNOSIS — J029 Acute pharyngitis, unspecified: Secondary | ICD-10-CM

## 2023-04-09 NOTE — Progress Notes (Signed)
Because your symptoms are severe, I feel your condition warrants further evaluation and I recommend that you be seen in a face to face visit.   NOTE: There will be NO CHARGE for this eVisit   If you are having a true medical emergency please call 911.      For an urgent face to face visit, [facility] has eight urgent care centers for your convenience:   NEW!! [facility] Health Urgent Care Center at [facility] Get Driving Directions [phone] [address], [address] [place], [address]    [facility] Health Urgent Care Center at [facility] Get Driving Directions [phone] [address] [place], [place] [address]   [facility] Health Urgent Care Center [facility]) Get Driving Directions [phone] [address] [place], [place] [address]  [facility] Health Urgent Care Center [facility]) Get Driving Directions [phone] [address] [place],  [place]  [address]  [facility] Health Urgent Care Center [facility] - at [employer]  [phone] [address] [place],  [place] [address]   [facility] Health Urgent Care at [facility] Get Driving Directions [phone] [address] [place], [place] [address]   [facility] Health Urgent Care at [facility] Get Driving Directions  [phone] [address] [place], [place] [address]   [facility] Health Urgent Care at [facility] Directions [phone] [address] [place], [place] [address]  Your MyChart E-visit questionnaire answers were reviewed by a board certified advanced clinical practitioner to complete your personal care plan based on your specific symptoms.  Thank you for using e-Visits.

## 2023-04-10 ENCOUNTER — Ambulatory Visit: Payer: Managed Care, Other (non HMO) | Admitting: Family Medicine

## 2023-04-11 ENCOUNTER — Emergency Department
Admission: EM | Admit: 2023-04-11 | Discharge: 2023-04-11 | Disposition: A | Discharge status: Home / Self Care | Payer: Managed Care, Other (non HMO) | Attending: Emergency Medicine | Admitting: Emergency Medicine

## 2023-04-11 ENCOUNTER — Other Ambulatory Visit: Payer: Self-pay

## 2023-04-11 ENCOUNTER — Emergency Department: Payer: Managed Care, Other (non HMO)

## 2023-04-11 DIAGNOSIS — J40 Bronchitis, not specified as acute or chronic: Secondary | ICD-10-CM | POA: Insufficient documentation

## 2023-04-11 DIAGNOSIS — R059 Cough, unspecified: Secondary | ICD-10-CM | POA: Diagnosis present

## 2023-04-11 DIAGNOSIS — Z20822 Contact with and (suspected) exposure to covid-19: Secondary | ICD-10-CM | POA: Diagnosis not present

## 2023-04-11 LAB — SARS CORONAVIRUS 2 BY RT PCR: SARS Coronavirus 2 by RT PCR: NEGATIVE

## 2023-04-11 LAB — CBC WITH DIFFERENTIAL/PLATELET
Abs Immature Granulocytes: 0.03 10*3/uL (ref 0.00–0.07)
Basophils Absolute: 0 10*3/uL (ref 0.0–0.1)
Basophils Relative: 0 %
Eosinophils Absolute: 0.2 10*3/uL (ref 0.0–0.5)
Eosinophils Relative: 3 %
HCT: 42.3 % (ref 36.0–46.0)
Hemoglobin: 13.7 g/dL (ref 12.0–15.0)
Immature Granulocytes: 0 %
Lymphocytes Relative: 25 %
Lymphs Abs: 2 10*3/uL (ref 0.7–4.0)
MCH: 28.2 pg (ref 26.0–34.0)
MCHC: 32.4 g/dL (ref 30.0–36.0)
MCV: 87.2 fL (ref 80.0–100.0)
Monocytes Absolute: 0.7 10*3/uL (ref 0.1–1.0)
Monocytes Relative: 9 %
Neutro Abs: 4.8 10*3/uL (ref 1.7–7.7)
Neutrophils Relative %: 63 %
Platelets: 224 10*3/uL (ref 150–400)
RBC: 4.85 MIL/uL (ref 3.87–5.11)
RDW: 13.6 % (ref 11.5–15.5)
WBC: 7.7 10*3/uL (ref 4.0–10.5)
nRBC: 0 % (ref 0.0–0.2)

## 2023-04-11 LAB — COMPREHENSIVE METABOLIC PANEL
ALT: 17 U/L (ref 0–44)
AST: 25 U/L (ref 15–41)
Albumin: 3.9 g/dL (ref 3.5–5.0)
Alkaline Phosphatase: 68 U/L (ref 38–126)
Anion gap: 10 (ref 5–15)
BUN: 17 mg/dL (ref 8–23)
CO2: 25 mmol/L (ref 22–32)
Calcium: 8.5 mg/dL — ABNORMAL LOW (ref 8.9–10.3)
Chloride: 100 mmol/L (ref 98–111)
Creatinine, Ser: 0.93 mg/dL (ref 0.44–1.00)
GFR, Estimated: >60 mL/min (ref >60)
Glucose, Bld: 92 mg/dL (ref 70–99)
Potassium: 4 mmol/L (ref 3.5–5.1)
Sodium: 135 mmol/L (ref 135–145)
Total Bilirubin: 0.7 mg/dL (ref 0.3–1.2)
Total Protein: 7.1 g/dL (ref 6.5–8.1)

## 2023-04-11 LAB — LACTIC ACID, PLASMA: Lactic Acid, Venous: 0.8 mmol/L (ref 0.5–1.9)

## 2023-04-11 MED ORDER — IPRATROPIUM-ALBUTEROL 0.5-2.5 (3) MG/3ML IN SOLN
3.0000 mL | Freq: Once | RESPIRATORY_TRACT | Status: AC
  Administered 2023-04-11: 3 mL via RESPIRATORY_TRACT
  Filled 2023-04-11: qty 3

## 2023-04-11 MED ORDER — ALBUTEROL SULFATE HFA 108 (90 BASE) MCG/ACT IN AERS: 2.0000 | INHALATION_SPRAY | Freq: Four times a day (QID) | RESPIRATORY_TRACT | 2 refills | Status: DC | PRN

## 2023-04-11 MED ORDER — PREDNISONE 50 MG PO TABS: 50.0000 mg | ORAL_TABLET | Freq: Every day | ORAL | 0 refills | Status: DC

## 2023-04-11 MED ORDER — PREDNISONE 20 MG PO TABS
50.0000 mg | ORAL_TABLET | Freq: Once | ORAL | Status: AC
  Administered 2023-04-11: 50 mg via ORAL
  Filled 2023-04-11: qty 1

## 2023-04-11 NOTE — ED Provider Notes (Signed)
[facility] Provider Note    Event Date/Time   First MD Initiated Contact with Patient 04/11/23 1320     (approximate)   History   Cough   HPI  [name] is a 72 y.o. female who presents with complaints of cough for about 5 to 6 days.  Went to urgent care and was told that likely viral illness and recommend supportive care.  No fevers reported     Physical Exam   Triage Vital Signs: ED Triage Vitals  Encounter Vitals Group     BP 04/11/23 1245 (!) 155/72     Systolic BP Percentile --      Diastolic BP Percentile --      Pulse Rate 04/11/23 1245 71     Resp 04/11/23 1245 20     Temp 04/11/23 1245 97.8 F (36.6 C)     Temp Source 04/11/23 1245 Oral     SpO2 04/11/23 1245 97 %     Weight 04/11/23 1247 68 kg (150 lb)     Height 04/11/23 1247 1.575 m (5\' 2" )     Head Circumference --      Peak Flow --      Pain Score 04/11/23 1242 8     Pain Loc --      Pain Education --      Exclude from Growth Chart --     Most recent vital signs: Vitals:   04/11/23 1409 04/11/23 1430  BP: (!) 126/57 (!) 135/58  Pulse: 74 70  Resp: 18 18  Temp:    SpO2: 100% 100%     General: Awake, no distress.  CV:  Good peripheral perfusion.  Resp:  Normal effort.  Scattered wheezing on exam Abd:  No distention.  Other:     ED Results / Procedures / Treatments   Labs (all labs ordered are listed, but only abnormal results are displayed) Labs Reviewed  COMPREHENSIVE METABOLIC PANEL - Abnormal; Notable for the following components:      Result Value   Calcium 8.5 (*)    All other components within normal limits  SARS CORONAVIRUS 2 BY RT PCR  LACTIC ACID, PLASMA  CBC WITH DIFFERENTIAL/PLATELET     EKG     RADIOLOGY Chest x-ray viewed interpret by me, no acute abnormality    PROCEDURES:  Critical Care performed:   Procedures   MEDICATIONS ORDERED IN ED: Medications  ipratropium-albuterol (DUONEB) 0.5-2.5 (3) MG/3ML nebulizer  solution 3 mL (3 mLs Nebulization Given 04/11/23 1344)  ipratropium-albuterol (DUONEB) 0.5-2.5 (3) MG/3ML nebulizer solution 3 mL (3 mLs Nebulization Given 04/11/23 1341)  predniSONE (DELTASONE) tablet 50 mg (50 mg Oral Given 04/11/23 1341)  ipratropium-albuterol (DUONEB) 0.5-2.5 (3) MG/3ML nebulizer solution 3 mL (3 mLs Nebulization Given 04/11/23 1444)     IMPRESSION / MDM / ASSESSMENT AND PLAN / ED COURSE  I reviewed the triage vital signs and the nursing notes. Patient's presentation is most consistent with acute illness / injury with system symptoms.  Patient presents with cough, scattered wheezing on exam, myalgias as above.  Differential includes bronchitis, bronchospasm, pneumonia  COVID test is negative  Lab work reviewed and is reassuring, chest x-ray is clear, no evidence of pneumonia.  Most consistent with viral upper respiratory infection with bronchospasm, patient treated with multiple DuoNebs and p.o. prednisone with improvement, recommend continued supportive care, outpatient follow-up as needed, return precautions         FINAL CLINICAL IMPRESSION(S) / ED DIAGNOSES  Final diagnoses:  Bronchitis     Rx / DC Orders   ED Discharge Orders          Ordered    predniSONE (DELTASONE) 50 MG tablet  Daily with breakfast        04/11/23 1441    albuterol (VENTOLIN HFA) 108 (90 Base) MCG/ACT inhaler  Every 6 hours PRN        04/11/23 1441             Note:  This document was prepared using Dragon voice recognition software and may include unintentional dictation errors.   Jene Every, MD 04/11/23 [id number]

## 2023-04-11 NOTE — ED Triage Notes (Signed)
Pt to ED for cough since 6 days. Cough sounds deep.   Pt had negative covid test on Thursday. Pt thinks may have PNA, complains of feeling fatigued. Started coughing up green phlegm 3 days ago. Respirations are unlabored.

## 2023-04-13 ENCOUNTER — Other Ambulatory Visit: Payer: Self-pay | Admitting: Family Medicine

## 2023-04-13 MED ORDER — AZITHROMYCIN 250 MG PO TABS: ORAL_TABLET | ORAL | 0 refills | Status: AC

## 2023-04-13 NOTE — Progress Notes (Signed)
Patient's husband TASHANNA ARNSON seen today and reports that patient has not improved since being on prednisone and albuterol inhaler at ER last week. Sent prescription for azithromycin to pharmacy, but advised to have her seen if not improving within 2 days or return to ER if sx worsen at all.

## 2023-05-04 ENCOUNTER — Ambulatory Visit (INDEPENDENT_AMBULATORY_CARE_PROVIDER_SITE_OTHER): Payer: Managed Care, Other (non HMO)

## 2023-05-04 VITALS — BP 110/60 | Ht 62.5 in | Wt 151.7 lb

## 2023-05-04 DIAGNOSIS — Z0000 Encounter for general adult medical examination without abnormal findings: Secondary | ICD-10-CM | POA: Diagnosis not present

## 2023-05-04 NOTE — Progress Notes (Addendum)
Subjective:   [name] is a 72 y.o. female who presents for Medicare Annual (Subsequent) preventive examination.  Visit Complete: In person  Patient Medicare AWV questionnaire was completed by the patient on (not done); I have confirmed that all information answered by patient is correct and no changes since this date. Cardiac Risk Factors include: advanced age (>26men, >85 women);hypertension    Objective:    Today's Vitals   05/04/23 1256  Weight: 151 lb 11.2 oz (68.8 kg)  Height: 5' 2.5" (1.588 m)   Body mass index is 27.3 kg/m.     05/04/2023    1:17 PM 04/11/2023   12:47 PM 04/30/2022    1:11 PM 12/20/2019   11:06 AM 12/15/2018   10:53 AM 12/10/2017    1:42 PM 01/23/2015    4:25 PM  Advanced Directives  Does Patient Have a Medical Advance Directive? No No No No No No No  Would patient like information on creating a medical advance directive?   No - Patient declined Yes (ED - Information included in AVS) No - Patient declined No - Patient declined     Current Medications (verified) Outpatient Encounter Medications as of 05/04/2023  Medication Sig   albuterol (VENTOLIN HFA) 108 (90 Base) MCG/ACT inhaler Inhale 2 puffs into the lungs every 6 (six) hours as needed for wheezing or shortness of breath.   amLODipine-benazepril (LOTREL) 10-40 MG capsule Take 1 capsule by mouth daily.   Ascorbic Acid (VITAMIN C) 100 MG tablet Take by mouth daily.   Cholecalciferol (VITAMIN D3) 2000 units TABS Take 5,000 Units by mouth daily.   escitalopram (LEXAPRO) 10 MG tablet Take 1 tablet (10 mg total) by mouth at bedtime.   famotidine (PEPCID) 20 MG tablet Take 1 tablet (20 mg total) by mouth daily.   Menaquinone-7 (VITAMIN K2 PO) Take by mouth.   Omega-3 Fatty Acids (FISH OIL) 1200 MG CAPS Take 1,290 mg by mouth daily.   Potassium 99 MG TABS Take 99 mg by mouth daily.   Zinc Gluconate 50 MG CAPS Take 1 tablet by mouth daily.   benzonatate (TESSALON) 100 MG capsule Take 1 capsule (100  mg total) by mouth 3 (three) times daily as needed for cough. (Patient not taking: Reported on 05/04/2023)   predniSONE (DELTASONE) 50 MG tablet Take 1 tablet (50 mg total) by mouth daily with breakfast. (Patient not taking: Reported on 05/04/2023)   No facility-administered encounter medications on file as of 05/04/2023.    Allergies (verified) Patient has no known allergies.   History: Past Medical History:  Diagnosis Date   Actinic keratosis    Allergy    Anxiety    Breast mass 10/07/2016   left 2:00   Depression    GERD (gastroesophageal reflux disease)    Hypertension    Past Surgical History:  Procedure Laterality Date   ABDOMINAL HYSTERECTOMY  1988   complete; due to fibroids   BLADDER REPAIR  2007   BREAST BIOPSY Left 11/25/2022   [place] Axilla Bx, Path pending   CHOLECYSTECTOMY  2003   COLONOSCOPY  04/16/2009   KNEE SURGERY Left 2012   TONSILLECTOMY  1960   Family History  Problem Relation Age of Onset   Cervical cancer Mother    COPD Mother    COPD Father    Hypertension Father    Colon cancer Neg Hx    Colon polyps Neg Hx    Esophageal cancer Neg Hx    Rectal cancer Neg Hx  Stomach cancer Neg Hx    Social History   Socioeconomic History   Marital status: Married    Spouse name: Genene Churn. Rennis Harding   Number of children: 2   Years of education: Not on file   Highest education level: Associate degree: occupational, Scientist, product/process development, or vocational program  Occupational History   Occupation: retired  Tobacco Use   Smoking status: Never   Smokeless tobacco: Never  Vaping Use   Vaping status: Never Used  Substance and Sexual Activity   Alcohol use: No   Drug use: No   Sexual activity: Not on file  Other Topics Concern   Not on file  Social History Narrative   Not on file   Social Determinants of Health   Financial Resource Strain: Low Risk  (05/04/2023)   Overall Financial Resource Strain (CARDIA)    Difficulty of Paying Living Expenses: Not hard at all   Food Insecurity: No Food Insecurity (05/04/2023)   Hunger Vital Sign    Worried About Running Out of Food in the Last Year: Never true    Ran Out of Food in the Last Year: Never true  Transportation Needs: No Transportation Needs (05/04/2023)   PRAPARE - Administrator, Civil Service (Medical): No    Lack of Transportation (Non-Medical): No  Physical Activity: Insufficiently Active (05/04/2023)   Exercise Vital Sign    Days of Exercise per Week: 4 days    Minutes of Exercise per Session: 30 min  Stress: No Stress Concern Present (05/04/2023)   [employer] of Occupational Health - Occupational Stress Questionnaire    Feeling of Stress : Not at all  Social Connections: Unknown (05/04/2023)   Social Connection and Isolation Panel [NHANES]    Frequency of Communication with Friends and Family: Not on file    Frequency of Social Gatherings with Friends and Family: Once a week    Attends Religious Services: Never    Database administrator or Organizations: No    Attends Engineer, structural: Never    Marital Status: Married    Tobacco Counseling Counseling given: Not Answered   Clinical Intake:  Pre-visit preparation completed: Yes  Pain : No/denies pain    BMI - recorded: 27.3 Nutritional Status: BMI 25 -29 Overweight Nutritional Risks: None Diabetes: No  How often do you need to have someone help you when you read instructions, pamphlets, or other written materials from your doctor or pharmacy?: 1 - Never  Interpreter Needed?: No  Comments: lives with husband Information entered by :: B.Talaya Lamprecht,LPN   Activities of Daily Living    05/04/2023    1:18 PM 05/30/2022   10:08 AM  In your present state of health, do you have any difficulty performing the following activities:  Hearing? 0 0  Vision? 0 0  Difficulty concentrating or making decisions? 0 0  Walking or climbing stairs? 0 0  Dressing or bathing? 0 0  Doing errands, shopping? 0 0   Preparing Food and eating ? N   Using the Toilet? N   In the past six months, have you accidently leaked urine? N   Do you have problems with loss of bowel control? N   Managing your Medications? N   Managing your Finances? N   Housekeeping or managing your Housekeeping? N     Patient Care Team: Jacky Kindle, FNP as PCP - General (Family Medicine) Deirdre Evener, MD as Consulting Physician (Dermatology) Carrie Mew, OD as Consulting Physician (  Optometry) Harold Hedge, MD as Consulting Physician (Obstetrics and Gynecology) Napoleon Form, MD as Consulting Physician (Gastroenterology)  Indicate any recent Medical Services you may have received from other than [facility] providers in the past year (date may be approximate).     Assessment:   This is a routine wellness examination for Marteka.  Hearing/Vision screen Hearing Screening - Comments:: Adequate hearing Vision Screening - Comments:: Adequate vision w/glasses Due for annual   Goals Addressed             This Visit's Progress    COMPLETED: DIET - EAT MORE FRUITS AND VEGETABLES   On track    COMPLETED: DIET - REDUCE PORTION SIZE   On track    Recommend cutting portion sizes in half and eating 3 small meals a day with two healthy snacks a day.        Depression Screen    05/04/2023    1:12 PM 05/30/2022   10:08 AM 04/30/2022    1:09 PM 07/18/2021   10:34 AM 06/20/2020    9:35 AM 12/20/2019   11:03 AM 03/04/2019    9:36 AM  PHQ 2/9 Scores  PHQ - 2 Score 0 0 0 0 0 0 0  PHQ- 9 Score  0 0  0  1    Fall Risk    05/04/2023    1:04 PM 05/30/2022   10:07 AM 05/29/2022   11:02 AM 04/30/2022    1:12 PM 06/20/2020    9:34 AM  Fall Risk   Falls in the past year? 0 0 0 0 0  Number falls in past yr: 0 0 0 0 0  Injury with Fall? 0 0 0 0 0  Risk for fall due to : No Fall Risks No Fall Risks  No Fall Risks No Fall Risks  Follow up Falls prevention discussed;Education provided Falls evaluation completed  Falls  prevention discussed Falls evaluation completed    MEDICARE RISK AT HOME: Medicare Risk at Home Any stairs in or around the home?: Yes If so, are there any without handrails?: Yes Home free of loose throw rugs in walkways, pet beds, electrical cords, etc?: Yes Adequate lighting in your home to reduce risk of falls?: Yes Life alert?: No Use of a cane, walker or w/c?: No Grab bars in the bathroom?: Yes Shower chair or bench in shower?: Yes Elevated toilet seat or a handicapped toilet?: Yes  TIMED UP AND GO:  Was the test performed?  Yes  Length of time to ambulate 10 feet: 8 sec Gait steady and fast without use of assistive device    Cognitive Function:        05/04/2023    1:19 PM  6CIT Screen  What Year? 0 points  What month? 0 points  What time? 0 points  Count back from 20 0 points  Months in reverse 0 points  Repeat phrase 0 points  Total Score 0 points    Immunizations Immunization History  Administered Date(s) Administered   Fluad Quad(high Dose 65+) 06/08/2019, 06/20/2020, 04/30/2022   Influenza, High Dose Seasonal PF 07/06/2017, 05/12/2018   PFIZER(Purple Top)SARS-COV-2 Vaccination 10/13/2019, 11/10/2019, 07/19/2020   Pfizer Covid-19 Vaccine Bivalent Booster 61yrs & up 10/13/2019, 11/10/2019   Pneumococcal Conjugate-13 05/12/2018   Pneumococcal Polysaccharide-23 06/08/2019   Td 06/08/2019   Tdap 03/14/2009    TDAP status: Up to date  Flu Vaccine status: Up to date  Pneumococcal vaccine status: Up to date  Covid-19 vaccine status: Completed vaccines  Qualifies for Shingles Vaccine? Yes   Zostavax completed No   Shingrix Completed?: No.    Education has been provided regarding the importance of this vaccine. Patient has been advised to call insurance company to determine out of pocket expense if they have not yet received this vaccine. Advised may also receive vaccine at local pharmacy or Health Dept. Verbalized acceptance and  understanding.  Screening Tests Health Maintenance  Topic Date Due   Zoster Vaccines- Shingrix (1 of 2) Never done   INFLUENZA VACCINE  03/19/2023   COVID-19 Vaccine (6 - 2023-24 season) 04/19/2023   DEXA SCAN  11/03/2023   MAMMOGRAM  11/13/2023   Colonoscopy  04/12/2024   Medicare Annual Wellness (AWV)  05/03/2024   DTaP/Tdap/Td (3 - Td or Tdap) 06/07/2029   Pneumonia Vaccine 42+ Years old  Completed   Hepatitis C Screening  Completed   HPV VACCINES  Aged Out    Health Maintenance  Health Maintenance Due  Topic Date Due   Zoster Vaccines- Shingrix (1 of 2) Never done   INFLUENZA VACCINE  03/19/2023   COVID-19 Vaccine (6 - 2023-24 season) 04/19/2023    Colorectal cancer screening: Type of screening: Colonoscopy. Completed yes. Repeat every 5-10 years  Mammogram status: Completed yes. Repeat every year  Bone Density status: Completed yes. Results reflect: Bone density results: NORMAL. Repeat every 5 years.  Lung Cancer Screening: (Low Dose CT Chest recommended if Age 73-80 years, 20 pack-year currently smoking OR have quit w/in 15years.) does not qualify.   Lung Cancer Screening Referral: no  Additional Screening:  Hepatitis C Screening: does not qualify; Completed yes  Vision Screening: Recommended annual ophthalmology exams for early detection of glaucoma and other disorders of the eye. Is the patient up to date with their annual eye exam?  No  Who is the provider or what is the name of the office in which the patient attends annual eye exams? Dr Lynnae Prude If pt is not established with a provider, would they like to be referred to a provider to establish care? No .   Dental Screening: Recommended annual dental exams for proper oral hygiene  Diabetic Foot Exam: n/a  Community Resource Referral / Chronic Care Management: CRR required this visit?  No   CCM required this visit?  No    Plan:     I have personally reviewed and noted the following in the  patient's chart:   Medical and social history Use of alcohol, tobacco or illicit drugs  Current medications and supplements including opioid prescriptions. Patient is not currently taking opioid prescriptions. Functional ability and status Nutritional status Physical activity Advanced directives List of other physicians Hospitalizations, surgeries, and ER visits in previous 12 months Vitals Screenings to include cognitive, depression, and falls Referrals and appointments  In addition, I have reviewed and discussed with patient certain preventive protocols, quality metrics, and best practice recommendations. A written personalized care plan for preventive services as well as general preventive health recommendations were provided to patient.     Sue Lush, LPN   2/44/0102   After Visit Summary: (MyChart) Due to this being a telephonic visit, the after visit summary with patients personalized plan was offered to patient via MyChart   Nurse Notes: The patient states she is doing well now. She recently was sick with bronchitis but says she is finally feeling well. Pt has no concerns or questions at this time.

## 2023-05-04 NOTE — Patient Instructions (Addendum)
[name] , Thank you for taking time to come for your Medicare Wellness Visit. I appreciate your ongoing commitment to your health goals. Please review the following plan we discussed and let me know if I can assist you in the future.   Referrals/Orders/Follow-Ups/Clinician Recommendations: none  This is a list of the screening recommended for you and due dates:  Health Maintenance  Topic Date Due   Zoster (Shingles) Vaccine (1 of 2) Never done   Flu Shot  03/19/2023   COVID-19 Vaccine (6 - 2023-24 season) 04/19/2023   DEXA scan (bone density measurement)  11/03/2023   Mammogram  11/13/2023   Colon Cancer Screening  04/12/2024   Medicare Annual Wellness Visit  05/03/2024   DTaP/Tdap/Td vaccine (3 - Td or Tdap) 06/07/2029   Pneumonia Vaccine  Completed   Hepatitis C Screening  Completed   HPV Vaccine  Aged Out    Advanced directives: (Declined) Advance directive discussed with you today. Even though you declined this today, please call our office should you change your mind, and we can give you the proper paperwork for you to fill out.  Next Medicare Annual Wellness Visit scheduled for next year: Yes 05/04/24 @ 1pm in person

## 2023-05-25 ENCOUNTER — Other Ambulatory Visit: Payer: Self-pay | Admitting: Family Medicine

## 2023-05-25 DIAGNOSIS — F339 Major depressive disorder, recurrent, unspecified: Secondary | ICD-10-CM

## 2023-05-25 NOTE — Telephone Encounter (Signed)
Requested Prescriptions  Pending Prescriptions Disp Refills   escitalopram (LEXAPRO) 10 MG tablet [Pharmacy Med Name: ESCITALOPRAM TABS 10MG ] 90 tablet 0    Sig: TAKE 1 TABLET AT BEDTIME     Psychiatry:  Antidepressants - SSRI Passed - 05/25/2023  1:42 AM      Passed - Completed PHQ-2 or PHQ-9 in the last 360 days      Passed - Valid encounter within last 6 months    Recent Outpatient Visits           1 year ago Essential hypertension   [place] [facility] Merita Norton T, FNP   2 years ago Seasonal affective disorder [facility])   [place] [facility] [place], [place], [place]   4 years ago Colon cancer screening   [facility] [place], [place] Dike [place], [place]   5 years ago Need for influenza vaccination   [facility] [place], [place], [place]   5 years ago Poison oak dermatitis   [facility] [place], [place], [place]       Future Appointments             In 1 month Deirdre Evener, MD [facility]

## 2023-06-03 ENCOUNTER — Telehealth: Payer: Managed Care, Other (non HMO) | Admitting: Physician Assistant

## 2023-06-03 DIAGNOSIS — L237 Allergic contact dermatitis due to plants, except food: Secondary | ICD-10-CM | POA: Diagnosis not present

## 2023-06-03 MED ORDER — PREDNISONE 10 MG PO TABS: ORAL_TABLET | ORAL | 0 refills | Status: AC

## 2023-06-03 NOTE — Progress Notes (Signed)
I have spent 5 minutes in review of e-visit questionnaire, review and updating patient chart, medical decision making and response to patient.   Mia Milan Cody Jacklynn Dehaas, PA-C    

## 2023-06-03 NOTE — Progress Notes (Signed)
E-Visit for [employer]  We are sorry that you are not feeing well.  Here is how we plan to help!  Based on what you have shared with me it looks like you have had an allergic reaction to the oily resin from a group of plants.  This resin is very sticky, so it easily attaches to your skin, clothing, tools equipment, and pet's fur.    This blistering rash is often called poison ivy rash although it can come from contact with the leaves, stems and roots of poison ivy, poison oak and poison sumac.  The oily resin contains urushiol (u-ROO-she-ol) that produces a skin rash on exposed skin.  The severity of the rash depends on the amount of urushiol that gets on your skin.  A section of skin with more urushiol on it may develop a rash sooner.  The rash usually develops 12-48 hours after exposure and can last two to three weeks.  Your skin must come in direct contact with the plant's oil to be affected.  Blister fluid doesn't spread the rash.  However, if you come into contact with a piece of clothing or pet fur that has urushiol on it, the rash may spread out.  You can also transfer the oil to other parts of your body with your fingers.  Often the rash looks like a straight line because of the way the plant brushes against your skin.  Since your rash is widespread or has resulted in a large number of blisters, I have prescribed an oral corticosteroid.  Please follow these recommendations:  I have sent a prednisone dose pack to your chosen pharmacy. Be sure to follow the instructions carefully and complete the entire prescription. You may use Benadryl or Caladryl topical lotions to sooth the itch and remember cool, not hot, showers and baths can help relieve the itching!  Place cool, wet compresses on the affected area for 15-30 minutes several times a day.  You may also take oral antihistamines, such as diphenhydramine (Benadryl, others), which may also help you sleep better.  Watch your skin for any purulent  (pus) drainage or red streaking from the site.  If this occurs, contact your provider.  You may require an antibiotic for a skin infection.  Make sure that the clothes you were wearing as well as any towels or sheets that may have come in contact with the oil (urushiol) are washed in detergent and hot water.       I have developed the following plan to treat your condition I am prescribing a two week course of steroids (37 tablets of 10 mg prednisone).  Days 1-4 take 4 tablets (40 mg) daily  Days 5-8 take 3 tablets (30 mg) daily, Days 9-11 take 2 tablets (20 mg) daily, Days 12-14 take 1 tablet (10 mg) daily.    What can you do to prevent this rash?  Avoid the plants.  Learn how to identify poison ivy, poison oak and poison sumac in all seasons.  When hiking or engaging in other activities that might expose you to these plants, try to stay on cleared pathways.  If camping, make sure you pitch your tent in an area free of these plants.  Keep pets from running through wooded areas so that urushiol doesn't accidentally stick to their fur, which you may touch.  Remove or kill the plants.  In your yard, you can get rid of poison ivy by applying an herbicide or pulling it out of  the ground, including the roots, while wearing heavy gloves.  Afterward remove the gloves and thoroughly wash them and your hands.  Don't burn poison ivy or related plants because the urushiol can be carried by smoke.  Wear protective clothing.  If needed, protect your skin by wearing socks, boots, pants, long sleeves and vinyl gloves.  Wash your skin right away.  Washing off the oil with soap and water within 30 minutes of exposure may reduce your chances of getting a poison ivy rash.  Even washing after an hour or so can help reduce the severity of the rash.  If you walk through some poison ivy and then later touch your shoes, you may get some urushiol on your hands, which may then transfer to your face or body by touching or  rubbing.  If the contaminated object isn't cleaned, the urushiol on it can still cause a skin reaction years later.    Be careful not to reuse towels after you have washed your skin.  Also carefully wash clothing in detergent and hot water to remove all traces of the oil.  Handle contaminated clothing carefully so you don't transfer the urushiol to yourself, furniture, rugs or appliances.  Remember that pets can carry the oil on their fur and paws.  If you think your pet may be contaminated with urushiol, put on some long rubber gloves and give your pet a bath.  Finally, be careful not to burn these plants as the smoke can contain traces of the oil.  Inhaling the smoke may result in difficulty breathing. If that occurred you should see a physician as soon as possible.  See your doctor right away if:  The reaction is severe or widespread You inhaled the smoke from burning poison ivy and are having difficulty breathing Your skin continues to swell The rash affects your eyes, mouth or genitals Blisters are oozing pus You develop a fever greater than 100 F (37.8 C) The rash doesn't get better within a few weeks.  If you scratch the poison ivy rash, bacteria under your fingernails may cause the skin to become infected.  See your doctor if pus starts oozing from the blisters.  Treatment generally includes antibiotics.  Poison ivy treatments are usually limited to self-care methods.  And the rash typically goes away on its own in two to three weeks.     If the rash is widespread or results in a large number of blisters, your doctor may prescribe an oral corticosteroid, such as prednisone.  If a bacterial infection has developed at the rash site, your doctor may give you a prescription for an oral antibiotic.  MAKE SURE YOU  Understand these instructions. Will watch your condition. Will get help right away if you are not doing well or get worse.   Thank you for choosing an e-visit.  Your  e-visit answers were reviewed by a board certified advanced clinical practitioner to complete your personal care plan. Depending upon the condition, your plan could have included both over the counter or prescription medications.  Please review your pharmacy choice. Make sure the pharmacy is open so you can pick up prescription now. If there is a problem, you may contact your provider through [employer] and have the prescription routed to another pharmacy.  Your safety is important to [place]. If you have drug allergies check your prescription carefully.   For the next 24 hours you can use MyChart to ask questions about today's visit, request a non-urgent  call back, or ask for a work or school excuse. You will get an email in the next two days asking about your experience. I hope that your e-visit has been valuable and will speed your recovery.

## 2023-06-04 ENCOUNTER — Ambulatory Visit: Payer: Managed Care, Other (non HMO) | Admitting: Family Medicine

## 2023-07-03 ENCOUNTER — Other Ambulatory Visit: Payer: Self-pay | Admitting: Family Medicine

## 2023-07-03 DIAGNOSIS — I10 Essential (primary) hypertension: Secondary | ICD-10-CM

## 2023-07-03 NOTE — Telephone Encounter (Signed)
Requested Prescriptions  Pending Prescriptions Disp Refills   amLODipine-benazepril (LOTREL) 10-40 MG capsule [Pharmacy Med Name: AMLODIPINE/BENAZEPRIL CAPS 10/40MG ] 90 capsule 0    Sig: TAKE 1 CAPSULE DAILY     Cardiovascular: CCB + ACEI Combos Passed - 07/03/2023  1:04 AM      Passed - Cr in normal range and within 180 days    Creatinine, Ser  Date Value Ref Range Status  04/11/2023 0.93 0.44 - 1.00 mg/dL Final         Passed - K in normal range and within 180 days    Potassium  Date Value Ref Range Status  04/11/2023 4.0 3.5 - 5.1 mmol/L Final         Passed - Na in normal range and within 180 days    Sodium  Date Value Ref Range Status  04/11/2023 135 135 - 145 mmol/L Final  05/30/2022 140 134 - 144 mmol/L Final         Passed - eGFR is 30 or above and within 180 days    GFR calc Af Amer  Date Value Ref Range Status  06/20/2020 73 >59 mL/min/1.73 Final    Comment:    **In accordance with recommendations from the NKF-ASN Task force,**   [facility] is in the process of updating its eGFR calculation to the   2021 CKD-EPI creatinine equation that estimates kidney function   without a race variable.    GFR, Estimated  Date Value Ref Range Status  04/11/2023 >60 >60 mL/min Final    Comment:    (NOTE) Calculated using the CKD-EPI Creatinine Equation (2021)    eGFR  Date Value Ref Range Status  05/30/2022 64 >59 mL/min/1.73 Final         Passed - Patient is not pregnant      Passed - Last BP in normal range    BP Readings from Last 1 Encounters:  05/04/23 110/60         Passed - Valid encounter within last 6 months    Recent Outpatient Visits           1 year ago Essential hypertension   [facility] Merita Norton T, FNP   3 years ago Seasonal affective disorder [facility])   [facility] [place], Alessandra Bevel[place], [place]   4 years ago Colon cancer screening   [facility] [place], Victor[place] Dike [place],  [place]   5 years ago Need for influenza vaccination   [facility] [place], Alessandra Bevel[place], [place]   5 years ago Poison oak dermatitis   [facility] [place], Alessandra [place], [place]       Future Appointments             In 2 weeks Deirdre Evener, MD [facility]

## 2023-07-22 ENCOUNTER — Encounter: Payer: Self-pay | Admitting: Dermatology

## 2023-07-22 ENCOUNTER — Ambulatory Visit: Payer: Managed Care, Other (non HMO) | Attending: Internal Medicine | Admitting: Physical Therapy

## 2023-07-22 ENCOUNTER — Ambulatory Visit: Payer: Managed Care, Other (non HMO) | Admitting: Dermatology

## 2023-07-22 ENCOUNTER — Encounter: Payer: Self-pay | Admitting: Physical Therapy

## 2023-07-22 DIAGNOSIS — M5459 Other low back pain: Secondary | ICD-10-CM | POA: Insufficient documentation

## 2023-07-22 DIAGNOSIS — L570 Actinic keratosis: Secondary | ICD-10-CM

## 2023-07-22 DIAGNOSIS — M5416 Radiculopathy, lumbar region: Secondary | ICD-10-CM | POA: Insufficient documentation

## 2023-07-22 DIAGNOSIS — L814 Other melanin hyperpigmentation: Secondary | ICD-10-CM

## 2023-07-22 DIAGNOSIS — W908XXA Exposure to other nonionizing radiation, initial encounter: Secondary | ICD-10-CM

## 2023-07-22 DIAGNOSIS — L578 Other skin changes due to chronic exposure to nonionizing radiation: Secondary | ICD-10-CM | POA: Diagnosis not present

## 2023-07-22 DIAGNOSIS — L820 Inflamed seborrheic keratosis: Secondary | ICD-10-CM

## 2023-07-22 DIAGNOSIS — M6281 Muscle weakness (generalized): Secondary | ICD-10-CM | POA: Diagnosis present

## 2023-07-22 DIAGNOSIS — D692 Other nonthrombocytopenic purpura: Secondary | ICD-10-CM | POA: Diagnosis not present

## 2023-07-22 NOTE — Progress Notes (Signed)
Follow-Up Visit   Subjective  [name] is a 72 y.o. female who presents for the following: 7 month AK follow up. Tx with LN2 at last visit. Hands, forehead, R infranasal.  ISKs Tx with Lne left leg, arms, back.   The patient has spots, moles and lesions to be evaluated, some may be new or changing and the patient may have concern these could be cancer.    The following portions of the chart were reviewed this encounter and updated as appropriate: medications, allergies, medical history  Review of Systems:  No other skin or systemic complaints except as noted in HPI or Assessment and Plan.  Objective  Well appearing patient in no apparent distress; mood and affect are within normal limits.  A focused examination was performed of the following areas: Face, right calf Relevant physical exam findings are noted in the Assessment and Plan.  Left Hand Dorsum x2, R wrist x1, R hand x3, chest x1, L temple x1, R temple x1, R forehead x1 (10) Erythematous thin papules/macules with gritty scale.   Right Upper Eyelid x1, L neck x1, L inframammary x1, L waistline x1, R calf x2 (6) Erythematous keratotic or waxy stuck-on papule or plaque.    Assessment & Plan   AK (actinic keratosis) (10) Left Hand Dorsum x2, R wrist x1, R hand x3, chest x1, L temple x1, R temple x1, R forehead x1  Actinic keratoses are precancerous spots that appear secondary to cumulative UV radiation exposure/sun exposure over time. They are chronic with expected duration over 1 year. A portion of actinic keratoses will progress to squamous cell carcinoma of the skin. It is not possible to reliably predict which spots will progress to skin cancer and so treatment is recommended to prevent development of skin cancer.  Recommend daily broad spectrum sunscreen SPF 30+ to sun-exposed areas, reapply every 2 hours as needed.  Recommend staying in the shade or wearing long sleeves, sun glasses (UVA+UVB protection) and  wide brim hats (4-inch brim around the entire circumference of the hat). Call for new or changing lesions.  Destruction of lesion - Left Hand Dorsum x2, R wrist x1, R hand x3, chest x1, L temple x1, R temple x1, R forehead x1 (10) Complexity: simple   Destruction method: cryotherapy   Informed consent: discussed and consent obtained   Timeout:  patient name, date of birth, surgical site, and procedure verified Lesion destroyed using liquid nitrogen: Yes   Region frozen until ice ball extended beyond lesion: Yes   Outcome: patient tolerated procedure well with no complications   Post-procedure details: wound care instructions given   Additional details:  Prior to procedure, discussed risks of blister formation, small wound, skin dyspigmentation, or rare scar following cryotherapy. Recommend Vaseline ointment to treated areas while healing.   Inflamed seborrheic keratosis (6) Right Upper Eyelid x1, L neck x1, L inframammary x1, L waistline x1, R calf x2  Symptomatic, irritating, patient would like treated.  Destruction of lesion - Right Upper Eyelid x1, L neck x1, L inframammary x1, L waistline x1, R calf x2 (6) Complexity: simple   Destruction method: cryotherapy   Informed consent: discussed and consent obtained   Timeout:  patient name, date of birth, surgical site, and procedure verified Lesion destroyed using liquid nitrogen: Yes   Region frozen until ice ball extended beyond lesion: Yes   Outcome: patient tolerated procedure well with no complications   Post-procedure details: wound care instructions given   Additional details:  Prior to procedure, discussed risks of blister formation, small wound, skin dyspigmentation, or rare scar following cryotherapy. Recommend Vaseline ointment to treated areas while healing.   Purpura - Chronic; persistent and recurrent.  Treatable, but not curable. - Violaceous macules and patches - Benign - Related to trauma, age, sun damage and/or use of  blood thinners, chronic use of topical and/or oral steroids - Observe - Can use OTC arnica containing moisturizer such as Dermend Bruise Formula if desired - Call for worsening or other concerns  LENTIGINES Exam: scattered tan macules Due to sun exposure Treatment Plan: Benign-appearing, observe. Recommend daily broad spectrum sunscreen SPF 30+ to sun-exposed areas, reapply every 2 hours as needed.  Call for any changes  ACTINIC DAMAGE - chronic, secondary to cumulative UV radiation exposure/sun exposure over time - diffuse scaly erythematous macules with underlying dyspigmentation - Recommend daily broad spectrum sunscreen SPF 30+ to sun-exposed areas, reapply every 2 hours as needed.  - Recommend staying in the shade or wearing long sleeves, sun glasses (UVA+UVB protection) and wide brim hats (4-inch brim around the entire circumference of the hat). - Call for new or changing lesions.    Return in about 1 year (around 07/21/2024) for AK Follow Up.  I, Lawson Radar, CMA, am acting as scribe for Armida Sans, MD.   Documentation: I have reviewed the above documentation for accuracy and completeness, and I agree with the above.  Armida Sans, MD

## 2023-07-22 NOTE — Patient Instructions (Addendum)

## 2023-07-22 NOTE — Therapy (Signed)
OUTPATIENT PHYSICAL THERAPY THORACOLUMBAR EVALUATION   Patient Name: [name] MRN: [id number] DOB:11-02-1950, 72 y.o., female Today's Date: 07/22/2023  END OF SESSION:  PT End of Session - 07/22/23 1017     Visit Number 1    Number of Visits 13    Date for PT Re-Evaluation 09/03/23    Authorization Type Cign[employer]/Medicare [id number]    PT Start Time 0902    PT Stop Time 0949    PT Time Calculation (min) 47 min    Activity Tolerance Patient limited by pain    Behavior During Therapy [facility] for tasks assessed/performed             Past Medical History:  Diagnosis Date   Actinic keratosis    Allergy    Anxiety    Breast mass 10/07/2016   left 2:00   Depression    GERD (gastroesophageal reflux disease)    Hypertension    Past Surgical History:  Procedure Laterality Date   ABDOMINAL HYSTERECTOMY  1988   complete; due to fibroids   BLADDER REPAIR  2007   BREAST BIOPSY Left 11/25/2022   [place] Axilla Bx, Path pending   CHOLECYSTECTOMY  2003   COLONOSCOPY  04/16/2009   KNEE SURGERY Left 2012   TONSILLECTOMY  1960   Patient Active Problem List   Diagnosis Date Noted   Annual physical exam 05/30/2022   Depression, recurrent (HCC) 05/30/2022   Primary hypertension 05/30/2022   Screening mammogram for breast cancer 05/30/2022   Gastroesophageal reflux disease without esophagitis 07/18/2021    PCP: Jacky Kindle, FNP  REFERRING PROVIDER: Lazarus Gowda*  REFERRING DIAG:  M54.16 (ICD-10-CM) - Radiculopathy, lumbar region    RATIONALE FOR EVALUATION AND TREATMENT: Rehabilitation  THERAPY DIAG: Other low back pain  Radiculopathy, lumbar region  Muscle weakness (generalized)  ONSET DATE: Mid-October 2024;   FOLLOW-UP APPT SCHEDULED WITH REFERRING PROVIDER: Yes    SUBJECTIVE:                                                                                                                                                                                          SUBJECTIVE STATEMENT:  [name] with Hx of low back/R hip pain, no radiographic explanation of hip pain; current referral for lumbar radiculopathy.   PERTINENT HISTORY: [name] with Hx of low back/R hip pain, no radiographic explanation of hip pain; current referral for lumbar radiculopathy. Pt reports Hx of bone spur in R hip that doesn't typically bother her that much; she had injection in it last year. Pt reports pain across her back and down her  LLE; numbness down L leg down to L foot. Pt reports feeling unbalanced and unstable on her feet. Pt reports she used to walk up to 10,000 steps/day; she reports she has not done this lately. Symptoms began mid-October when she was bathing her dog in shower; she was bent over bathing dog and felt notable pain upon standing afterward. Pt is using Gabapentin for neuralgia management; OxyContin for severe pain episodes about 3 times total now. Pt reports swelling in ankles after prolonged standing, no history of peripheral vascular disease.   PAIN:    Pain Intensity: Present: 5-6/10, Worst: 8-9/10 Pain location: Across low back, pain down LLE, heaviness into L thigh Pain Quality: sharp and dull ; "numbing and tingling" Radiating: Yes, down LLE Numbness/Tingling: Yes; down LLE to toes, into great toe especially  Focal Weakness: Yes; LLE weakness, intermittent difficulty weightbearing Aggravating factors: standing "any length of time," sit to stand after prolonged sitting, turning over in bed, transferring from car, up/down stairs  Relieving factors: Gabapentin, heating pad, TENS, stretching, heat 24-hour pain behavior: Worse in PM previously; no 24-hour pain pattern now  How long can you stand: 30 minutes standing in kitchen tolerated; then, she has to lie down/get off feet  History of prior back injury, pain, surgery, or therapy: Yes; Hx of chiropractic for this episode of pain initially   Imaging: Yes  ; Lin Landsman, Karie Fetch, MD   EXAM: Magnetic resonance imaging, spinal canal and contents, lumbar, without contrast material. DATE: 07/06/2023 11:24 AM ACCESSION: [id number] UN DICTATED: 07/06/2023 11:37 AM   --Multilevel degenerative changes with varying degrees of spinal canal and neuroforaminal stenosis, most pronounced at L4-L5 and L5-S1.  --Prominent masslike signal abnormality along middle/lower pole of right kidney, indeterminate and may reflect a lipomatous lesion or artifact from adjacent bowel. Recommend dedicated evaluation with contrast enhanced CT to rule out possibility of mass lesion.     Minna Antis [place], [place]   CT Scan - Renal 07/21/23 EXAM: CT RENAL MASS (RENAL MASS PROTOCOL) ACCESSION: [id number] UN   CLINICAL INDICATION: follow up MRI finding - N28.89 - Renal mass  FINDINGS:  LOWER CHEST: Left lower lobe pulmonary nodules measuring up to 0.3 cm (11:17, 11:14).  LIVER: Normal liver contour. No focal liver lesions.  BILIARY: Gallbladder is surgically absent. Common bile duct dilation measuring up to 0.8 cm (13:39), likely secondary to postcholecystectomy state.  SPLEEN: Subcentimeter splenic hypodensity (11:19), too small to further characterize by CT.  PANCREAS: Normal pancreatic contour. No focal lesions. No ductal dilation.  ADRENAL GLANDS: Normal appearance of the adrenal glands.  KIDNEYS: Right Kidney: Right kidney exophytic interpolar anterior heterogeneous enhancing fat-containing mass without calcifications measuring 9.4 x 7.2 x 7.2 cm (11:54, 13:41). No obstructing calculi. No hydronephrosis. Left Kidney: Homogeneous enhancement. No obstructing calculi. No hydronephrosis. No suspicious enhancing mass.  GI TRACT: No findings of bowel obstruction or acute inflammation.  PERITONEUM, RETROPERITONEUM AND MESENTERY: No free air. No ascites. No fluid collection.  LYMPH NODES: No adenopathy.  VESSELS: Hepatic and portal veins are  patent. Normal caliber aorta.  BONES and SOFT TISSUES: No aggressive osseous lesions. Small fat-containing umbilical hernia.  IMPRESSION: -Fat-containing right renal lesion measuring up to 9.4 cm with imaging characteristics most consistent with a benign angiomyolipoma. However, given size, lesion carries risk for spontaneous bleeding and retroperitoneal hemorrhage.  --Left basilar pulmonary micronodules. If patient is at increased risk for lung cancer, consider follow-up chest CT without contrast in 12 months.     Red flags: Negative for bowel/bladder changes,  saddle paresthesia, personal history of cancer, h/o spinal tumors, h/o compression fx, h/o abdominal aneurysm, abdominal pain, chills/fever, night sweats, nausea, vomiting, unrelenting pain, first onset of insidious LBP <20 y/o  -Difficulty with urinary frequency/urgency, urinary incontinence   PRECAUTIONS: None  WEIGHT BEARING RESTRICTIONS: No  FALLS: Has patient fallen in last 6 months? No  Living Environment Lives with: lives with their spouse; husband travels for work; pt is at home alone Tues/Wed/Thur Lives in: House/apartment; 2-level home, Oceanographer bedroom Stairs: Yes: Internal: 13 steps; on left going up and External: 3 steps; can reach both handrails; 4 steps at backdoor Has following equipment at home: None  Prior level of function: Independent  Occupational demands: Retired  Presenter, broadcasting: Social research officer, government, cooking, walking routine  Patient Goals: Get back to normal activities   OBJECTIVE:  Patient Surveys  FOTO 25/predicted outcome score of 50  Cognition Patient is oriented to person, place, and time.  Recent memory is intact.  Remote memory is intact.  Attention span and concentration are intact.  Expressive speech is intact.  Patient's fund of knowledge is within normal limits for educational level.    Gross Musculoskeletal Assessment Tremor: None Bulk: Normal Tone: Normal No visible step-off along  spinal column, no signs of scoliosis Gower's sign when getting up from chair  GAIT: Distance walked: 50 ft Assistive device utilized: None Level of assistance: Complete Independence Comments: Decreased arm swing/trunk rotation, slow velocity and step cadence  Posture: Lumbar lordosis: WNL Mild rounded shoulders  Iliac crest height: Equal bilaterally Lumbar lateral shift: Negative Guarded posture, pt maintains pressure through hands in sitting  AROM AROM (Normal range in degrees) AROM  07/22/23  Lumbar   Flexion (65) 50%* (pain at waist L>R)  Extension (30) 25%* (numbness into toes)  Right lateral flexion (25) 75%*  Left lateral flexion (25) 75% (mild symptoms)  Right rotation (30) 25%*  Left rotation (30) 25%      Hip Right Left  Flexion (125) 120* 120*  Extension (15)    Abduction (40)    Adduction     Internal Rotation (45) 30 20*  External Rotation (45) 45 45*      (* = pain; Blank rows = not tested)  LE MMT: MMT (out of 5) Right 07/22/23 Left 07/22/23  Hip flexion 4* 3+*  Hip extension    Hip abduction 4- 4-  Hip adduction    Hip internal rotation    Hip external rotation    Knee flexion 5 4-*  Knee extension 5 3+*  Ankle dorsiflexion 5 3+*  Ankle plantarflexion    Ankle inversion    Ankle eversion    (* = pain; Blank rows = not tested)  Sensation Grossly intact to light touch throughout bilateral LEs as determined by testing dermatomes L2-S2. Proprioception, stereognosis, and hot/cold testing deferred on this date.  Reflexes R/L Knee Jerk (L3/4): 2+/2+  Ankle Jerk (S1/2): 2+/1+   Muscle Length Hamstrings: R: Positive L: Positive [place] (quadriceps): R: Not examined L: Not examined Thomas (hip flexors): R: Not examined L: Not examined   Palpation Location Right Left         Lumbar paraspinals 1 1  Quadratus Lumborum    Gluteus Maximus 1 1  Gluteus Medius  1  Deep hip external rotators  0  PSIS  1  Fortin's Area (SIJ)    Greater Trochanter     (Blank rows = not tested) Graded on 0-4 scale (0 = no pain, 1 = pain, 2 = pain  with wincing/grimacing/flinching, 3 = pain with withdrawal, 4 = unwilling to allow palpation)  Passive Accessory Intervertebral Motion Deferred  Special Tests Lumbar Radiculopathy and Discogenic: Centralization and Peripheralization (SN 92, -LR 0.12): Repeated flexion in lying; pressure in back during, no worse after Slump (SN 83, -LR 0.32): R: Positive L: Positive SLR (SN 92, -LR 0.29): R: Positive L:  Positive   Lumbar Foraminal Stenosis: Lumbar quadrant (SN 70): R: Negative L: Positive     TODAY'S TREATMENT: DATE: 07/22/23    Therapeutic Exercise - for HEP establishment, discussion on appropriate exercise/activity modification, PT education   Reviewed baseline home exercises and provided handout for [employer] program (see Access Code); tactile cueing and therapist demonstration utilized as needed for carryover of proper technique to HEP.    Patient education on current condition, anatomy involved, prognosis, plan of care. Discussion on activity modification to prevent flare-up of condition, including breaking up standing time, completing chores on stool/seated as able, use of cart when completing errands to improve tolerance of shopping/prolonged walking.       PATIENT EDUCATION:  Education details: see above for patient education details Person educated: Patient Education method: Explanation, Demonstration, and Handouts Education comprehension: verbalized understanding and returned demonstration   HOME EXERCISE PROGRAM:  Access Code: [email or web address] URL: [email or web address] Date: 07/26/2023 Prepared by: Consuela Mimes  Exercises - Hooklying Lumbar Traction  - 3 x daily - 7 x weekly - 2 sets - 10 reps - Supine Double Knee to Chest  - 5-6 x daily - 7 x weekly - 1 sets - 10 reps - 1sec hold    ASSESSMENT:  CLINICAL IMPRESSION: Patient is a 72 y.o. female who was seen  today for physical therapy evaluation and treatment for low back pain with LLE referral. Clinical presentation is consistent with nerve root involvement due to lumbar spinal stenosis. Mass was found along right kidney incidentally with lumbar spine MRI; follow-up CT imaging found this to be a benign angiomyolipoma. Pt does report some urinary urgency/frequency and some incontinence. I recommended following up with this when speaking with physician about follow-up imaging findings. Patient's lower quarter symptoms are peripheralized with extension. Pt feels pressure localized along low back with repeated flexion in lying with no significant symptoms reported in lying afterward.  She has current deficits in thoracolumbar AROM, posterior chain soft tissue mobility/dural and neural mobility, decreased hip and LLE strength, and sensitivity to palpation along L>R lumbar paraspinals and gluteal musculature. Pt will continue to benefit from skilled PT services to address deficits and improve function.   OBJECTIVE IMPAIRMENTS: decreased activity tolerance, difficulty walking, decreased ROM, decreased strength, hypomobility, impaired flexibility, and pain.   ACTIVITY LIMITATIONS: standing, squatting, stairs, transfers, bed mobility, and locomotion level  PARTICIPATION LIMITATIONS: meal prep, cleaning, driving, shopping, and community activity  PERSONAL FACTORS: Age, Past/current experiences, and 3+ comorbidities: (depression, HTN, GERD, anxiety)  are also affecting patient's functional outcome.   REHAB POTENTIAL: Good  CLINICAL DECISION MAKING: Evolving/moderate complexity  EVALUATION COMPLEXITY: Moderate   GOALS: Goals reviewed with patient? Yes  SHORT TERM GOALS: Target date: 08/13/2023  Pt will be independent with HEP in order to improve strength and decrease back pain to improve pain-free function at home and work. Baseline: 07/22/23: Baseline HEP initiated.  Goal status: INITIAL   LONG TERM  GOALS: Target date: 09/03/2023  Pt will increase FOTO to at least 50 to demonstrate significant improvement in function at home and work related to back pain  Baseline: 07/22/23: 25 Goal status: INITIAL  2.  Pt will decrease worst back pain by at least 2 points on the NPRS in order to demonstrate clinically significant reduction in back pain. Baseline: 07/22/23: 8-9/10 Goal status: INITIAL  3.  Patient will have full thoracolumbar AROM without reproduction of pain as needed for reaching items on ground, household chores, bending.     Baseline: 07/22/23: Pain and motion loss in all directions Goal status: INITIAL  4.  Pt will demonstrate negotiation of full flight of steps with reciprocal pattern and no movement deviations and no increase in lower quarter pain as needed for community-level mobility  Baseline: 07/22/23: Significant limitation with stair negotiation Goal status: INITIAL  5.  Pt will perform sit to stand with no UE assist and no reproduction of pain as needed for self-care and home-level mobility  Baseline: 07/22/23: Significant pain behaviors and Gower's sign when getting up from seat Goal status: INITIAL   PLAN: PT FREQUENCY: 1-2x/week  PT DURATION: 6 weeks  PLANNED INTERVENTIONS: Therapeutic exercises, Therapeutic activity, Neuromuscular re-education, Balance training, Gait training, Patient/Family education, Self Care, Joint mobilization, Joint manipulation, Vestibular training, Canalith repositioning, Orthotic/Fit training, DME instructions, Dry Needling, Electrical stimulation, Spinal manipulation, Spinal mobilization, Cryotherapy, Moist heat, Taping, Traction, Ultrasound, Ionotophoresis 4mg /ml Dexamethasone, Manual therapy, and Re-evaluation.  PLAN FOR NEXT SESSION: Manual lumbar traction, repeated flexion/flexion principle MDT, posterior chain mobility/nerve flossing, manual techniques for soft tissue sensitivity prn.    Consuela Mimes, PT, DPT #[id number]  Gertie Exon, PT 07/22/2023, 10:18 AM

## 2023-07-27 ENCOUNTER — Ambulatory Visit: Payer: Self-pay

## 2023-07-27 NOTE — Telephone Encounter (Signed)
Patient scheduled to see you tomorrow. Please review

## 2023-07-27 NOTE — Telephone Encounter (Signed)
  Chief Complaint: moderate lower back pain, incontinence Symptoms:  shooting pain down left leg with toe numbness/ incontinence (since November)Frequency: mid Oct Disposition: [] ED /[] Urgent Care (no appt availability in office) / [x] Appointment(In office/virtual)/ []  [facility]/ [] Home Care/ [] Refused Recommended Disposition /[] [facility]/ []  Follow-up with PCP Additional Notes: routing note to provider Reason for Disposition  [1] MODERATE back pain (e.g., interferes with normal activities) AND [2] present > 3 days  Answer Assessment - Initial Assessment Questions 1. ONSET: "When did the pain begin?"      Mid Oct 2. LOCATION: "Where does it hurt?" (upper, mid or lower back)     Left side across lower back leg to toes (numbness)  L4 to S1 3. SEVERITY: "How bad is the pain?"  (e.g., Scale 1-10; mild, moderate, or severe)   - MILD (1-3): Doesn't interfere with normal activities.    - MODERATE (4-7): Interferes with normal activities or awakens from sleep.    - SEVERE (8-10): Excruciating pain, unable to do any normal activities.      7/10 varies with activity 4. PATTERN: "Is the pain constant?" (e.g., yes, no; constant, intermittent)      constant 5. RADIATION: "Does the pain shoot into your legs or somewhere else?"     yes 6. CAUSE:  "What do you think is causing the back pain?"      L4 to S1 disc degeneration-- right kidney tumor as large as an orange November found in MRI 8. MEDICINES: "What have you taken so far for the pain?" (e.g., nothing, acetaminophen, NSAIDS)     Gabapentin 9. NEUROLOGIC SYMPTOMS: "Do you have any weakness, numbness, or problems with bowel/bladder control?"     Left toes numb  10. OTHER SYMPTOMS: "Do you have any other symptoms?" (e.g., fever, abdomen pain, burning with urination, blood in urine)       Also has tumor  Protocols used: Back Pain-A-AH

## 2023-07-28 ENCOUNTER — Encounter: Payer: Self-pay | Admitting: Physical Therapy

## 2023-07-28 ENCOUNTER — Encounter: Payer: Self-pay | Admitting: Family Medicine

## 2023-07-28 ENCOUNTER — Ambulatory Visit: Payer: Managed Care, Other (non HMO) | Admitting: Physical Therapy

## 2023-07-28 ENCOUNTER — Ambulatory Visit (INDEPENDENT_AMBULATORY_CARE_PROVIDER_SITE_OTHER): Payer: Managed Care, Other (non HMO) | Admitting: Family Medicine

## 2023-07-28 VITALS — BP 133/79 | HR 65 | Resp 16 | Ht 62.0 in | Wt 155.0 lb

## 2023-07-28 DIAGNOSIS — N2889 Other specified disorders of kidney and ureter: Secondary | ICD-10-CM

## 2023-07-28 DIAGNOSIS — M5416 Radiculopathy, lumbar region: Secondary | ICD-10-CM

## 2023-07-28 DIAGNOSIS — M5459 Other low back pain: Secondary | ICD-10-CM

## 2023-07-28 DIAGNOSIS — M6281 Muscle weakness (generalized): Secondary | ICD-10-CM

## 2023-07-28 DIAGNOSIS — R918 Other nonspecific abnormal finding of lung field: Secondary | ICD-10-CM | POA: Diagnosis not present

## 2023-07-28 NOTE — Progress Notes (Signed)
Acute Office Visit  Subjective:     Patient ID: [name], female    DOB: November 07, 1950, 72 y.o.   MRN: [id number]  Chief Complaint  Patient presents with   Back Pain    Back Pain   Discussed the use of AI scribe software for clinical note transcription with the patient, who gave verbal consent to proceed.  History of Present Illness   The patient, a 72 year old with a history of back pain and high blood pressure, presents with a recently discovered kidney mass. The patient reports that the back pain started in mid-October and has progressively worsened. The pain is located in the lower back and radiates down the left leg, which the patient attributes to sciatica. The patient has been managing the back pain with physical therapy at home and has an appointment for professional physical therapy later in the day.  In addition to the back pain, the patient reports fatigue and constipation. The fatigue is severe enough that the patient often needs to rest during the day and has had episodes of barely making it through the day. The patient believes that the kidney mass may be contributing to these symptoms.  The kidney mass was discovered during an MRI scan for the back pain. The mass is large, about the size of an orange, and is believed to be a benign angiomyolipoma. The patient reports episodes of pain in the area where the kidney is located. The patient is concerned about the mass potentially bleeding and is seeking guidance on treatment options.       Review of Systems  Musculoskeletal:  Positive for back pain.        Objective:    BP 133/79 (BP Location: Right Arm, Patient Position: Sitting, Cuff Size: Normal)   Pulse 65   Resp 16   Ht 5\' 2"  (1.575 m)   Wt 155 lb (70.3 kg)   BMI 28.35 kg/m    Physical Exam Vitals reviewed.  Constitutional:      General: She is not in acute distress.    Appearance: Normal appearance. She is well-developed. She is not diaphoretic.   HENT:     Head: Normocephalic and atraumatic.  Eyes:     General: No scleral icterus.    Conjunctiva/sclera: Conjunctivae normal.  Neck:     Thyroid: No thyromegaly.  Cardiovascular:     Rate and Rhythm: Normal rate and regular rhythm.     Heart sounds: Normal heart sounds. No murmur heard. Pulmonary:     Effort: Pulmonary effort is normal. No respiratory distress.     Breath sounds: Normal breath sounds. No wheezing, rhonchi or rales.  Musculoskeletal:     Cervical back: Neck supple.     Right lower leg: No edema.     Left lower leg: No edema.     Comments: No CVA tenderness TTP along lower back, not midline  Lymphadenopathy:     Cervical: No cervical adenopathy.  Skin:    General: Skin is warm and dry.     Findings: No rash.  Neurological:     Mental Status: She is alert and oriented to person, place, and time. Mental status is at baseline.  Psychiatric:        Mood and Affect: Mood normal.        Behavior: Behavior normal.     No results found for any visits on 07/28/23.  CT Abdomen: Interpolar anterior heterogeneous enhancing fat-containing mass without calcifications, 9.5 cm on right kidney;  left kidney normal; no liver abnormalities; few small 0.3 cm nodules in lungs (October 2024)     Assessment & Plan:   Problem List Items Addressed This Visit   None Visit Diagnoses     Right renal mass    -  Primary   Relevant Orders   Ambulatory referral to Urology   Multiple pulmonary nodules              Kidney Angiomyolipoma Large mass, likely angiomyolipoma (9.5 cm) on the right kidney potentially contributing to symptoms such as constipation, fatigue, and intermittent pain under back R rib cage. The mass is non-cancerous but poses a bleeding risk due to its size. Experiencing pain in the right flank and under the right ribcage. Discussed the risks of bleeding and the need for surgical intervention. Prefers local procedure due to convenience and insurance  considerations. - Refer to urology for evaluation and management - Discuss surgical options with urology  Lumbar Degenerative Disc Disease Degenerative changes in the lumbar spine (L4, L5, S1) causing chronic low back pain and sciatica, primarily affecting the left leg. Undergoing physical therapy and has seen an orthopedic specialist. Pain management includes physical therapy and potential follow-up with orthopedics. - Continue physical therapy - Follow up with orthopedic specialist as needed  Pulmonary Nodules Small pulmonary nodules (0.3 cm) identified on imaging. Secondhand smoke exposure but never smoked. Nodules are likely benign but require monitoring. Discussed the need for a follow-up CT scan in one year to monitor for changes. - Needs a follow-up CT scan of the lungs in one year        Return if symptoms worsen or fail to improve.  Shirlee Latch, MD

## 2023-07-28 NOTE — Therapy (Unsigned)
OUTPATIENT PHYSICAL THERAPY TREATMENT   Patient Name: [name] MRN: [id number] DOB:03-Sep-1950, 72 y.o., female Today's Date: 07/28/2023  END OF SESSION:  PT End of Session - 07/30/23 1230     Visit Number 2    Number of Visits 13    Date for PT Re-Evaluation 09/03/23    Authorization Type [employer]/Medicare 2024    PT Start Time 1301    PT Stop Time 1345    PT Time Calculation (min) 44 min    Activity Tolerance Patient limited by pain    Behavior During Therapy [facility] for tasks assessed/performed               Past Medical History:  Diagnosis Date   Actinic keratosis    Allergy    Anxiety    Breast mass 10/07/2016   left 2:00   Depression    GERD (gastroesophageal reflux disease)    Hypertension    Past Surgical History:  Procedure Laterality Date   ABDOMINAL HYSTERECTOMY  1988   complete; due to fibroids   BLADDER REPAIR  2007   BREAST BIOPSY Left 11/25/2022   [place] Axilla Bx, Path pending   CHOLECYSTECTOMY  2003   COLONOSCOPY  04/16/2009   KNEE SURGERY Left 2012   TONSILLECTOMY  1960   Patient Active Problem List   Diagnosis Date Noted   Annual physical exam 05/30/2022   Depression, recurrent (HCC) 05/30/2022   Primary hypertension 05/30/2022   Screening mammogram for breast cancer 05/30/2022   Gastroesophageal reflux disease without esophagitis 07/18/2021    PCP: Jacky Kindle, FNP  REFERRING PROVIDER: Lazarus Gowda*  REFERRING DIAG:  M54.16 (ICD-10-CM) - Radiculopathy, lumbar region    RATIONALE FOR EVALUATION AND TREATMENT: Rehabilitation  THERAPY DIAG: Other low back pain  Radiculopathy, lumbar region  Muscle weakness (generalized)  ONSET DATE: Mid-October 2024;   FOLLOW-UP APPT SCHEDULED WITH REFERRING PROVIDER: Yes    PERTINENT HISTORY: [name] with Hx of low back/R hip pain, no radiographic explanation of hip pain; current referral for lumbar radiculopathy. Pt reports Hx of bone spur in R hip  that doesn't typically bother her that much; she had injection in it last year. Pt reports pain across her back and down her LLE; numbness down L leg down to L foot. Pt reports feeling unbalanced and unstable on her feet. Pt reports she used to walk up to 10,000 steps/day; she reports she has not done this lately. Symptoms began mid-October when she was bathing her dog in shower; she was bent over bathing dog and felt notable pain upon standing afterward. Pt is using Gabapentin for neuralgia management; OxyContin for severe pain episodes about 3 times total now. Pt reports swelling in ankles after prolonged standing, no history of peripheral vascular disease.   PAIN:    Pain Intensity: Present: 5-6/10, Worst: 8-9/10 Pain location: Across low back, pain down LLE, heaviness into L thigh Pain Quality: sharp and dull ; "numbing and tingling" Radiating: Yes, down LLE Numbness/Tingling: Yes; down LLE to toes, into great toe especially  Focal Weakness: Yes; LLE weakness, intermittent difficulty weightbearing Aggravating factors: standing "any length of time," sit to stand after prolonged sitting, turning over in bed, transferring from car, up/down stairs  Relieving factors: Gabapentin, heating pad, TENS, stretching, heat 24-hour pain behavior: Worse in PM previously; no 24-hour pain pattern now  How long can you stand: 30 minutes standing in kitchen tolerated; then, she has to lie down/get off feet  History  of prior back injury, pain, surgery, or therapy: Yes; Hx of chiropractic for this episode of pain initially   Imaging: Yes ; Lin Landsman, Karie Fetch, MD   EXAM: Magnetic resonance imaging, spinal canal and contents, lumbar, without contrast material. DATE: 07/06/2023 11:24 AM ACCESSION: [id number] UN DICTATED: 07/06/2023 11:37 AM   --Multilevel degenerative changes with varying degrees of spinal canal and neuroforaminal stenosis, most pronounced at L4-L5 and L5-S1.  --Prominent  masslike signal abnormality along middle/lower pole of right kidney, indeterminate and may reflect a lipomatous lesion or artifact from adjacent bowel. Recommend dedicated evaluation with contrast enhanced CT to rule out possibility of mass lesion.     Minna Antis [place], [place]   CT Scan - Renal 07/21/23 EXAM: CT RENAL MASS (RENAL MASS PROTOCOL) ACCESSION: [id number] UN   CLINICAL INDICATION: follow up MRI finding - N28.89 - Renal mass  FINDINGS:  LOWER CHEST: Left lower lobe pulmonary nodules measuring up to 0.3 cm (11:17, 11:14).  LIVER: Normal liver contour. No focal liver lesions.  BILIARY: Gallbladder is surgically absent. Common bile duct dilation measuring up to 0.8 cm (13:39), likely secondary to postcholecystectomy state.  SPLEEN: Subcentimeter splenic hypodensity (11:19), too small to further characterize by CT.  PANCREAS: Normal pancreatic contour. No focal lesions. No ductal dilation.  ADRENAL GLANDS: Normal appearance of the adrenal glands.  KIDNEYS: Right Kidney: Right kidney exophytic interpolar anterior heterogeneous enhancing fat-containing mass without calcifications measuring 9.4 x 7.2 x 7.2 cm (11:54, 13:41). No obstructing calculi. No hydronephrosis. Left Kidney: Homogeneous enhancement. No obstructing calculi. No hydronephrosis. No suspicious enhancing mass.  GI TRACT: No findings of bowel obstruction or acute inflammation.  PERITONEUM, RETROPERITONEUM AND MESENTERY: No free air. No ascites. No fluid collection.  LYMPH NODES: No adenopathy.  VESSELS: Hepatic and portal veins are patent. Normal caliber aorta.  BONES and SOFT TISSUES: No aggressive osseous lesions. Small fat-containing umbilical hernia.  IMPRESSION: -Fat-containing right renal lesion measuring up to 9.4 cm with imaging characteristics most consistent with a benign angiomyolipoma. However, given size, lesion carries risk for spontaneous bleeding and retroperitoneal hemorrhage.  --Left  basilar pulmonary micronodules. If patient is at increased risk for lung cancer, consider follow-up chest CT without contrast in 12 months.     Red flags: Negative for bowel/bladder changes, saddle paresthesia, personal history of cancer, h/o spinal tumors, h/o compression fx, h/o abdominal aneurysm, abdominal pain, chills/fever, night sweats, nausea, vomiting, unrelenting pain, first onset of insidious LBP <20 y/o  -Difficulty with urinary frequency/urgency, urinary incontinence   Living Environment Lives with: lives with their spouse; husband travels for work; pt is at home alone Tues/Wed/Thur Lives in: House/apartment; 2-level home, Oceanographer bedroom Stairs: Yes: Internal: 13 steps; on left going up and External: 3 steps; can reach both handrails; 4 steps at backdoor Has following equipment at home: None  Prior level of function: Independent  Occupational demands: Retired  Presenter, broadcasting: Social research officer, government, cooking, walking routine  Patient Goals: Get back to normal activities    OBJECTIVE:   GAIT: Distance walked: 50 ft Assistive device utilized: None Level of assistance: Complete Independence Comments: Decreased arm swing/trunk rotation, slow velocity and step cadence  Posture: Lumbar lordosis: WNL Mild rounded shoulders  Iliac crest height: Equal bilaterally Lumbar lateral shift: Negative Guarded posture, pt maintains pressure through hands in sitting  AROM AROM (Normal range in degrees) AROM  07/22/23  Lumbar   Flexion (65) 50%* (pain at waist L>R)  Extension (30) 25%* (numbness into toes)  Right lateral flexion (25) 75%*  Left lateral flexion (  25) 75% (mild symptoms)  Right rotation (30) 25%*  Left rotation (30) 25%      Hip Right Left  Flexion (125) 120* 120*  Extension (15)    Abduction (40)    Adduction     Internal Rotation (45) 30 20*  External Rotation (45) 45 45*      (* = pain; Blank rows = not tested)  LE MMT: MMT (out of 5) Right 07/22/23  Left 07/22/23  Hip flexion 4* 3+*  Hip extension    Hip abduction 4- 4-  Hip adduction    Hip internal rotation    Hip external rotation    Knee flexion 5 4-*  Knee extension 5 3+*  Ankle dorsiflexion 5 3+*  Ankle plantarflexion    Ankle inversion    Ankle eversion    (* = pain; Blank rows = not tested)  Sensation Grossly intact to light touch throughout bilateral LEs as determined by testing dermatomes L2-S2. Proprioception, stereognosis, and hot/cold testing deferred on this date.  Reflexes R/L Knee Jerk (L3/4): 2+/2+  Ankle Jerk (S1/2): 2+/1+   Muscle Length Hamstrings: R: Positive L: Positive [place] (quadriceps): R: Not examined L: Not examined Thomas (hip flexors): R: Not examined L: Not examined   Palpation Location Right Left         Lumbar paraspinals 1 1  Quadratus Lumborum    Gluteus Maximus 1 1  Gluteus Medius  1  Deep hip external rotators  0  PSIS  1  Fortin's Area (SIJ)    Greater Trochanter    (Blank rows = not tested) Graded on 0-4 scale (0 = no pain, 1 = pain, 2 = pain with wincing/grimacing/flinching, 3 = pain with withdrawal, 4 = unwilling to allow palpation)  Passive Accessory Intervertebral Motion Deferred  Special Tests Lumbar Radiculopathy and Discogenic: Centralization and Peripheralization (SN 92, -LR 0.12): Repeated flexion in lying; pressure in back during, no worse after Slump (SN 83, -LR 0.32): R: Positive L: Positive SLR (SN 92, -LR 0.29): R: Positive L:  Positive   Lumbar Foraminal Stenosis: Lumbar quadrant (SN 70): R: Negative L: Positive     TODAY'S TREATMENT: DATE: 07/30/2023   SUBJECTIVE STATEMENT:   Pt reports stressors and not feeling well; stress related to findings in kidney. Patient reports significant low back pain and leg pain at arrival to PT. Patient reports 8/10 pain this AM, 7/10 pain at arrival to PT. Patient reports doing okay with her exercises - she states that exercises help "for a little while." She states  that Dr. Enis Slipper gave her some exercises to work on also including sidebend stretches.     Manual Therapy - for symptom modulation, soft tissue sensitivity and mobility, joint mobility, ROM    Manual lumbar traction in supine with Mulligan belt; 10 sec on, 10 sec off; x 8 minutes for nerve root decompression and symptom modulation    Therapeutic Exercise - for improved soft tissue flexibility and extensibility as needed for ROM, dural/neural mobility as needed to reduce neuralgias/paresthesias,   Double knee to chest; 1x10, 1 sec Lower trunk rotations; 1x10 alternating R/L Posterior pelvic tilt; 2x10 Sciatic nerve flossing, hooklying; 2x10, 1 sec hold    Seated blue physioball rollout; 3-way (anterior and anterolateral R/L);  x 6 ea dir    PATIENT EDUCATION: HEP update and review. Discussed holding on repeated movement if experiencing heightened leg pain.    MHP (unbilled) utilized post-treatment for analgesic effect and improved soft tissue extensibility, along low back  with pt sitting in chair with upright lumbar support; x  5 minutes.   PATIENT EDUCATION:  Education details: see above for patient education details Person educated: Patient Education method: Explanation, Demonstration, and Handouts Education comprehension: verbalized understanding and returned demonstration   HOME EXERCISE PROGRAM:  Access Code: [email or web address] URL: [email or web address] Date: 07/28/2023 Prepared by: Consuela Mimes  Exercises - Hooklying Lumbar Traction  - 3 x daily - 7 x weekly - 2 sets - 10 reps - Supine Double Knee to Chest  - 5-6 x daily - 7 x weekly - 1 sets - 10 reps - 1sec hold - Supine Lower Trunk Rotation  - 2 x daily - 7 x weekly - 2 sets - 10 reps - Supine 90/90 Sciatic Nerve Glide with Knee Flexion/Extension  - 2 x daily - 7 x weekly - 2 sets - 10 reps - 1-2sec hold   ASSESSMENT:  CLINICAL IMPRESSION: Patient continues to exhibit with debilitating symptoms,  though she is pleasant throughout PT session. She has good response to traction with significant abatement of symptoms. Pt does have ongoing pain with changing positions on table, rolling, supine to sitting, and sit to stand. Pt tolerates repeated flexion well. We added neurodynamics and posterior pelvic tilt to program today - pt tolerates new exercises well.  Pt has remaining deficits in thoracolumbar AROM, posterior chain soft tissue mobility/dural and neural mobility, decreased hip and LLE strength, and sensitivity to palpation along L>R lumbar paraspinals and gluteal musculature. Pt will continue to benefit from skilled PT services to address deficits and improve function.   OBJECTIVE IMPAIRMENTS: decreased activity tolerance, difficulty walking, decreased ROM, decreased strength, hypomobility, impaired flexibility, and pain.   ACTIVITY LIMITATIONS: standing, squatting, stairs, transfers, bed mobility, and locomotion level  PARTICIPATION LIMITATIONS: meal prep, cleaning, driving, shopping, and community activity  PERSONAL FACTORS: Age, Past/current experiences, and 3+ comorbidities: (depression, HTN, GERD, anxiety)  are also affecting patient's functional outcome.   REHAB POTENTIAL: Good  CLINICAL DECISION MAKING: Evolving/moderate complexity  EVALUATION COMPLEXITY: Moderate   GOALS: Goals reviewed with patient? Yes  SHORT TERM GOALS: Target date: 08/13/2023  Pt will be independent with HEP in order to improve strength and decrease back pain to improve pain-free function at home and work. Baseline: 07/22/23: Baseline HEP initiated.  Goal status: INITIAL   LONG TERM GOALS: Target date: 09/03/2023  Pt will increase FOTO to at least 50 to demonstrate significant improvement in function at home and work related to back pain  Baseline: 07/22/23: 25 Goal status: INITIAL  2.  Pt will decrease worst back pain by at least 2 points on the NPRS in order to demonstrate clinically significant  reduction in back pain. Baseline: 07/22/23: 8-9/10 Goal status: INITIAL  3.  Patient will have full thoracolumbar AROM without reproduction of pain as needed for reaching items on ground, household chores, bending.     Baseline: 07/22/23: Pain and motion loss in all directions Goal status: INITIAL  4.  Pt will demonstrate negotiation of full flight of steps with reciprocal pattern and no movement deviations and no increase in lower quarter pain as needed for community-level mobility  Baseline: 07/22/23: Significant limitation with stair negotiation Goal status: INITIAL  5.  Pt will perform sit to stand with no UE assist and no reproduction of pain as needed for self-care and home-level mobility  Baseline: 07/22/23: Significant pain behaviors and Gower's sign when getting up from seat Goal status: INITIAL   PLAN: PT FREQUENCY: 1-2x/week  PT DURATION: 6 weeks  PLANNED INTERVENTIONS: Therapeutic exercises, Therapeutic activity, Neuromuscular re-education, Balance training, Gait training, Patient/Family education, Self Care, Joint mobilization, Joint manipulation, Vestibular training, Canalith repositioning, Orthotic/Fit training, DME instructions, Dry Needling, Electrical stimulation, Spinal manipulation, Spinal mobilization, Cryotherapy, Moist heat, Taping, Traction, Ultrasound, Ionotophoresis 4mg /ml Dexamethasone, Manual therapy, and Re-evaluation.  PLAN FOR NEXT SESSION: Manual lumbar traction, repeated flexion/flexion principle MDT, posterior chain mobility/nerve flossing, manual techniques for soft tissue sensitivity prn.    Consuela Mimes, PT, DPT #[id number]  Gertie Exon, PT 07/30/2023, 12:30 PM

## 2023-07-30 ENCOUNTER — Encounter: Payer: Self-pay | Admitting: Physical Therapy

## 2023-07-30 ENCOUNTER — Ambulatory Visit: Payer: Managed Care, Other (non HMO) | Admitting: Physical Therapy

## 2023-07-30 DIAGNOSIS — M5459 Other low back pain: Secondary | ICD-10-CM | POA: Diagnosis not present

## 2023-07-30 DIAGNOSIS — M5416 Radiculopathy, lumbar region: Secondary | ICD-10-CM

## 2023-07-30 DIAGNOSIS — M6281 Muscle weakness (generalized): Secondary | ICD-10-CM

## 2023-07-30 NOTE — Therapy (Addendum)
OUTPATIENT PHYSICAL THERAPY TREATMENT   Patient Name: [name] MRN: [id number] DOB:1950-10-16, 72 y.o., female Today's Date: 07/30/2023   END OF SESSION:  PT End of Session - 07/30/23 1302     Visit Number 3    Number of Visits 13    Date for PT Re-Evaluation 09/03/23    Authorization Type [employer]/Medicare [id number]    PT Start Time 1302    PT Stop Time 1347    PT Time Calculation (min) 45 min    Activity Tolerance Patient limited by pain    Behavior During Therapy [facility] for tasks assessed/performed                Past Medical History:  Diagnosis Date   Actinic keratosis    Allergy    Anxiety    Breast mass 10/07/2016   left 2:00   Depression    GERD (gastroesophageal reflux disease)    Hypertension    Past Surgical History:  Procedure Laterality Date   ABDOMINAL HYSTERECTOMY  1988   complete; due to fibroids   BLADDER REPAIR  2007   BREAST BIOPSY Left 11/25/2022   [place] Axilla Bx, Path pending   CHOLECYSTECTOMY  2003   COLONOSCOPY  04/16/2009   KNEE SURGERY Left 2012   TONSILLECTOMY  1960   Patient Active Problem List   Diagnosis Date Noted   Annual physical exam 05/30/2022   Depression, recurrent (HCC) 05/30/2022   Primary hypertension 05/30/2022   Screening mammogram for breast cancer 05/30/2022   Gastroesophageal reflux disease without esophagitis 07/18/2021    PCP: Jacky Kindle, FNP  REFERRING PROVIDER: Lazarus Gowda*  REFERRING DIAG:  M54.16 (ICD-10-CM) - Radiculopathy, lumbar region    RATIONALE FOR EVALUATION AND TREATMENT: Rehabilitation  THERAPY DIAG: Other low back pain  Radiculopathy, lumbar region  Muscle weakness (generalized)  ONSET DATE: Mid-October 2024;   FOLLOW-UP APPT SCHEDULED WITH REFERRING PROVIDER: Yes    PERTINENT HISTORY: [name] with Hx of low back/R hip pain, no radiographic explanation of hip pain; current referral for lumbar radiculopathy. Pt reports Hx of bone spur in R  hip that doesn't typically bother her that much; she had injection in it last year. Pt reports pain across her back and down her LLE; numbness down L leg down to L foot. Pt reports feeling unbalanced and unstable on her feet. Pt reports she used to walk up to 10,000 steps/day; she reports she has not done this lately. Symptoms began mid-October when she was bathing her dog in shower; she was bent over bathing dog and felt notable pain upon standing afterward. Pt is using Gabapentin for neuralgia management; OxyContin for severe pain episodes about 3 times total now. Pt reports swelling in ankles after prolonged standing, no history of peripheral vascular disease.   PAIN:    Pain Intensity: Present: 5-6/10, Worst: 8-9/10 Pain location: Across low back, pain down LLE, heaviness into L thigh Pain Quality: sharp and dull ; "numbing and tingling" Radiating: Yes, down LLE Numbness/Tingling: Yes; down LLE to toes, into great toe especially  Focal Weakness: Yes; LLE weakness, intermittent difficulty weightbearing Aggravating factors: standing "any length of time," sit to stand after prolonged sitting, turning over in bed, transferring from car, up/down stairs  Relieving factors: Gabapentin, heating pad, TENS, stretching, heat 24-hour pain behavior: Worse in PM previously; no 24-hour pain pattern now  How long can you stand: 30 minutes standing in kitchen tolerated; then, she has to lie down/get off feet  History of prior back injury, pain, surgery, or therapy: Yes; Hx of chiropractic for this episode of pain initially   Imaging: Yes ; Lin Landsman, Karie Fetch, MD   EXAM: Magnetic resonance imaging, spinal canal and contents, lumbar, without contrast material. DATE: 07/06/2023 11:24 AM ACCESSION: [id number] UN DICTATED: 07/06/2023 11:37 AM   --Multilevel degenerative changes with varying degrees of spinal canal and neuroforaminal stenosis, most pronounced at L4-L5 and L5-S1.  --Prominent  masslike signal abnormality along middle/lower pole of right kidney, indeterminate and may reflect a lipomatous lesion or artifact from adjacent bowel. Recommend dedicated evaluation with contrast enhanced CT to rule out possibility of mass lesion.     Minna Antis Winfred, [place]   CT Scan - Renal 07/21/23 EXAM: CT RENAL MASS (RENAL MASS PROTOCOL) ACCESSION: [id number] UN   CLINICAL INDICATION: follow up MRI finding - N28.89 - Renal mass  FINDINGS:  LOWER CHEST: Left lower lobe pulmonary nodules measuring up to 0.3 cm (11:17, 11:14).  LIVER: Normal liver contour. No focal liver lesions.  BILIARY: Gallbladder is surgically absent. Common bile duct dilation measuring up to 0.8 cm (13:39), likely secondary to postcholecystectomy state.  SPLEEN: Subcentimeter splenic hypodensity (11:19), too small to further characterize by CT.  PANCREAS: Normal pancreatic contour. No focal lesions. No ductal dilation.  ADRENAL GLANDS: Normal appearance of the adrenal glands.  KIDNEYS: Right Kidney: Right kidney exophytic interpolar anterior heterogeneous enhancing fat-containing mass without calcifications measuring 9.4 x 7.2 x 7.2 cm (11:54, 13:41). No obstructing calculi. No hydronephrosis. Left Kidney: Homogeneous enhancement. No obstructing calculi. No hydronephrosis. No suspicious enhancing mass.  GI TRACT: No findings of bowel obstruction or acute inflammation.  PERITONEUM, RETROPERITONEUM AND MESENTERY: No free air. No ascites. No fluid collection.  LYMPH NODES: No adenopathy.  VESSELS: Hepatic and portal veins are patent. Normal caliber aorta.  BONES and SOFT TISSUES: No aggressive osseous lesions. Small fat-containing umbilical hernia.  IMPRESSION: -Fat-containing right renal lesion measuring up to 9.4 cm with imaging characteristics most consistent with a benign angiomyolipoma. However, given size, lesion carries risk for spontaneous bleeding and retroperitoneal hemorrhage.  --Left  basilar pulmonary micronodules. If patient is at increased risk for lung cancer, consider follow-up chest CT without contrast in 12 months.     Red flags: Negative for bowel/bladder changes, saddle paresthesia, personal history of cancer, h/o spinal tumors, h/o compression fx, h/o abdominal aneurysm, abdominal pain, chills/fever, night sweats, nausea, vomiting, unrelenting pain, first onset of insidious LBP <20 y/o  -Difficulty with urinary frequency/urgency, urinary incontinence   Living Environment Lives with: lives with their spouse; husband travels for work; pt is at home alone Tues/Wed/Thur Lives in: House/apartment; 2-level home, Oceanographer bedroom Stairs: Yes: Internal: 13 steps; on left going up and External: 3 steps; can reach both handrails; 4 steps at backdoor Has following equipment at home: None  Prior level of function: Independent  Occupational demands: Retired  Presenter, broadcasting: Social research officer, government, cooking, walking routine  Patient Goals: Get back to normal activities    OBJECTIVE:   GAIT: Distance walked: 50 ft Assistive device utilized: None Level of assistance: Complete Independence Comments: Decreased arm swing/trunk rotation, slow velocity and step cadence  Posture: Lumbar lordosis: WNL Mild rounded shoulders  Iliac crest height: Equal bilaterally Lumbar lateral shift: Negative Guarded posture, pt maintains pressure through hands in sitting  AROM AROM (Normal range in degrees) AROM  07/22/23  Lumbar   Flexion (65) 50%* (pain at waist L>R)  Extension (30) 25%* (numbness into toes)  Right lateral flexion (25) 75%*  Left lateral  flexion (25) 75% (mild symptoms)  Right rotation (30) 25%*  Left rotation (30) 25%      Hip Right Left  Flexion (125) 120* 120*  Extension (15)    Abduction (40)    Adduction     Internal Rotation (45) 30 20*  External Rotation (45) 45 45*      (* = pain; Blank rows = not tested)  LE MMT: MMT (out of 5) Right 07/22/23  Left 07/22/23  Hip flexion 4* 3+*  Hip extension    Hip abduction 4- 4-  Hip adduction    Hip internal rotation    Hip external rotation    Knee flexion 5 4-*  Knee extension 5 3+*  Ankle dorsiflexion 5 3+*  Ankle plantarflexion    Ankle inversion    Ankle eversion    (* = pain; Blank rows = not tested)  Sensation Grossly intact to light touch throughout bilateral LEs as determined by testing dermatomes L2-S2. Proprioception, stereognosis, and hot/cold testing deferred on this date.  Reflexes R/L Knee Jerk (L3/4): 2+/2+  Ankle Jerk (S1/2): 2+/1+   Muscle Length Hamstrings: R: Positive L: Positive E[place] (quadriceps): R: Not examined L: Not examined Thomas (hip flexors): R: Not examined L: Not examined   Palpation Location Right Left         Lumbar paraspinals 1 1  Quadratus Lumborum    Gluteus Maximus 1 1  Gluteus Medius  1  Deep hip external rotators  0  PSIS  1  Fortin's Area (SIJ)    Greater Trochanter    (Blank rows = not tested) Graded on 0-4 scale (0 = no pain, 1 = pain, 2 = pain with wincing/grimacing/flinching, 3 = pain with withdrawal, 4 = unwilling to allow palpation)  Passive Accessory Intervertebral Motion Deferred  Special Tests Lumbar Radiculopathy and Discogenic: Centralization and Peripheralization (SN 92, -LR 0.12): Repeated flexion in lying; pressure in back during, no worse after Slump (SN 83, -LR 0.32): R: Positive L: Positive SLR (SN 92, -LR 0.29): R: Positive L:  Positive   Lumbar Foraminal Stenosis: Lumbar quadrant (SN 70): R: Negative L: Positive     TODAY'S TREATMENT: DATE: 07/30/2023   SUBJECTIVE STATEMENT:   Pt reports "barely making it" in regard to negotiating her home e.g. using stairs to get up to master bedroom and to enter her home. Patient reports she is okay doing her exercises. Pt reports difficulty with standing for any period of time. Patient reports most challenge at home related to getting up/getting down from floor.  Pt reports pain across back. She reports heaviness in L leg. Patient reports tingling intermittently as far as toes. Patient reports paresthesias are not as far down LE presently. 9/10 pain at arrival to PT.     Manual Therapy - for symptom modulation, soft tissue sensitivity and mobility, joint mobility, ROM    Manual lumbar traction in supine with Mulligan belt; 10 sec on, 10 sec off; x 10 minutes for nerve root decompression and symptom modulation    Therapeutic Exercise - for improved soft tissue flexibility and extensibility as needed for ROM, dural/neural mobility as needed to reduce neuralgias/paresthesias,   Double knee to chest; 1x10, 1 sec Double knee to chest, with clinician overpressure; 2x10, 1 sec Lower trunk rotations; 1x10 alternating R/L Posterior pelvic tilt; 2x10   Seated blue physioball rollout; 3-way (anterior and anterolateral R/L);  x 6 ea dir    PATIENT EDUCATION: HEP update and review. Discussed holding on repeated movement if  experiencing heightened leg pain.    MHP (unbilled) utilized post-treatment for analgesic effect and improved soft tissue extensibility, along low back with pt sitting in chair with upright lumbar support; x  5 minutes.    *not today* Sciatic nerve flossing, hooklying; 2x10, 1 sec hold    PATIENT EDUCATION:  Education details: see above for patient education details Person educated: Patient Education method: Explanation, Demonstration, and Handouts Education comprehension: verbalized understanding and returned demonstration   HOME EXERCISE PROGRAM:  Access Code: [email or web address] URL: [email or web address] Date: 07/28/2023 Prepared by: Consuela Mimes  Exercises - Hooklying Lumbar Traction  - 3 x daily - 7 x weekly - 2 sets - 10 reps - Supine Double Knee to Chest  - 5-6 x daily - 7 x weekly - 1 sets - 10 reps - 1sec hold - Supine Lower Trunk Rotation  - 2 x daily - 7 x weekly - 2 sets - 10 reps - Supine 90/90  Sciatic Nerve Glide with Knee Flexion/Extension  - 2 x daily - 7 x weekly - 2 sets - 10 reps - 1-2sec hold   ASSESSMENT:  CLINICAL IMPRESSION: Patient has been debilitated by ongoing lower quarter pain and paresthesias with limited ability to perform walking - symptoms are partly controlled with Gabapentin, but pt still has substantial activity limitations and notable pain behaviors with transferring and bed mobility tasks. We are continuing with focus on flexion principle MDT management and flexion-bias exercise. Pt responds well in short-term with flexion, but symptoms re-occur rapidly with weightbearing activity. We progressed to repeated flexion with clinician overpressure with improved symptoms after completion of movement. We reiterated today moving into end-range with each rep of repeated flexion in lying as tolerated. Pt has remaining deficits in thoracolumbar AROM, posterior chain soft tissue mobility/dural and neural mobility, decreased hip and LLE strength, and sensitivity to palpation along L>R lumbar paraspinals and gluteal musculature. Pt will continue to benefit from skilled PT services to address deficits and improve function.   OBJECTIVE IMPAIRMENTS: decreased activity tolerance, difficulty walking, decreased ROM, decreased strength, hypomobility, impaired flexibility, and pain.   ACTIVITY LIMITATIONS: standing, squatting, stairs, transfers, bed mobility, and locomotion level  PARTICIPATION LIMITATIONS: meal prep, cleaning, driving, shopping, and community activity  PERSONAL FACTORS: Age, Past/current experiences, and 3+ comorbidities: (depression, HTN, GERD, anxiety)  are also affecting patient's functional outcome.   REHAB POTENTIAL: Good  CLINICAL DECISION MAKING: Evolving/moderate complexity  EVALUATION COMPLEXITY: Moderate   GOALS: Goals reviewed with patient? Yes  SHORT TERM GOALS: Target date: 08/13/2023  Pt will be independent with HEP in order to improve  strength and decrease back pain to improve pain-free function at home and work. Baseline: 07/22/23: Baseline HEP initiated.  Goal status: INITIAL   LONG TERM GOALS: Target date: 09/03/2023  Pt will increase FOTO to at least 50 to demonstrate significant improvement in function at home and work related to back pain  Baseline: 07/22/23: 25 Goal status: INITIAL  2.  Pt will decrease worst back pain by at least 2 points on the NPRS in order to demonstrate clinically significant reduction in back pain. Baseline: 07/22/23: 8-9/10 Goal status: INITIAL  3.  Patient will have full thoracolumbar AROM without reproduction of pain as needed for reaching items on ground, household chores, bending.     Baseline: 07/22/23: Pain and motion loss in all directions Goal status: INITIAL  4.  Pt will demonstrate negotiation of full flight of steps with reciprocal pattern and no movement deviations and no increase in lower quarter  pain as needed for community-level mobility  Baseline: 07/22/23: Significant limitation with stair negotiation Goal status: INITIAL  5.  Pt will perform sit to stand with no UE assist and no reproduction of pain as needed for self-care and home-level mobility  Baseline: 07/22/23: Significant pain behaviors and Gower's sign when getting up from seat Goal status: INITIAL   PLAN: PT FREQUENCY: 1-2x/week  PT DURATION: 6 weeks  PLANNED INTERVENTIONS: Therapeutic exercises, Therapeutic activity, Neuromuscular re-education, Balance training, Gait training, Patient/Family education, Self Care, Joint mobilization, Joint manipulation, Vestibular training, Canalith repositioning, Orthotic/Fit training, DME instructions, Dry Needling, Electrical stimulation, Spinal manipulation, Spinal mobilization, Cryotherapy, Moist heat, Taping, Traction, Ultrasound, Ionotophoresis 4mg /ml Dexamethasone, Manual therapy, and Re-evaluation.  PLAN FOR NEXT SESSION: Manual lumbar traction, repeated flexion/flexion  principle MDT, posterior chain mobility/nerve flossing, manual techniques for soft tissue sensitivity prn.    Consuela Mimes, PT, DPT #[id number]  Gertie Exon, PT 07/30/2023, 2:16 PM

## 2023-08-04 ENCOUNTER — Encounter: Payer: Self-pay | Admitting: Physical Therapy

## 2023-08-04 ENCOUNTER — Ambulatory Visit: Payer: Managed Care, Other (non HMO) | Admitting: Physical Therapy

## 2023-08-04 DIAGNOSIS — M6281 Muscle weakness (generalized): Secondary | ICD-10-CM

## 2023-08-04 DIAGNOSIS — M5459 Other low back pain: Secondary | ICD-10-CM

## 2023-08-04 DIAGNOSIS — M5416 Radiculopathy, lumbar region: Secondary | ICD-10-CM

## 2023-08-04 NOTE — Therapy (Signed)
OUTPATIENT PHYSICAL THERAPY TREATMENT   Patient Name: [name] MRN: [id number] DOB:04/15/1951, 72 y.o., female Today's Date: 08/04/2023   END OF SESSION:  PT End of Session - 08/04/23 1259     Visit Number 4    Number of Visits 13    Date for PT Re-Evaluation 09/03/23    Authorization Type [employer]/Medicare [id number]    PT Start Time 1301    PT Stop Time 1350    PT Time Calculation (min) 49 min    Activity Tolerance Patient limited by pain    Behavior During Therapy [facility] for tasks assessed/performed                 Past Medical History:  Diagnosis Date   Actinic keratosis    Allergy    Anxiety    Breast mass 10/07/2016   left 2:00   Depression    GERD (gastroesophageal reflux disease)    Hypertension    Past Surgical History:  Procedure Laterality Date   ABDOMINAL HYSTERECTOMY  1988   complete; due to fibroids   BLADDER REPAIR  2007   BREAST BIOPSY Left 11/25/2022   [place] Axilla Bx, Path pending   CHOLECYSTECTOMY  2003   COLONOSCOPY  04/16/2009   KNEE SURGERY Left 2012   TONSILLECTOMY  1960   Patient Active Problem List   Diagnosis Date Noted   Annual physical exam 05/30/2022   Depression, recurrent (HCC) 05/30/2022   Primary hypertension 05/30/2022   Screening mammogram for breast cancer 05/30/2022   Gastroesophageal reflux disease without esophagitis 07/18/2021    PCP: Jacky Kindle, FNP  REFERRING PROVIDER: Lazarus Gowda*  REFERRING DIAG:  M54.16 (ICD-10-CM) - Radiculopathy, lumbar region    RATIONALE FOR EVALUATION AND TREATMENT: Rehabilitation  THERAPY DIAG: Other low back pain  Radiculopathy, lumbar region  Muscle weakness (generalized)  ONSET DATE: Mid-October 2024;   FOLLOW-UP APPT SCHEDULED WITH REFERRING PROVIDER: Yes    PERTINENT HISTORY: [name] with Hx of low back/R hip pain, no radiographic explanation of hip pain; current referral for lumbar radiculopathy. Pt reports Hx of bone spur in R  hip that doesn't typically bother her that much; she had injection in it last year. Pt reports pain across her back and down her LLE; numbness down L leg down to L foot. Pt reports feeling unbalanced and unstable on her feet. Pt reports she used to walk up to 10,000 steps/day; she reports she has not done this lately. Symptoms began mid-October when she was bathing her dog in shower; she was bent over bathing dog and felt notable pain upon standing afterward. Pt is using Gabapentin for neuralgia management; OxyContin for severe pain episodes about 3 times total now. Pt reports swelling in ankles after prolonged standing, no history of peripheral vascular disease.   PAIN:    Pain Intensity: Present: 5-6/10, Worst: 8-9/10 Pain location: Across low back, pain down LLE, heaviness into L thigh Pain Quality: sharp and dull ; "numbing and tingling" Radiating: Yes, down LLE Numbness/Tingling: Yes; down LLE to toes, into great toe especially  Focal Weakness: Yes; LLE weakness, intermittent difficulty weightbearing Aggravating factors: standing "any length of time," sit to stand after prolonged sitting, turning over in bed, transferring from car, up/down stairs  Relieving factors: Gabapentin, heating pad, TENS, stretching, heat 24-hour pain behavior: Worse in PM previously; no 24-hour pain pattern now  How long can you stand: 30 minutes standing in kitchen tolerated; then, she has to lie down/get off  feet  History of prior back injury, pain, surgery, or therapy: Yes; Hx of chiropractic for this episode of pain initially   Imaging: Yes ; Lin Landsman, Karie Fetch, MD   EXAM: Magnetic resonance imaging, spinal canal and contents, lumbar, without contrast material. DATE: 07/06/2023 11:24 AM ACCESSION: [id number] UN DICTATED: 07/06/2023 11:37 AM   --Multilevel degenerative changes with varying degrees of spinal canal and neuroforaminal stenosis, most pronounced at L4-L5 and L5-S1.  --Prominent  masslike signal abnormality along middle/lower pole of right kidney, indeterminate and may reflect a lipomatous lesion or artifact from adjacent bowel. Recommend dedicated evaluation with contrast enhanced CT to rule out possibility of mass lesion.     Minna Antis [place], [place]   CT Scan - Renal 07/21/23 EXAM: CT RENAL MASS (RENAL MASS PROTOCOL) ACCESSION: [id number] UN   CLINICAL INDICATION: follow up MRI finding - N28.89 - Renal mass  FINDINGS:  LOWER CHEST: Left lower lobe pulmonary nodules measuring up to 0.3 cm (11:17, 11:14).  LIVER: Normal liver contour. No focal liver lesions.  BILIARY: Gallbladder is surgically absent. Common bile duct dilation measuring up to 0.8 cm (13:39), likely secondary to postcholecystectomy state.  SPLEEN: Subcentimeter splenic hypodensity (11:19), too small to further characterize by CT.  PANCREAS: Normal pancreatic contour. No focal lesions. No ductal dilation.  ADRENAL GLANDS: Normal appearance of the adrenal glands.  KIDNEYS: Right Kidney: Right kidney exophytic interpolar anterior heterogeneous enhancing fat-containing mass without calcifications measuring 9.4 x 7.2 x 7.2 cm (11:54, 13:41). No obstructing calculi. No hydronephrosis. Left Kidney: Homogeneous enhancement. No obstructing calculi. No hydronephrosis. No suspicious enhancing mass.  GI TRACT: No findings of bowel obstruction or acute inflammation.  PERITONEUM, RETROPERITONEUM AND MESENTERY: No free air. No ascites. No fluid collection.  LYMPH NODES: No adenopathy.  VESSELS: Hepatic and portal veins are patent. Normal caliber aorta.  BONES and SOFT TISSUES: No aggressive osseous lesions. Small fat-containing umbilical hernia.  IMPRESSION: -Fat-containing right renal lesion measuring up to 9.4 cm with imaging characteristics most consistent with a benign angiomyolipoma. However, given size, lesion carries risk for spontaneous bleeding and retroperitoneal hemorrhage.  --Left  basilar pulmonary micronodules. If patient is at increased risk for lung cancer, consider follow-up chest CT without contrast in 12 months.     Red flags: Negative for bowel/bladder changes, saddle paresthesia, personal history of cancer, h/o spinal tumors, h/o compression fx, h/o abdominal aneurysm, abdominal pain, chills/fever, night sweats, nausea, vomiting, unrelenting pain, first onset of insidious LBP <20 y/o  -Difficulty with urinary frequency/urgency, urinary incontinence   Living Environment Lives with: lives with their spouse; husband travels for work; pt is at home alone Tues/Wed/Thur Lives in: House/apartment; 2-level home, Oceanographer bedroom Stairs: Yes: Internal: 13 steps; on left going up and External: 3 steps; can reach both handrails; 4 steps at backdoor Has following equipment at home: None  Prior level of function: Independent  Occupational demands: Retired  Presenter, broadcasting: Social research officer, government, cooking, walking routine  Patient Goals: Get back to normal activities    OBJECTIVE:   GAIT: Distance walked: 50 ft Assistive device utilized: None Level of assistance: Complete Independence Comments: Decreased arm swing/trunk rotation, slow velocity and step cadence  Posture: Lumbar lordosis: WNL Mild rounded shoulders  Iliac crest height: Equal bilaterally Lumbar lateral shift: Negative Guarded posture, pt maintains pressure through hands in sitting  AROM AROM (Normal range in degrees) AROM  07/22/23  Lumbar   Flexion (65) 50%* (pain at waist L>R)  Extension (30) 25%* (numbness into toes)  Right lateral flexion (25) 75%*  Left lateral flexion (25) 75% (mild symptoms)  Right rotation (30) 25%*  Left rotation (30) 25%      Hip Right Left  Flexion (125) 120* 120*  Extension (15)    Abduction (40)    Adduction     Internal Rotation (45) 30 20*  External Rotation (45) 45 45*      (* = pain; Blank rows = not tested)  LE MMT: MMT (out of 5) Right 07/22/23  Left 07/22/23  Hip flexion 4* 3+*  Hip extension    Hip abduction 4- 4-  Hip adduction    Hip internal rotation    Hip external rotation    Knee flexion 5 4-*  Knee extension 5 3+*  Ankle dorsiflexion 5 3+*  Ankle plantarflexion    Ankle inversion    Ankle eversion    (* = pain; Blank rows = not tested)  Sensation Grossly intact to light touch throughout bilateral LEs as determined by testing dermatomes L2-S2. Proprioception, stereognosis, and hot/cold testing deferred on this date.  Reflexes R/L Knee Jerk (L3/4): 2+/2+  Ankle Jerk (S1/2): 2+/1+   Muscle Length Hamstrings: R: Positive L: Positive E[place] (quadriceps): R: Not examined L: Not examined Thomas (hip flexors): R: Not examined L: Not examined   Palpation Location Right Left         Lumbar paraspinals 1 1  Quadratus Lumborum    Gluteus Maximus 1 1  Gluteus Medius  1  Deep hip external rotators  0  PSIS  1  Fortin's Area (SIJ)    Greater Trochanter    (Blank rows = not tested) Graded on 0-4 scale (0 = no pain, 1 = pain, 2 = pain with wincing/grimacing/flinching, 3 = pain with withdrawal, 4 = unwilling to allow palpation)  Passive Accessory Intervertebral Motion Deferred  Special Tests Lumbar Radiculopathy and Discogenic: Centralization and Peripheralization (SN 92, -LR 0.12): Repeated flexion in lying; pressure in back during, no worse after Slump (SN 83, -LR 0.32): R: Positive L: Positive SLR (SN 92, -LR 0.29): R: Positive L:  Positive   Lumbar Foraminal Stenosis: Lumbar quadrant (SN 70): R: Negative L: Positive     TODAY'S TREATMENT: DATE: 08/04/2023   SUBJECTIVE STATEMENT:   Pt reports notable pressure across waist at the time. She reports pain along L flank primarily. Pt reports LLE feels "different" versus R side. Patient reports difficulty with negotiating steps this AM. She reports that presently, her pain rating scale is at 6-7/10 NPRS. Pt feels that she is somewhat better generally; she  states she is better than she was yesterday.     Manual Therapy - for symptom modulation, soft tissue sensitivity and mobility, joint mobility, ROM   In R sidelying: L L4-S1 segmental distraction distraction; 2-3 sec bouts x 20 repetitions STM and IASTM with [employer] along L L3-S1 longissimus lumborum, L QL, L gluteal musculature; x 8 minutes  Manual lumbar traction in supine with Mulligan belt; 10 sec on, 10 sec off; x 8 minutes for nerve root decompression and symptom modulation    Therapeutic Exercise - for improved soft tissue flexibility and extensibility as needed for ROM, dural/neural mobility as needed to reduce neuralgias/paresthesias,    Double knee to chest, with clinician overpressure; 2x10, 1 sec   -fleeting increase in pressure along L paraspinal region; better after  Lower trunk rotations; 1x10 alternating R/L Posterior pelvic tilt; 2x10  Seated blue physioball rollout; 3-way (anterior and anterolateral R/L);  x 5 ea dir    PATIENT EDUCATION:  HEP reviewed. Discussed expectation for relief of peripheral symptoms initially. We discussed stopping current repeated movement program if experiencing persistent worsening of leg symptoms.   MHP (unbilled) utilized post-treatment for analgesic effect and improved soft tissue extensibility, along low back with pt sitting in chair with upright lumbar support; x  5 minutes.    *not today* Double knee to chest; 1x10, 1 sec Sciatic nerve flossing, hooklying; 2x10, 1 sec hold     PATIENT EDUCATION:  Education details: see above for patient education details Person educated: Patient Education method: Explanation, Demonstration, and Handouts Education comprehension: verbalized understanding and returned demonstration   HOME EXERCISE PROGRAM:  Access Code: [email or web address] URL: [email or web address] Date: 07/28/2023 Prepared by: Consuela Mimes  Exercises - Hooklying Lumbar Traction  - 3 x daily - 7 x weekly - 2  sets - 10 reps - Supine Double Knee to Chest  - 5-6 x daily - 7 x weekly - 1 sets - 10 reps - 1sec hold - Supine Lower Trunk Rotation  - 2 x daily - 7 x weekly - 2 sets - 10 reps - Supine 90/90 Sciatic Nerve Glide with Knee Flexion/Extension  - 2 x daily - 7 x weekly - 2 sets - 10 reps - 1-2sec hold   ASSESSMENT:  CLINICAL IMPRESSION: Patient appears to be tolerating bed mobility (scooting, sitting to supine transfers) and sit to stand better, though there is still mild grimacing; she is able to more readily perform these activities with some degree of pain. Pt does have mitigation of leg symptoms with repeated flexion. L flank symptoms persist through manual techniques performed today as well as exercise afterward. We used MHP post-session for residual L lower quarter pain. Pt fortunately does feel she is making modest/gradual progress with PT, though she is still functionally limited. Pt has remaining deficits in thoracolumbar AROM, posterior chain soft tissue mobility/dural and neural mobility, decreased hip and LLE strength, and sensitivity to palpation along L>R lumbar paraspinals and gluteal musculature. Pt will continue to benefit from skilled PT services to address deficits and improve function.   OBJECTIVE IMPAIRMENTS: decreased activity tolerance, difficulty walking, decreased ROM, decreased strength, hypomobility, impaired flexibility, and pain.   ACTIVITY LIMITATIONS: standing, squatting, stairs, transfers, bed mobility, and locomotion level  PARTICIPATION LIMITATIONS: meal prep, cleaning, driving, shopping, and community activity  PERSONAL FACTORS: Age, Past/current experiences, and 3+ comorbidities: (depression, HTN, GERD, anxiety)  are also affecting patient's functional outcome.   REHAB POTENTIAL: Good  CLINICAL DECISION MAKING: Evolving/moderate complexity  EVALUATION COMPLEXITY: Moderate   GOALS: Goals reviewed with patient? Yes  SHORT TERM GOALS: Target date:  08/13/2023  Pt will be independent with HEP in order to improve strength and decrease back pain to improve pain-free function at home and work. Baseline: 07/22/23: Baseline HEP initiated.  Goal status: INITIAL   LONG TERM GOALS: Target date: 09/03/2023  Pt will increase FOTO to at least 50 to demonstrate significant improvement in function at home and work related to back pain  Baseline: 07/22/23: 25 Goal status: INITIAL  2.  Pt will decrease worst back pain by at least 2 points on the NPRS in order to demonstrate clinically significant reduction in back pain. Baseline: 07/22/23: 8-9/10 Goal status: INITIAL  3.  Patient will have full thoracolumbar AROM without reproduction of pain as needed for reaching items on ground, household chores, bending.     Baseline: 07/22/23: Pain and motion loss in all directions Goal status: INITIAL  4.  Pt will demonstrate negotiation of full  flight of steps with reciprocal pattern and no movement deviations and no increase in lower quarter pain as needed for community-level mobility  Baseline: 07/22/23: Significant limitation with stair negotiation Goal status: INITIAL  5.  Pt will perform sit to stand with no UE assist and no reproduction of pain as needed for self-care and home-level mobility  Baseline: 07/22/23: Significant pain behaviors and Gower's sign when getting up from seat Goal status: INITIAL   PLAN: PT FREQUENCY: 1-2x/week  PT DURATION: 6 weeks  PLANNED INTERVENTIONS: Therapeutic exercises, Therapeutic activity, Neuromuscular re-education, Balance training, Gait training, Patient/Family education, Self Care, Joint mobilization, Joint manipulation, Vestibular training, Canalith repositioning, Orthotic/Fit training, DME instructions, Dry Needling, Electrical stimulation, Spinal manipulation, Spinal mobilization, Cryotherapy, Moist heat, Taping, Traction, Ultrasound, Ionotophoresis 4mg /ml Dexamethasone, Manual therapy, and Re-evaluation.  PLAN  FOR NEXT SESSION: Manual lumbar traction, repeated flexion/flexion principle MDT, posterior chain mobility/nerve flossing, manual techniques for soft tissue sensitivity prn.    Consuela Mimes, PT, DPT #[id number]  Gertie Exon, PT 08/04/2023, 3:56 PM

## 2023-08-06 ENCOUNTER — Ambulatory Visit (INDEPENDENT_AMBULATORY_CARE_PROVIDER_SITE_OTHER): Payer: Managed Care, Other (non HMO) | Admitting: Urology

## 2023-08-06 ENCOUNTER — Ambulatory Visit: Payer: Managed Care, Other (non HMO) | Admitting: Physical Therapy

## 2023-08-06 ENCOUNTER — Encounter: Payer: Self-pay | Admitting: Urology

## 2023-08-06 VITALS — BP 126/76 | HR 69 | Ht 62.0 in | Wt 155.0 lb

## 2023-08-06 DIAGNOSIS — N2889 Other specified disorders of kidney and ureter: Secondary | ICD-10-CM | POA: Diagnosis not present

## 2023-08-06 DIAGNOSIS — D1771 Benign lipomatous neoplasm of kidney: Secondary | ICD-10-CM

## 2023-08-06 LAB — MICROSCOPIC EXAMINATION

## 2023-08-06 LAB — URINALYSIS, COMPLETE
Bilirubin, UA: NEGATIVE
Glucose, UA: NEGATIVE
Leukocytes,UA: NEGATIVE
Nitrite, UA: NEGATIVE
RBC, UA: NEGATIVE
Specific Gravity, UA: 1.025 (ref 1.005–1.030)
Urobilinogen, Ur: 1 mg/dL (ref 0.2–1.0)
pH, UA: 5.5 (ref 5.0–7.5)

## 2023-08-06 NOTE — Therapy (Deleted)
OUTPATIENT PHYSICAL THERAPY TREATMENT   Patient Name: [name] MRN: [id number] DOB:06-20-1951, 72 y.o., female Today's Date: 08/06/2023   END OF SESSION:        Past Medical History:  Diagnosis Date   Actinic keratosis    Allergy    Anxiety    Breast mass 10/07/2016   left 2:00   Depression    GERD (gastroesophageal reflux disease)    Hypertension    Past Surgical History:  Procedure Laterality Date   ABDOMINAL HYSTERECTOMY  1988   complete; due to fibroids   BLADDER REPAIR  2007   BREAST BIOPSY Left 11/25/2022   [place] Axilla Bx, Path pending   CHOLECYSTECTOMY  2003   COLONOSCOPY  04/16/2009   KNEE SURGERY Left 2012   TONSILLECTOMY  1960   Patient Active Problem List   Diagnosis Date Noted   Annual physical exam 05/30/2022   Depression, recurrent (HCC) 05/30/2022   Primary hypertension 05/30/2022   Screening mammogram for breast cancer 05/30/2022   Gastroesophageal reflux disease without esophagitis 07/18/2021    PCP: Jacky Kindle, FNP  REFERRING PROVIDER: Lazarus Gowda*  REFERRING DIAG:  M54.16 (ICD-10-CM) - Radiculopathy, lumbar region    RATIONALE FOR EVALUATION AND TREATMENT: Rehabilitation  THERAPY DIAG: Other low back pain  Radiculopathy, lumbar region  Muscle weakness (generalized)  ONSET DATE: Mid-October 2024;   FOLLOW-UP APPT SCHEDULED WITH REFERRING PROVIDER: Yes    PERTINENT HISTORY: [name] with Hx of low back/R hip pain, no radiographic explanation of hip pain; current referral for lumbar radiculopathy. Pt reports Hx of bone spur in R hip that doesn't typically bother her that much; she had injection in it last year. Pt reports pain across her back and down her LLE; numbness down L leg down to L foot. Pt reports feeling unbalanced and unstable on her feet. Pt reports she used to walk up to 10,000 steps/day; she reports she has not done this lately. Symptoms began mid-October when she was  bathing her dog in shower; she was bent over bathing dog and felt notable pain upon standing afterward. Pt is using Gabapentin for neuralgia management; OxyContin for severe pain episodes about 3 times total now. Pt reports swelling in ankles after prolonged standing, no history of peripheral vascular disease.   PAIN:    Pain Intensity: Present: 5-6/10, Worst: 8-9/10 Pain location: Across low back, pain down LLE, heaviness into L thigh Pain Quality: sharp and dull ; "numbing and tingling" Radiating: Yes, down LLE Numbness/Tingling: Yes; down LLE to toes, into great toe especially  Focal Weakness: Yes; LLE weakness, intermittent difficulty weightbearing Aggravating factors: standing "any length of time," sit to stand after prolonged sitting, turning over in bed, transferring from car, up/down stairs  Relieving factors: Gabapentin, heating pad, TENS, stretching, heat 24-hour pain behavior: Worse in PM previously; no 24-hour pain pattern now  How long can you stand: 30 minutes standing in kitchen tolerated; then, she has to lie down/get off feet  History of prior back injury, pain, surgery, or therapy: Yes; Hx of chiropractic for this episode of pain initially   Imaging: Yes ; Lin Landsman, Karie Fetch, MD   EXAM: Magnetic resonance imaging, spinal canal and contents, lumbar, without contrast material. DATE: 07/06/2023 11:24 AM ACCESSION: [id number] UN DICTATED: 07/06/2023 11:37 AM   --Multilevel degenerative changes with varying degrees of spinal canal and neuroforaminal stenosis, most pronounced at L4-L5 and L5-S1.  --Prominent masslike signal abnormality along middle/lower pole of right kidney, indeterminate and  may reflect a lipomatous lesion or artifact from adjacent bowel. Recommend dedicated evaluation with contrast enhanced CT to rule out possibility of mass lesion.     Minna Antis [place], [place]   CT Scan - Renal 07/21/23 EXAM: CT RENAL MASS (RENAL MASS  PROTOCOL) ACCESSION: [id number] UN   CLINICAL INDICATION: follow up MRI finding - N28.89 - Renal mass  FINDINGS:  LOWER CHEST: Left lower lobe pulmonary nodules measuring up to 0.3 cm (11:17, 11:14).  LIVER: Normal liver contour. No focal liver lesions.  BILIARY: Gallbladder is surgically absent. Common bile duct dilation measuring up to 0.8 cm (13:39), likely secondary to postcholecystectomy state.  SPLEEN: Subcentimeter splenic hypodensity (11:19), too small to further characterize by CT.  PANCREAS: Normal pancreatic contour. No focal lesions. No ductal dilation.  ADRENAL GLANDS: Normal appearance of the adrenal glands.  KIDNEYS: Right Kidney: Right kidney exophytic interpolar anterior heterogeneous enhancing fat-containing mass without calcifications measuring 9.4 x 7.2 x 7.2 cm (11:54, 13:41). No obstructing calculi. No hydronephrosis. Left Kidney: Homogeneous enhancement. No obstructing calculi. No hydronephrosis. No suspicious enhancing mass.  GI TRACT: No findings of bowel obstruction or acute inflammation.  PERITONEUM, RETROPERITONEUM AND MESENTERY: No free air. No ascites. No fluid collection.  LYMPH NODES: No adenopathy.  VESSELS: Hepatic and portal veins are patent. Normal caliber aorta.  BONES and SOFT TISSUES: No aggressive osseous lesions. Small fat-containing umbilical hernia.  IMPRESSION: -Fat-containing right renal lesion measuring up to 9.4 cm with imaging characteristics most consistent with a benign angiomyolipoma. However, given size, lesion carries risk for spontaneous bleeding and retroperitoneal hemorrhage.  --Left basilar pulmonary micronodules. If patient is at increased risk for lung cancer, consider follow-up chest CT without contrast in 12 months.     Red flags: Negative for bowel/bladder changes, saddle paresthesia, personal history of cancer, h/o spinal tumors, h/o compression fx, h/o abdominal aneurysm, abdominal pain, chills/fever, night  sweats, nausea, vomiting, unrelenting pain, first onset of insidious LBP <20 y/o  -Difficulty with urinary frequency/urgency, urinary incontinence   Living Environment Lives with: lives with their spouse; husband travels for work; pt is at home alone Tues/Wed/Thur Lives in: House/apartment; 2-level home, Oceanographer bedroom Stairs: Yes: Internal: 13 steps; on left going up and External: 3 steps; can reach both handrails; 4 steps at backdoor Has following equipment at home: None  Prior level of function: Independent  Occupational demands: Retired  Presenter, broadcasting: Social research officer, government, cooking, walking routine  Patient Goals: Get back to normal activities    OBJECTIVE:   GAIT: Distance walked: 50 ft Assistive device utilized: None Level of assistance: Complete Independence Comments: Decreased arm swing/trunk rotation, slow velocity and step cadence  Posture: Lumbar lordosis: WNL Mild rounded shoulders  Iliac crest height: Equal bilaterally Lumbar lateral shift: Negative Guarded posture, pt maintains pressure through hands in sitting  AROM AROM (Normal range in degrees) AROM  07/22/23  Lumbar   Flexion (65) 50%* (pain at waist L>R)  Extension (30) 25%* (numbness into toes)  Right lateral flexion (25) 75%*  Left lateral flexion (25) 75% (mild symptoms)  Right rotation (30) 25%*  Left rotation (30) 25%      Hip Right Left  Flexion (125) 120* 120*  Extension (15)    Abduction (40)    Adduction     Internal Rotation (45) 30 20*  External Rotation (45) 45 45*      (* = pain; Blank rows = not tested)  LE MMT: MMT (out of 5) Right 07/22/23 Left 07/22/23  Hip flexion 4* 3+*  Hip  extension    Hip abduction 4- 4-  Hip adduction    Hip internal rotation    Hip external rotation    Knee flexion 5 4-*  Knee extension 5 3+*  Ankle dorsiflexion 5 3+*  Ankle plantarflexion    Ankle inversion    Ankle eversion    (* = pain; Blank rows = not tested)  Sensation Grossly intact to  light touch throughout bilateral LEs as determined by testing dermatomes L2-S2. Proprioception, stereognosis, and hot/cold testing deferred on this date.  Reflexes R/L Knee Jerk (L3/4): 2+/2+  Ankle Jerk (S1/2): 2+/1+   Muscle Length Hamstrings: R: Positive L: Positive E[place] (quadriceps): R: Not examined L: Not examined Thomas (hip flexors): R: Not examined L: Not examined   Palpation Location Right Left         Lumbar paraspinals 1 1  Quadratus Lumborum    Gluteus Maximus 1 1  Gluteus Medius  1  Deep hip external rotators  0  PSIS  1  Fortin's Area (SIJ)    Greater Trochanter    (Blank rows = not tested) Graded on 0-4 scale (0 = no pain, 1 = pain, 2 = pain with wincing/grimacing/flinching, 3 = pain with withdrawal, 4 = unwilling to allow palpation)  Passive Accessory Intervertebral Motion Deferred  Special Tests Lumbar Radiculopathy and Discogenic: Centralization and Peripheralization (SN 92, -LR 0.12): Repeated flexion in lying; pressure in back during, no worse after Slump (SN 83, -LR 0.32): R: Positive L: Positive SLR (SN 92, -LR 0.29): R: Positive L:  Positive   Lumbar Foraminal Stenosis: Lumbar quadrant (SN 70): R: Negative L: Positive     TODAY'S TREATMENT: DATE: 08/06/2023   SUBJECTIVE STATEMENT:   Pt reports notable pressure across waist at the time. She reports pain along L flank primarily. Pt reports LLE feels "different" versus R side. Patient reports difficulty with negotiating steps this AM. She reports that presently, her pain rating scale is at 6-7/10 NPRS. Pt feels that she is somewhat better generally; she states she is better than she was yesterday.     Manual Therapy - for symptom modulation, soft tissue sensitivity and mobility, joint mobility, ROM   In R sidelying: L L4-S1 segmental distraction distraction; 2-3 sec bouts x 20 repetitions STM and IASTM with Hypervolt along L L3-S1 longissimus lumborum, L QL, L gluteal musculature; x 8  minutes  Manual lumbar traction in supine with Mulligan belt; 10 sec on, 10 sec off; x 8 minutes for nerve root decompression and symptom modulation    Therapeutic Exercise - for improved soft tissue flexibility and extensibility as needed for ROM, dural/neural mobility as needed to reduce neuralgias/paresthesias,    Double knee to chest, with clinician overpressure; 2x10, 1 sec   -fleeting increase in pressure along L paraspinal region; better after  Lower trunk rotations; 1x10 alternating R/L Posterior pelvic tilt; 2x10  Seated blue physioball rollout; 3-way (anterior and anterolateral R/L);  x 5 ea dir    PATIENT EDUCATION: HEP reviewed. Discussed expectation for relief of peripheral symptoms initially. We discussed stopping current repeated movement program if experiencing persistent worsening of leg symptoms.   MHP (unbilled) utilized post-treatment for analgesic effect and improved soft tissue extensibility, along low back with pt sitting in chair with upright lumbar support; x  5 minutes.    *not today* Double knee to chest; 1x10, 1 sec Sciatic nerve flossing, hooklying; 2x10, 1 sec hold     PATIENT EDUCATION:  Education details: see above for patient education  details Person educated: Patient Education method: Explanation, Demonstration, and Handouts Education comprehension: verbalized understanding and returned demonstration   HOME EXERCISE PROGRAM:  Access Code: [email or web address] URL: [email or web address] Date: 07/28/2023 Prepared by: Consuela Mimes  Exercises - Hooklying Lumbar Traction  - 3 x daily - 7 x weekly - 2 sets - 10 reps - Supine Double Knee to Chest  - 5-6 x daily - 7 x weekly - 1 sets - 10 reps - 1sec hold - Supine Lower Trunk Rotation  - 2 x daily - 7 x weekly - 2 sets - 10 reps - Supine 90/90 Sciatic Nerve Glide with Knee Flexion/Extension  - 2 x daily - 7 x weekly - 2 sets - 10 reps - 1-2sec hold   ASSESSMENT:  CLINICAL  IMPRESSION: Patient appears to be tolerating bed mobility (scooting, sitting to supine transfers) and sit to stand better, though there is still mild grimacing; she is able to more readily perform these activities with some degree of pain. Pt does have mitigation of leg symptoms with repeated flexion. L flank symptoms persist through manual techniques performed today as well as exercise afterward. We used MHP post-session for residual L lower quarter pain. Pt fortunately does feel she is making modest/gradual progress with PT, though she is still functionally limited. Pt has remaining deficits in thoracolumbar AROM, posterior chain soft tissue mobility/dural and neural mobility, decreased hip and LLE strength, and sensitivity to palpation along L>R lumbar paraspinals and gluteal musculature. Pt will continue to benefit from skilled PT services to address deficits and improve function.   OBJECTIVE IMPAIRMENTS: decreased activity tolerance, difficulty walking, decreased ROM, decreased strength, hypomobility, impaired flexibility, and pain.   ACTIVITY LIMITATIONS: standing, squatting, stairs, transfers, bed mobility, and locomotion level  PARTICIPATION LIMITATIONS: meal prep, cleaning, driving, shopping, and community activity  PERSONAL FACTORS: Age, Past/current experiences, and 3+ comorbidities: (depression, HTN, GERD, anxiety)  are also affecting patient's functional outcome.   REHAB POTENTIAL: Good  CLINICAL DECISION MAKING: Evolving/moderate complexity  EVALUATION COMPLEXITY: Moderate   GOALS: Goals reviewed with patient? Yes  SHORT TERM GOALS: Target date: 08/13/2023  Pt will be independent with HEP in order to improve strength and decrease back pain to improve pain-free function at home and work. Baseline: 07/22/23: Baseline HEP initiated.  Goal status: INITIAL   LONG TERM GOALS: Target date: 09/03/2023  Pt will increase FOTO to at least 50 to demonstrate significant improvement in  function at home and work related to back pain  Baseline: 07/22/23: 25 Goal status: INITIAL  2.  Pt will decrease worst back pain by at least 2 points on the NPRS in order to demonstrate clinically significant reduction in back pain. Baseline: 07/22/23: 8-9/10 Goal status: INITIAL  3.  Patient will have full thoracolumbar AROM without reproduction of pain as needed for reaching items on ground, household chores, bending.     Baseline: 07/22/23: Pain and motion loss in all directions Goal status: INITIAL  4.  Pt will demonstrate negotiation of full flight of steps with reciprocal pattern and no movement deviations and no increase in lower quarter pain as needed for community-level mobility  Baseline: 07/22/23: Significant limitation with stair negotiation Goal status: INITIAL  5.  Pt will perform sit to stand with no UE assist and no reproduction of pain as needed for self-care and home-level mobility  Baseline: 07/22/23: Significant pain behaviors and Gower's sign when getting up from seat Goal status: INITIAL   PLAN: PT FREQUENCY: 1-2x/week  PT DURATION: 6 weeks  PLANNED  INTERVENTIONS: Therapeutic exercises, Therapeutic activity, Neuromuscular re-education, Balance training, Gait training, Patient/Family education, Self Care, Joint mobilization, Joint manipulation, Vestibular training, Canalith repositioning, Orthotic/Fit training, DME instructions, Dry Needling, Electrical stimulation, Spinal manipulation, Spinal mobilization, Cryotherapy, Moist heat, Taping, Traction, Ultrasound, Ionotophoresis 4mg /ml Dexamethasone, Manual therapy, and Re-evaluation.  PLAN FOR NEXT SESSION: Manual lumbar traction, repeated flexion/flexion principle MDT, posterior chain mobility/nerve flossing, manual techniques for soft tissue sensitivity prn.    Consuela Mimes, PT, DPT #[id number]  Gertie Exon, PT 08/06/2023, 10:24 AM

## 2023-08-06 NOTE — Progress Notes (Signed)
[name],acting as a scribe for Riki Altes, MD.,have documented all relevant documentation on the behalf of Riki Altes, MD,as directed by  Riki Altes, MD while in the presence of Riki Altes, MD.  08/06/23\ 10:47 AM   [name] 01-10-1951 [id number]  Referring provider: Erasmo Downer, MD [address] [place],  [place] [address]  Chief Complaint  Patient presents with   Other    Renal mass     HPI: [name] is a 72 y.o. female who is referred for evaluation of right renal mass.   On a lumbar spine MRI performed 06/2023, she was incidentally noted to have a lipomatous lesion of the lower pole of the right kidney. Renal mass protocol CT subsequently performed at [facility], which showed a 9.4 by 7.2 by 7.2 cm fat-containing renal mass consistent with angiomyolipoma.  No gross hematuria.She does have chronic back pain.   PMH: Past Medical History:  Diagnosis Date   Actinic keratosis    Allergy    Anxiety    Breast mass 10/07/2016   left 2:00   Depression    GERD (gastroesophageal reflux disease)    Hypertension     Surgical History: Past Surgical History:  Procedure Laterality Date   ABDOMINAL HYSTERECTOMY  1988   complete; due to fibroids   BLADDER REPAIR  2007   BREAST BIOPSY Left 11/25/2022   [place] Axilla Bx, Path pending   CHOLECYSTECTOMY  2003   COLONOSCOPY  04/16/2009   KNEE SURGERY Left 2012   TONSILLECTOMY  1960    Home Medications:  Allergies as of 08/06/2023   No Known Allergies      Medication List        Accurate as of August 06, 2023 10:47 AM. If you have any questions, ask your nurse or doctor.          STOP taking these medications    VITAMIN K2 PO Stopped by: Riki Altes       TAKE these medications    amLODipine-benazepril 10-40 MG capsule Commonly known as: LOTREL TAKE 1 CAPSULE DAILY   escitalopram 10 MG tablet Commonly known as: LEXAPRO TAKE 1 TABLET AT  BEDTIME   Fish Oil 1200 MG Caps Take 1,290 mg by mouth daily.   gabapentin 300 MG capsule Commonly known as: NEURONTIN Take 300 mg by mouth 3 (three) times daily.   Potassium 99 MG Tabs Take 99 mg by mouth daily.   selenium 50 MCG Tabs tablet Take 50 mcg by mouth daily.   vitamin C 100 MG tablet Take by mouth daily.   Vitamin D3 50 MCG (2000 UT) Tabs Take 5,000 Units by mouth daily.   Zinc Gluconate 50 MG Caps Take 1 tablet by mouth daily.        Family History: Family History  Problem Relation Age of Onset   Cervical cancer Mother    COPD Mother    COPD Father    Hypertension Father    Colon cancer Neg Hx    Colon polyps Neg Hx    Esophageal cancer Neg Hx    Rectal cancer Neg Hx    Stomach cancer Neg Hx     Social History:  reports that she has never smoked. She has never used smokeless tobacco. She reports that she does not drink alcohol and does not use drugs.   Physical Exam: BP 126/76   Pulse 69   Ht 5\' 2"  (1.575  m)   Wt 155 lb (70.3 kg)   BMI 28.35 kg/m   Constitutional:  Alert and oriented, No acute distress. HEENT: [place] AT, moist mucus membranes.  Trachea midline, no masses. Neurologic: Grossly intact, no focal deficits, moving all 4 extremities. Psychiatric: Normal mood and affect.  Assessment & Plan:    1. Right renal mass Large renal mass radiographically consistent with an angiomyolipoma. We discussed that these are benign lesions. However, AML's >4 cm are at risk for spontaneous bleeding and treatment is recommended.  Treatment options include surgical excision and percutaneous embolization.  Probably too large for percutaneous ablation.  Will discuss with Dr. Apolinar Junes.   I have reviewed the above documentation for accuracy and completeness, and I agree with the above.   Riki Altes, MD  [facility] [address], [address] [place], [place] [address] [phone]

## 2023-08-13 ENCOUNTER — Telehealth: Payer: Self-pay | Admitting: Physical Therapy

## 2023-08-13 ENCOUNTER — Ambulatory Visit: Payer: Managed Care, Other (non HMO) | Admitting: Physical Therapy

## 2023-08-13 NOTE — Telephone Encounter (Signed)
Called pt regarding no-show appointment today. No answer this afternoon. LVM to confirm future PT visits.

## 2023-08-13 NOTE — Therapy (Deleted)
OUTPATIENT PHYSICAL THERAPY TREATMENT   Patient Name: [name] MRN: [id number] DOB:1951/02/25, 72 y.o., female Today's Date: 08/13/2023   END OF SESSION:        Past Medical History:  Diagnosis Date   Actinic keratosis    Allergy    Anxiety    Breast mass 10/07/2016   left 2:00   Depression    GERD (gastroesophageal reflux disease)    Hypertension    Past Surgical History:  Procedure Laterality Date   ABDOMINAL HYSTERECTOMY  1988   complete; due to fibroids   BLADDER REPAIR  2007   BREAST BIOPSY Left 11/25/2022   [place] Axilla Bx, Path pending   CHOLECYSTECTOMY  2003   COLONOSCOPY  04/16/2009   KNEE SURGERY Left 2012   TONSILLECTOMY  1960   Patient Active Problem List   Diagnosis Date Noted   Annual physical exam 05/30/2022   Depression, recurrent (HCC) 05/30/2022   Primary hypertension 05/30/2022   Screening mammogram for breast cancer 05/30/2022   Gastroesophageal reflux disease without esophagitis 07/18/2021    PCP: Jacky Kindle, FNP  REFERRING PROVIDER: Lazarus Gowda*  REFERRING DIAG:  M54.16 (ICD-10-CM) - Radiculopathy, lumbar region    RATIONALE FOR EVALUATION AND TREATMENT: Rehabilitation  THERAPY DIAG: Other low back pain  Radiculopathy, lumbar region  Muscle weakness (generalized)  ONSET DATE: Mid-October 2024;   FOLLOW-UP APPT SCHEDULED WITH REFERRING PROVIDER: Yes    PERTINENT HISTORY: [name] with Hx of low back/R hip pain, no radiographic explanation of hip pain; current referral for lumbar radiculopathy. Pt reports Hx of bone spur in R hip that doesn't typically bother her that much; she had injection in it last year. Pt reports pain across her back and down her LLE; numbness down L leg down to L foot. Pt reports feeling unbalanced and unstable on her feet. Pt reports she used to walk up to 10,000 steps/day; she reports she has not done this lately. Symptoms began mid-October when she was  bathing her dog in shower; she was bent over bathing dog and felt notable pain upon standing afterward. Pt is using Gabapentin for neuralgia management; OxyContin for severe pain episodes about 3 times total now. Pt reports swelling in ankles after prolonged standing, no history of peripheral vascular disease.   PAIN:    Pain Intensity: Present: 5-6/10, Worst: 8-9/10 Pain location: Across low back, pain down LLE, heaviness into L thigh Pain Quality: sharp and dull ; "numbing and tingling" Radiating: Yes, down LLE Numbness/Tingling: Yes; down LLE to toes, into great toe especially  Focal Weakness: Yes; LLE weakness, intermittent difficulty weightbearing Aggravating factors: standing "any length of time," sit to stand after prolonged sitting, turning over in bed, transferring from car, up/down stairs  Relieving factors: Gabapentin, heating pad, TENS, stretching, heat 24-hour pain behavior: Worse in PM previously; no 24-hour pain pattern now  How long can you stand: 30 minutes standing in kitchen tolerated; then, she has to lie down/get off feet  History of prior back injury, pain, surgery, or therapy: Yes; Hx of chiropractic for this episode of pain initially   Imaging: Yes ; Lin Landsman, Karie Fetch, MD   EXAM: Magnetic resonance imaging, spinal canal and contents, lumbar, without contrast material. DATE: 07/06/2023 11:24 AM ACCESSION: [id number] UN DICTATED: 07/06/2023 11:37 AM   --Multilevel degenerative changes with varying degrees of spinal canal and neuroforaminal stenosis, most pronounced at L4-L5 and L5-S1.  --Prominent masslike signal abnormality along middle/lower pole of right kidney, indeterminate and  may reflect a lipomatous lesion or artifact from adjacent bowel. Recommend dedicated evaluation with contrast enhanced CT to rule out possibility of mass lesion.     Minna Antis [place], [place]   CT Scan - Renal 07/21/23 EXAM: CT RENAL MASS (RENAL MASS  PROTOCOL) ACCESSION: [id number] UN   CLINICAL INDICATION: follow up MRI finding - N28.89 - Renal mass  FINDINGS:  LOWER CHEST: Left lower lobe pulmonary nodules measuring up to 0.3 cm (11:17, 11:14).  LIVER: Normal liver contour. No focal liver lesions.  BILIARY: Gallbladder is surgically absent. Common bile duct dilation measuring up to 0.8 cm (13:39), likely secondary to postcholecystectomy state.  SPLEEN: Subcentimeter splenic hypodensity (11:19), too small to further characterize by CT.  PANCREAS: Normal pancreatic contour. No focal lesions. No ductal dilation.  ADRENAL GLANDS: Normal appearance of the adrenal glands.  KIDNEYS: Right Kidney: Right kidney exophytic interpolar anterior heterogeneous enhancing fat-containing mass without calcifications measuring 9.4 x 7.2 x 7.2 cm (11:54, 13:41). No obstructing calculi. No hydronephrosis. Left Kidney: Homogeneous enhancement. No obstructing calculi. No hydronephrosis. No suspicious enhancing mass.  GI TRACT: No findings of bowel obstruction or acute inflammation.  PERITONEUM, RETROPERITONEUM AND MESENTERY: No free air. No ascites. No fluid collection.  LYMPH NODES: No adenopathy.  VESSELS: Hepatic and portal veins are patent. Normal caliber aorta.  BONES and SOFT TISSUES: No aggressive osseous lesions. Small fat-containing umbilical hernia.  IMPRESSION: -Fat-containing right renal lesion measuring up to 9.4 cm with imaging characteristics most consistent with a benign angiomyolipoma. However, given size, lesion carries risk for spontaneous bleeding and retroperitoneal hemorrhage.  --Left basilar pulmonary micronodules. If patient is at increased risk for lung cancer, consider follow-up chest CT without contrast in 12 months.     Red flags: Negative for bowel/bladder changes, saddle paresthesia, personal history of cancer, h/o spinal tumors, h/o compression fx, h/o abdominal aneurysm, abdominal pain, chills/fever, night  sweats, nausea, vomiting, unrelenting pain, first onset of insidious LBP <20 y/o  -Difficulty with urinary frequency/urgency, urinary incontinence   Living Environment Lives with: lives with their spouse; husband travels for work; pt is at home alone Tues/Wed/Thur Lives in: House/apartment; 2-level home, Oceanographer bedroom Stairs: Yes: Internal: 13 steps; on left going up and External: 3 steps; can reach both handrails; 4 steps at backdoor Has following equipment at home: None  Prior level of function: Independent  Occupational demands: Retired  Presenter, broadcasting: Social research officer, government, cooking, walking routine  Patient Goals: Get back to normal activities    OBJECTIVE:   GAIT: Distance walked: 50 ft Assistive device utilized: None Level of assistance: Complete Independence Comments: Decreased arm swing/trunk rotation, slow velocity and step cadence  Posture: Lumbar lordosis: WNL Mild rounded shoulders  Iliac crest height: Equal bilaterally Lumbar lateral shift: Negative Guarded posture, pt maintains pressure through hands in sitting  AROM AROM (Normal range in degrees) AROM  07/22/23  Lumbar   Flexion (65) 50%* (pain at waist L>R)  Extension (30) 25%* (numbness into toes)  Right lateral flexion (25) 75%*  Left lateral flexion (25) 75% (mild symptoms)  Right rotation (30) 25%*  Left rotation (30) 25%      Hip Right Left  Flexion (125) 120* 120*  Extension (15)    Abduction (40)    Adduction     Internal Rotation (45) 30 20*  External Rotation (45) 45 45*      (* = pain; Blank rows = not tested)  LE MMT: MMT (out of 5) Right 07/22/23 Left 07/22/23  Hip flexion 4* 3+*  Hip  extension    Hip abduction 4- 4-  Hip adduction    Hip internal rotation    Hip external rotation    Knee flexion 5 4-*  Knee extension 5 3+*  Ankle dorsiflexion 5 3+*  Ankle plantarflexion    Ankle inversion    Ankle eversion    (* = pain; Blank rows = not tested)  Sensation Grossly intact to  light touch throughout bilateral LEs as determined by testing dermatomes L2-S2. Proprioception, stereognosis, and hot/cold testing deferred on this date.  Reflexes R/L Knee Jerk (L3/4): 2+/2+  Ankle Jerk (S1/2): 2+/1+   Muscle Length Hamstrings: R: Positive L: Positive E[place] (quadriceps): R: Not examined L: Not examined Thomas (hip flexors): R: Not examined L: Not examined   Palpation Location Right Left         Lumbar paraspinals 1 1  Quadratus Lumborum    Gluteus Maximus 1 1  Gluteus Medius  1  Deep hip external rotators  0  PSIS  1  Fortin's Area (SIJ)    Greater Trochanter    (Blank rows = not tested) Graded on 0-4 scale (0 = no pain, 1 = pain, 2 = pain with wincing/grimacing/flinching, 3 = pain with withdrawal, 4 = unwilling to allow palpation)  Passive Accessory Intervertebral Motion Deferred  Special Tests Lumbar Radiculopathy and Discogenic: Centralization and Peripheralization (SN 92, -LR 0.12): Repeated flexion in lying; pressure in back during, no worse after Slump (SN 83, -LR 0.32): R: Positive L: Positive SLR (SN 92, -LR 0.29): R: Positive L:  Positive   Lumbar Foraminal Stenosis: Lumbar quadrant (SN 70): R: Negative L: Positive     TODAY'S TREATMENT: DATE: 08/13/2023   SUBJECTIVE STATEMENT:   Pt reports notable pressure across waist at the time. She reports pain along L flank primarily. Pt reports LLE feels "different" versus R side. Patient reports difficulty with negotiating steps this AM. She reports that presently, her pain rating scale is at 6-7/10 NPRS. Pt feels that she is somewhat better generally; she states she is better than she was yesterday.     Manual Therapy - for symptom modulation, soft tissue sensitivity and mobility, joint mobility, ROM   In R sidelying: L L4-S1 segmental distraction distraction; 2-3 sec bouts x 20 repetitions STM and IASTM with [employer] along L L3-S1 longissimus lumborum, L QL, L gluteal musculature; x 8  minutes  Manual lumbar traction in supine with Mulligan belt; 10 sec on, 10 sec off; x 8 minutes for nerve root decompression and symptom modulation    Therapeutic Exercise - for improved soft tissue flexibility and extensibility as needed for ROM, dural/neural mobility as needed to reduce neuralgias/paresthesias,    Double knee to chest, with clinician overpressure; 2x10, 1 sec   -fleeting increase in pressure along L paraspinal region; better after  Lower trunk rotations; 1x10 alternating R/L Posterior pelvic tilt; 2x10  Seated blue physioball rollout; 3-way (anterior and anterolateral R/L);  x 5 ea dir    PATIENT EDUCATION: HEP reviewed. Discussed expectation for relief of peripheral symptoms initially. We discussed stopping current repeated movement program if experiencing persistent worsening of leg symptoms.   MHP (unbilled) utilized post-treatment for analgesic effect and improved soft tissue extensibility, along low back with pt sitting in chair with upright lumbar support; x  5 minutes.    *not today* Double knee to chest; 1x10, 1 sec Sciatic nerve flossing, hooklying; 2x10, 1 sec hold     PATIENT EDUCATION:  Education details: see above for patient education  details Person educated: Patient Education method: Explanation, Demonstration, and Handouts Education comprehension: verbalized understanding and returned demonstration   HOME EXERCISE PROGRAM:  Access Code: [email or web address] URL: [email or web address] Date: 07/28/2023 Prepared by: Consuela Mimes  Exercises - Hooklying Lumbar Traction  - 3 x daily - 7 x weekly - 2 sets - 10 reps - Supine Double Knee to Chest  - 5-6 x daily - 7 x weekly - 1 sets - 10 reps - 1sec hold - Supine Lower Trunk Rotation  - 2 x daily - 7 x weekly - 2 sets - 10 reps - Supine 90/90 Sciatic Nerve Glide with Knee Flexion/Extension  - 2 x daily - 7 x weekly - 2 sets - 10 reps - 1-2sec hold   ASSESSMENT:  CLINICAL  IMPRESSION: Patient appears to be tolerating bed mobility (scooting, sitting to supine transfers) and sit to stand better, though there is still mild grimacing; she is able to more readily perform these activities with some degree of pain. Pt does have mitigation of leg symptoms with repeated flexion. L flank symptoms persist through manual techniques performed today as well as exercise afterward. We used MHP post-session for residual L lower quarter pain. Pt fortunately does feel she is making modest/gradual progress with PT, though she is still functionally limited. Pt has remaining deficits in thoracolumbar AROM, posterior chain soft tissue mobility/dural and neural mobility, decreased hip and LLE strength, and sensitivity to palpation along L>R lumbar paraspinals and gluteal musculature. Pt will continue to benefit from skilled PT services to address deficits and improve function.   OBJECTIVE IMPAIRMENTS: decreased activity tolerance, difficulty walking, decreased ROM, decreased strength, hypomobility, impaired flexibility, and pain.   ACTIVITY LIMITATIONS: standing, squatting, stairs, transfers, bed mobility, and locomotion level  PARTICIPATION LIMITATIONS: meal prep, cleaning, driving, shopping, and community activity  PERSONAL FACTORS: Age, Past/current experiences, and 3+ comorbidities: (depression, HTN, GERD, anxiety)  are also affecting patient's functional outcome.   REHAB POTENTIAL: Good  CLINICAL DECISION MAKING: Evolving/moderate complexity  EVALUATION COMPLEXITY: Moderate   GOALS: Goals reviewed with patient? Yes  SHORT TERM GOALS: Target date: 08/13/2023  Pt will be independent with HEP in order to improve strength and decrease back pain to improve pain-free function at home and work. Baseline: 07/22/23: Baseline HEP initiated.  Goal status: INITIAL   LONG TERM GOALS: Target date: 09/03/2023  Pt will increase FOTO to at least 50 to demonstrate significant improvement in  function at home and work related to back pain  Baseline: 07/22/23: 25 Goal status: INITIAL  2.  Pt will decrease worst back pain by at least 2 points on the NPRS in order to demonstrate clinically significant reduction in back pain. Baseline: 07/22/23: 8-9/10 Goal status: INITIAL  3.  Patient will have full thoracolumbar AROM without reproduction of pain as needed for reaching items on ground, household chores, bending.     Baseline: 07/22/23: Pain and motion loss in all directions Goal status: INITIAL  4.  Pt will demonstrate negotiation of full flight of steps with reciprocal pattern and no movement deviations and no increase in lower quarter pain as needed for community-level mobility  Baseline: 07/22/23: Significant limitation with stair negotiation Goal status: INITIAL  5.  Pt will perform sit to stand with no UE assist and no reproduction of pain as needed for self-care and home-level mobility  Baseline: 07/22/23: Significant pain behaviors and Gower's sign when getting up from seat Goal status: INITIAL   PLAN: PT FREQUENCY: 1-2x/week  PT DURATION: 6 weeks  PLANNED  INTERVENTIONS: Therapeutic exercises, Therapeutic activity, Neuromuscular re-education, Balance training, Gait training, Patient/Family education, Self Care, Joint mobilization, Joint manipulation, Vestibular training, Canalith repositioning, Orthotic/Fit training, DME instructions, Dry Needling, Electrical stimulation, Spinal manipulation, Spinal mobilization, Cryotherapy, Moist heat, Taping, Traction, Ultrasound, Ionotophoresis 4mg /ml Dexamethasone, Manual therapy, and Re-evaluation.  PLAN FOR NEXT SESSION: Manual lumbar traction, repeated flexion/flexion principle MDT, posterior chain mobility/nerve flossing, manual techniques for soft tissue sensitivity prn.    Consuela Mimes, PT, DPT #[id number]  Gertie Exon, PT 08/13/2023, 12:37 PM

## 2023-08-18 ENCOUNTER — Ambulatory Visit: Payer: Managed Care, Other (non HMO) | Admitting: Physical Therapy

## 2023-08-18 ENCOUNTER — Encounter: Payer: Self-pay | Admitting: Physical Therapy

## 2023-08-18 DIAGNOSIS — M5459 Other low back pain: Secondary | ICD-10-CM

## 2023-08-18 DIAGNOSIS — M5416 Radiculopathy, lumbar region: Secondary | ICD-10-CM

## 2023-08-18 DIAGNOSIS — M6281 Muscle weakness (generalized): Secondary | ICD-10-CM

## 2023-08-18 NOTE — Therapy (Addendum)
 OUTPATIENT PHYSICAL THERAPY TREATMENT   Patient Name: [name] MRN: [id number] DOB:November 13, 1950, 72 y.o., female Today's Date: 08/18/2023   END OF SESSION:  PT End of Session - 08/18/23 1253     Visit Number 5    Number of Visits 13    Date for PT Re-Evaluation 09/03/23    Authorization Type [employer]/Medicare [id number]    PT Start Time 1259    PT Stop Time 1348    PT Time Calculation (min) 49 min    Activity Tolerance Patient limited by pain    Behavior During Therapy [facility] for tasks assessed/performed              Past Medical History:  Diagnosis Date   Actinic keratosis    Allergy    Anxiety    Breast mass 10/07/2016   left 2:00   Depression    GERD (gastroesophageal reflux disease)    Hypertension    Past Surgical History:  Procedure Laterality Date   ABDOMINAL HYSTERECTOMY  1988   complete; due to fibroids   BLADDER REPAIR  2007   BREAST BIOPSY Left 11/25/2022   US  Axilla Bx, Path pending   CHOLECYSTECTOMY  2003   COLONOSCOPY  04/16/2009   KNEE SURGERY Left 2012   TONSILLECTOMY  1960   Patient Active Problem List   Diagnosis Date Noted   Annual physical exam 05/30/2022   Depression, recurrent (HCC) 05/30/2022   Primary hypertension 05/30/2022   Screening mammogram for breast cancer 05/30/2022   Gastroesophageal reflux disease without esophagitis 07/18/2021    PCP: Emilio Kelly DASEN, FNP  REFERRING PROVIDER: Dreama Ozell Mutters*  REFERRING DIAG:  M54.16 (ICD-10-CM) - Radiculopathy, lumbar region    RATIONALE FOR EVALUATION AND TREATMENT: Rehabilitation  THERAPY DIAG: Other low back pain  Radiculopathy, lumbar region  Muscle weakness (generalized)  ONSET DATE: Mid-October 2024;   FOLLOW-UP APPT SCHEDULED WITH REFERRING PROVIDER: Yes    PERTINENT HISTORY: [name] with Hx of low back/R hip pain, no radiographic explanation of hip pain; current referral for lumbar radiculopathy. Pt reports Hx of bone spur in R hip  that doesn't typically bother her that much; she had injection in it last year. Pt reports pain across her back and down her LLE; numbness down L leg down to L foot. Pt reports feeling unbalanced and unstable on her feet. Pt reports she used to walk up to 10,000 steps/day; she reports she has not done this lately. Symptoms began mid-October when she was bathing her dog in shower; she was bent over bathing dog and felt notable pain upon standing afterward. Pt is using Gabapentin for neuralgia management; OxyContin  for severe pain episodes about 3 times total now. Pt reports swelling in ankles after prolonged standing, no history of peripheral vascular disease.   PAIN:    Pain Intensity: Present: 5-6/10, Worst: 8-9/10 Pain location: Across low back, pain down LLE, heaviness into L thigh Pain Quality: sharp and dull ; numbing and tingling Radiating: Yes, down LLE Numbness/Tingling: Yes; down LLE to toes, into great toe especially  Focal Weakness: Yes; LLE weakness, intermittent difficulty weightbearing Aggravating factors: standing any length of time, sit to stand after prolonged sitting, turning over in bed, transferring from car, up/down stairs  Relieving factors: Gabapentin, heating pad, TENS, stretching, heat 24-hour pain behavior: Worse in PM previously; no 24-hour pain pattern now  How long can you stand: 30 minutes standing in kitchen tolerated; then, she has to lie down/get off feet  History  of prior back injury, pain, surgery, or therapy: Yes; Hx of chiropractic for this episode of pain initially   Imaging: Yes ; Valdemar Sanders, Eric Calamity, MD   EXAM: Magnetic resonance imaging, spinal canal and contents, lumbar, without contrast material. DATE: 07/06/2023 11:24 AM ACCESSION: [id number] UN DICTATED: 07/06/2023 11:37 AM   --Multilevel degenerative changes with varying degrees of spinal canal and neuroforaminal stenosis, most pronounced at L4-L5 and L5-S1.  --Prominent  masslike signal abnormality along middle/lower pole of right kidney, indeterminate and may reflect a lipomatous lesion or artifact from adjacent bowel. Recommend dedicated evaluation with contrast enhanced CT to rule out possibility of mass lesion.     Finazzo, Josephine Joann, [place]   CT Scan - Renal 07/21/23 EXAM: CT RENAL MASS (RENAL MASS PROTOCOL) ACCESSION: [id number] UN   CLINICAL INDICATION: follow up MRI finding - N28.89 - Renal mass  FINDINGS:  LOWER CHEST: Left lower lobe pulmonary nodules measuring up to 0.3 cm (11:17, 11:14).  LIVER: Normal liver contour. No focal liver lesions.  BILIARY: Gallbladder is surgically absent. Common bile duct dilation measuring up to 0.8 cm (13:39), likely secondary to postcholecystectomy state.  SPLEEN: Subcentimeter splenic hypodensity (11:19), too small to further characterize by CT.  PANCREAS: Normal pancreatic contour. No focal lesions. No ductal dilation.  ADRENAL GLANDS: Normal appearance of the adrenal glands.  KIDNEYS: Right Kidney: Right kidney exophytic interpolar anterior heterogeneous enhancing fat-containing mass without calcifications measuring 9.4 x 7.2 x 7.2 cm (11:54, 13:41). No obstructing calculi. No hydronephrosis. Left Kidney: Homogeneous enhancement. No obstructing calculi. No hydronephrosis. No suspicious enhancing mass.  GI TRACT: No findings of bowel obstruction or acute inflammation.  PERITONEUM, RETROPERITONEUM AND MESENTERY: No free air. No ascites. No fluid collection.  LYMPH NODES: No adenopathy.  VESSELS: Hepatic and portal veins are patent. Normal caliber aorta.  BONES and SOFT TISSUES: No aggressive osseous lesions. Small fat-containing umbilical hernia.  IMPRESSION: -Fat-containing right renal lesion measuring up to 9.4 cm with imaging characteristics most consistent with a benign angiomyolipoma. However, given size, lesion carries risk for spontaneous bleeding and retroperitoneal hemorrhage.  --Left  basilar pulmonary micronodules. If patient is at increased risk for lung cancer, consider follow-up chest CT without contrast in 12 months.     Red flags: Negative for bowel/bladder changes, saddle paresthesia, personal history of cancer, h/o spinal tumors, h/o compression fx, h/o abdominal aneurysm, abdominal pain, chills/fever, night sweats, nausea, vomiting, unrelenting pain, first onset of insidious LBP <20 y/o  -Difficulty with urinary frequency/urgency, urinary incontinence   Living Environment Lives with: lives with their spouse; husband travels for work; pt is at home alone Tues/Wed/Thur Lives in: House/apartment; 2-level home, oceanographer bedroom Stairs: Yes: Internal: 13 steps; on left going up and External: 3 steps; can reach both handrails; 4 steps at backdoor Has following equipment at home: None  Prior level of function: Independent  Occupational demands: Retired  Presenter, Broadcasting: Social Research Officer, Government, cooking, walking routine  Patient Goals: Get back to normal activities    OBJECTIVE:   GAIT: Distance walked: 50 ft Assistive device utilized: None Level of assistance: Complete Independence Comments: Decreased arm swing/trunk rotation, slow velocity and step cadence  Posture: Lumbar lordosis: WNL Mild rounded shoulders  Iliac crest height: Equal bilaterally Lumbar lateral shift: Negative Guarded posture, pt maintains pressure through hands in sitting  AROM AROM (Normal range in degrees) AROM  07/22/23  Lumbar   Flexion (65) 50%* (pain at waist L>R)  Extension (30) 25%* (numbness into toes)  Right lateral flexion (25) 75%*  Left lateral flexion (  25) 75% (mild symptoms)  Right rotation (30) 25%*  Left rotation (30) 25%      Hip Right Left  Flexion (125) 120* 120*  Extension (15)    Abduction (40)    Adduction     Internal Rotation (45) 30 20*  External Rotation (45) 45 45*      (* = pain; Blank rows = not tested)  LE MMT: MMT (out of 5) Right 07/22/23  Left 07/22/23  Hip flexion 4* 3+*  Hip extension    Hip abduction 4- 4-  Hip adduction    Hip internal rotation    Hip external rotation    Knee flexion 5 4-*  Knee extension 5 3+*  Ankle dorsiflexion 5 3+*  Ankle plantarflexion    Ankle inversion    Ankle eversion    (* = pain; Blank rows = not tested)  Sensation Grossly intact to light touch throughout bilateral LEs as determined by testing dermatomes L2-S2. Proprioception, stereognosis, and hot/cold testing deferred on this date.  Reflexes R/L Knee Jerk (L3/4): 2+/2+  Ankle Jerk (S1/2): 2+/1+   Muscle Length Hamstrings: R: Positive L: Positive Ely (quadriceps): R: Not examined L: Not examined Thomas (hip flexors): R: Not examined L: Not examined   Palpation Location Right Left         Lumbar paraspinals 1 1  Quadratus Lumborum    Gluteus Maximus 1 1  Gluteus Medius  1  Deep hip external rotators  0  PSIS  1  Fortin's Area (SIJ)    Greater Trochanter    (Blank rows = not tested) Graded on 0-4 scale (0 = no pain, 1 = pain, 2 = pain with wincing/grimacing/flinching, 3 = pain with withdrawal, 4 = unwilling to allow palpation)  Passive Accessory Intervertebral Motion Deferred  Special Tests Lumbar Radiculopathy and Discogenic: Centralization and Peripheralization (SN 92, -LR 0.12): Repeated flexion in lying; pressure in back during, no worse after Slump (SN 83, -LR 0.32): R: Positive L: Positive SLR (SN 92, -LR 0.29): R: Positive L:  Positive   Lumbar Foraminal Stenosis: Lumbar quadrant (SN 70): R: Negative L: Positive     TODAY'S TREATMENT: DATE: 08/18/2023   SUBJECTIVE STATEMENT:   Pt missed last visit due to forgetting with holidays this past week. Pt reports L flank pain and radiating down to L gluteal region. Pt reports declining condition after prolonged walking. Pt reports tolerating walking up to about 30 mins, and then she has more pain. She reports lessening radiating pain recently. Patient  reports compliance with HEP. Pain is intermittently up to 7/10.     Manual Therapy - for symptom modulation, soft tissue sensitivity and mobility, joint mobility, ROM   In R sidelying: STM and IASTM with Hypervolt along L L3-S1 longissimus lumborum, L QL, L gluteal musculature; x 15 minutes  Manual lumbar traction in supine with Mulligan belt; 10 sec on, 10 sec off; x 8 minutes for nerve root decompression and symptom modulation   *not today* L L4-S1 segmental distraction distraction; 2-3 sec bouts x 20 repetitions   Trigger Point Dry Needling  Initial Treatment: Pt instructed on Dry Needling rational, procedures, and possible side effects. Pt instructed to expect mild to moderate muscle soreness later in the day and/or into the next day.  Pt instructed to continue prescribed HEP. Patient verbalized understanding of these instructions and education.   Patient Verbal Consent Given: Yes Education Handout Provided: No ,provided on subsequent visit Muscles Treated: L multifidus at L4 and L5 levels,  L iliocostalislumborum at L4-5 Electrical Stimulation Performed: No Treatment Response/Outcome: Mild post-treatment soreness.     Therapeutic Exercise - for improved soft tissue flexibility and extensibility as needed for ROM, dural/neural mobility as needed to reduce neuralgias/paresthesias,    AROM Lumbar flexion 75% Lumbar extension 25%* Lateral flexion: R 75% , L 75%* Thoracolumbar rotation: R 75%, L 50%*   Double knee to chest, with clinician overpressure; 2x10, 1 sec   -fleeting increase in pressure along L paraspinal region; better after  Lower trunk rotations; 1x10 alternating R/L Posterior pelvic tilt; 2x10 Posterior pelvic tilt to bridge; 2x8  Seated blue physioball rollout; 3-way (anterior and anterolateral R/L);  x 5 ea dir    PATIENT EDUCATION: HEP update. Discussed use of shopping cart/buggy for longer shopping trip to improve tolerance of walking.     *not  today* MHP (unbilled) utilized post-treatment for analgesic effect and improved soft tissue extensibility, along low back with pt sitting in chair with upright lumbar support; x  5 minutes. Double knee to chest; 1x10, 1 sec Sciatic nerve flossing, hooklying; 2x10, 1 sec hold     PATIENT EDUCATION:  Education details: see above for patient education details Person educated: Patient Education method: Explanation, Demonstration, and Handouts Education comprehension: verbalized understanding and returned demonstration   HOME EXERCISE PROGRAM:  Access Code: [email or web address] URL: [email or web address] Date: 08/18/2023 Prepared by: Venetia Endo  Exercises - Hooklying Lumbar Traction  - 3 x daily - 7 x weekly - 2 sets - 10 reps - Supine Double Knee to Chest  - 5-6 x daily - 7 x weekly - 1 sets - 10 reps - 1sec hold - Supine Lower Trunk Rotation  - 2 x daily - 7 x weekly - 2 sets - 10 reps - Supine 90/90 Sciatic Nerve Glide with Knee Flexion/Extension  - 2 x daily - 7 x weekly - 2 sets - 10 reps - 1-2sec hold - Supine Posterior Pelvic Tilt  - 2 x daily - 7 x weekly - 2 sets - 10 reps - Supine Bridge  - 1 x daily - 7 x weekly - 2 sets - 10 reps   ASSESSMENT:  CLINICAL IMPRESSION: Patient does appear to be tolerating transferring, bed mobility, and short-distance gait better during time in clinic. She has ongoing L lower quarter pain with paresthesia/referred pain down L lower limb. Pain is largely along L iliolumbar and  L lower lumbosacral region today. We completed initial trial of dry needling to treat via trigger point and radiculopathic model; pt tolerates treatment well. She demonstrates modest improvement in forward flexion and bilateral rotation AROM. Pt has remaining deficits in thoracolumbar AROM, posterior chain soft tissue mobility/dural and neural mobility, decreased hip and LLE strength, and sensitivity to palpation along L>R lumbar paraspinals and gluteal musculature. Pt  will continue to benefit from skilled PT services to address deficits and improve function.   OBJECTIVE IMPAIRMENTS: decreased activity tolerance, difficulty walking, decreased ROM, decreased strength, hypomobility, impaired flexibility, and pain.   ACTIVITY LIMITATIONS: standing, squatting, stairs, transfers, bed mobility, and locomotion level  PARTICIPATION LIMITATIONS: meal prep, cleaning, driving, shopping, and community activity  PERSONAL FACTORS: Age, Past/current experiences, and 3+ comorbidities: (depression, HTN, GERD, anxiety)  are also affecting patient's functional outcome.   REHAB POTENTIAL: Good  CLINICAL DECISION MAKING: Evolving/moderate complexity  EVALUATION COMPLEXITY: Moderate   GOALS: Goals reviewed with patient? Yes  SHORT TERM GOALS: Target date: 08/13/2023  Pt will be independent with HEP in order to improve strength and decrease back  pain to improve pain-free function at home and work. Baseline: 07/22/23: Baseline HEP initiated.  Goal status: INITIAL   LONG TERM GOALS: Target date: 09/03/2023  Pt will increase FOTO to at least 50 to demonstrate significant improvement in function at home and work related to back pain  Baseline: 07/22/23: 25 Goal status: INITIAL  2.  Pt will decrease worst back pain by at least 2 points on the NPRS in order to demonstrate clinically significant reduction in back pain. Baseline: 07/22/23: 8-9/10 Goal status: INITIAL  3.  Patient will have full thoracolumbar AROM without reproduction of pain as needed for reaching items on ground, household chores, bending.     Baseline: 07/22/23: Pain and motion loss in all directions Goal status: INITIAL  4.  Pt will demonstrate negotiation of full flight of steps with reciprocal pattern and no movement deviations and no increase in lower quarter pain as needed for community-level mobility  Baseline: 07/22/23: Significant limitation with stair negotiation Goal status: INITIAL  5.  Pt  will perform sit to stand with no UE assist and no reproduction of pain as needed for self-care and home-level mobility  Baseline: 07/22/23: Significant pain behaviors and Gower's sign when getting up from seat Goal status: INITIAL   PLAN: PT FREQUENCY: 1-2x/week  PT DURATION: 6 weeks  PLANNED INTERVENTIONS: Therapeutic exercises, Therapeutic activity, Neuromuscular re-education, Balance training, Gait training, Patient/Family education, Self Care, Joint mobilization, Joint manipulation, Vestibular training, Canalith repositioning, Orthotic/Fit training, DME instructions, Dry Needling, Electrical stimulation, Spinal manipulation, Spinal mobilization, Cryotherapy, Moist heat, Taping, Traction, Ultrasound, Ionotophoresis 4mg /ml Dexamethasone , Manual therapy, and Re-evaluation.  PLAN FOR NEXT SESSION: Manual lumbar traction, repeated flexion/flexion principle MDT, posterior chain mobility/nerve flossing, manual techniques for soft tissue sensitivity prn.    Venetia Endo, PT, DPT #[id number]  Venetia ONEIDA Endo, PT 08/18/2023, 2:06 PM

## 2023-08-20 ENCOUNTER — Ambulatory Visit: Payer: Managed Care, Other (non HMO) | Attending: Internal Medicine | Admitting: Physical Therapy

## 2023-08-20 ENCOUNTER — Encounter: Payer: Self-pay | Admitting: Physical Therapy

## 2023-08-20 DIAGNOSIS — M5459 Other low back pain: Secondary | ICD-10-CM | POA: Diagnosis not present

## 2023-08-20 DIAGNOSIS — M6281 Muscle weakness (generalized): Secondary | ICD-10-CM | POA: Insufficient documentation

## 2023-08-20 DIAGNOSIS — M5416 Radiculopathy, lumbar region: Secondary | ICD-10-CM | POA: Diagnosis not present

## 2023-08-20 NOTE — Therapy (Signed)
 OUTPATIENT PHYSICAL THERAPY TREATMENT   Patient Name: [name] MRN: [id number] DOB:[birth year], 73 y.o., female Today's Date: 08/20/2023   END OF SESSION:  PT End of Session - 08/20/23 1258     Visit Number 6    Number of Visits 13    Date for PT Re-Evaluation 09/03/23    Authorization Type [employer]/Medicare 2024    PT Start Time 1300    PT Stop Time 1343    PT Time Calculation (min) 43 min    Activity Tolerance Patient limited by pain    Behavior During Therapy [facility] for tasks assessed/performed               Past Medical History:  Diagnosis Date   Actinic keratosis    Allergy    Anxiety    Breast mass 10/07/2016   left 2:00   Depression    GERD (gastroesophageal reflux disease)    Hypertension    Past Surgical History:  Procedure Laterality Date   ABDOMINAL HYSTERECTOMY  1988   complete; due to fibroids   BLADDER REPAIR  2007   BREAST BIOPSY Left 11/25/2022   US  Axilla Bx, Path pending   CHOLECYSTECTOMY  2003   COLONOSCOPY  04/16/2009   KNEE SURGERY Left 2012   TONSILLECTOMY  1960   Patient Active Problem List   Diagnosis Date Noted   Annual physical exam 05/30/2022   Depression, recurrent (HCC) 05/30/2022   Primary hypertension 05/30/2022   Screening mammogram for breast cancer 05/30/2022   Gastroesophageal reflux disease without esophagitis 07/18/2021    PCP: Emilio Kelly DASEN, FNP  REFERRING PROVIDER: Dreama Ozell Mutters*  REFERRING DIAG:  M54.16 (ICD-10-CM) - Radiculopathy, lumbar region    RATIONALE FOR EVALUATION AND TREATMENT: Rehabilitation  THERAPY DIAG: Other low back pain  Radiculopathy, lumbar region  Muscle weakness (generalized)  ONSET DATE: Mid-October 2024;   FOLLOW-UP APPT SCHEDULED WITH REFERRING PROVIDER: Yes    PERTINENT HISTORY: Patient is a 73 year old female with Hx of low back/R hip pain, no radiographic explanation of hip pain; current referral for lumbar radiculopathy. Pt reports Hx of bone spur in R hip  that doesn't typically bother her that much; she had injection in it last year. Pt reports pain across her back and down her LLE; numbness down L leg down to L foot. Pt reports feeling unbalanced and unstable on her feet. Pt reports she used to walk up to 10,000 steps/day; she reports she has not done this lately. Symptoms began mid-October when she was bathing her dog in shower; she was bent over bathing dog and felt notable pain upon standing afterward. Pt is using Gabapentin for neuralgia management; OxyContin  for severe pain episodes about 3 times total now. Pt reports swelling in ankles after prolonged standing, no history of peripheral vascular disease.   PAIN:    Pain Intensity: Present: 5-6/10, Worst: 8-9/10 Pain location: Across low back, pain down LLE, heaviness into L thigh Pain Quality: sharp and dull ; numbing and tingling Radiating: Yes, down LLE Numbness/Tingling: Yes; down LLE to toes, into great toe especially  Focal Weakness: Yes; LLE weakness, intermittent difficulty weightbearing Aggravating factors: standing any length of time, sit to stand after prolonged sitting, turning over in bed, transferring from car, up/down stairs  Relieving factors: Gabapentin, heating pad, TENS, stretching, heat 24-hour pain behavior: Worse in PM previously; no 24-hour pain pattern now  How long can you stand: 30 minutes standing in kitchen tolerated; then, she has to lie down/get off feet  History of prior back injury, pain, surgery, or therapy: Yes; Hx of chiropractic for this episode of pain initially   Imaging: Yes ; Valdemar Sanders, Eric Calamity, MD   EXAM: Magnetic resonance imaging, spinal canal and contents, lumbar, without contrast material. DATE: 07/06/2023 11:24 AM ACCESSION: [id number] UN DICTATED: 07/06/2023 11:37 AM   --Multilevel degenerative changes with varying degrees of spinal canal and neuroforaminal stenosis, most pronounced at L4-L5 and L5-S1.  --Prominent  masslike signal abnormality along middle/lower pole of right kidney, indeterminate and may reflect a lipomatous lesion or artifact from adjacent bowel. Recommend dedicated evaluation with contrast enhanced CT to rule out possibility of mass lesion.     Finazzo, Josephine Joann, [place]   CT Scan - Renal 07/21/23 EXAM: CT RENAL MASS (RENAL MASS PROTOCOL) ACCESSION: [id number] UN   CLINICAL INDICATION: follow up MRI finding - N28.89 - Renal mass  FINDINGS:  LOWER CHEST: Left lower lobe pulmonary nodules measuring up to 0.3 cm (11:17, 11:14).  LIVER: Normal liver contour. No focal liver lesions.  BILIARY: Gallbladder is surgically absent. Common bile duct dilation measuring up to 0.8 cm (13:39), likely secondary to postcholecystectomy state.  SPLEEN: Subcentimeter splenic hypodensity (11:19), too small to further characterize by CT.  PANCREAS: Normal pancreatic contour. No focal lesions. No ductal dilation.  ADRENAL GLANDS: Normal appearance of the adrenal glands.  KIDNEYS: Right Kidney: Right kidney exophytic interpolar anterior heterogeneous enhancing fat-containing mass without calcifications measuring 9.4 x 7.2 x 7.2 cm (11:54, 13:41). No obstructing calculi. No hydronephrosis. Left Kidney: Homogeneous enhancement. No obstructing calculi. No hydronephrosis. No suspicious enhancing mass.  GI TRACT: No findings of bowel obstruction or acute inflammation.  PERITONEUM, RETROPERITONEUM AND MESENTERY: No free air. No ascites. No fluid collection.  LYMPH NODES: No adenopathy.  VESSELS: Hepatic and portal veins are patent. Normal caliber aorta.  BONES and SOFT TISSUES: No aggressive osseous lesions. Small fat-containing umbilical hernia.  IMPRESSION: -Fat-containing right renal lesion measuring up to 9.4 cm with imaging characteristics most consistent with a benign angiomyolipoma. However, given size, lesion carries risk for spontaneous bleeding and retroperitoneal hemorrhage.  --Left  basilar pulmonary micronodules. If patient is at increased risk for lung cancer, consider follow-up chest CT without contrast in 12 months.     Red flags: Negative for bowel/bladder changes, saddle paresthesia, personal history of cancer, h/o spinal tumors, h/o compression fx, h/o abdominal aneurysm, abdominal pain, chills/fever, night sweats, nausea, vomiting, unrelenting pain, first onset of insidious LBP <20 y/o  -Difficulty with urinary frequency/urgency, urinary incontinence   Living Environment Lives with: lives with their spouse; husband travels for work; pt is at home alone Tues/Wed/Thur Lives in: House/apartment; 2-level home, oceanographer bedroom Stairs: Yes: Internal: 13 steps; on left going up and External: 3 steps; can reach both handrails; 4 steps at backdoor Has following equipment at home: None  Prior level of function: Independent  Occupational demands: Retired  Presenter, Broadcasting: Social Research Officer, Government, cooking, walking routine  Patient Goals: Get back to normal activities    OBJECTIVE:   GAIT: Distance walked: 50 ft Assistive device utilized: None Level of assistance: Complete Independence Comments: Decreased arm swing/trunk rotation, slow velocity and step cadence  Posture: Lumbar lordosis: WNL Mild rounded shoulders  Iliac crest height: Equal bilaterally Lumbar lateral shift: Negative Guarded posture, pt maintains pressure through hands in sitting  AROM AROM (Normal range in degrees) AROM  07/22/23  Lumbar   Flexion (65) 50%* (pain at waist L>R)  Extension (30) 25%* (numbness into toes)  Right lateral flexion (25) 75%*  Left lateral  flexion (25) 75% (mild symptoms)  Right rotation (30) 25%*  Left rotation (30) 25%      Hip Right Left  Flexion (125) 120* 120*  Extension (15)    Abduction (40)    Adduction     Internal Rotation (45) 30 20*  External Rotation (45) 45 45*      (* = pain; Blank rows = not tested)  LE MMT: MMT (out of 5) Right 07/22/23  Left 07/22/23  Hip flexion 4* 3+*  Hip extension    Hip abduction 4- 4-  Hip adduction    Hip internal rotation    Hip external rotation    Knee flexion 5 4-*  Knee extension 5 3+*  Ankle dorsiflexion 5 3+*  Ankle plantarflexion    Ankle inversion    Ankle eversion    (* = pain; Blank rows = not tested)  Sensation Grossly intact to light touch throughout bilateral LEs as determined by testing dermatomes L2-S2. Proprioception, stereognosis, and hot/cold testing deferred on this date.  Reflexes R/L Knee Jerk (L3/4): 2+/2+  Ankle Jerk (S1/2): 2+/1+   Muscle Length Hamstrings: R: Positive L: Positive Ely (quadriceps): R: Not examined L: Not examined Thomas (hip flexors): R: Not examined L: Not examined   Palpation Location Right Left         Lumbar paraspinals 1 1  Quadratus Lumborum    Gluteus Maximus 1 1  Gluteus Medius  1  Deep hip external rotators  0  PSIS  1  Fortin's Area (SIJ)    Greater Trochanter    (Blank rows = not tested) Graded on 0-4 scale (0 = no pain, 1 = pain, 2 = pain with wincing/grimacing/flinching, 3 = pain with withdrawal, 4 = unwilling to allow palpation)  Passive Accessory Intervertebral Motion Deferred  Special Tests Lumbar Radiculopathy and Discogenic: Centralization and Peripheralization (SN 92, -LR 0.12): Repeated flexion in lying; pressure in back during, no worse after Slump (SN 83, -LR 0.32): R: Positive L: Positive SLR (SN 92, -LR 0.29): R: Positive L:  Positive   Lumbar Foraminal Stenosis: Lumbar quadrant (SN 70): R: Negative L: Positive     TODAY'S TREATMENT: DATE: 08/20/2023   SUBJECTIVE STATEMENT:   Pt reports feeling better than last time she as here. She reports still having discomfort in the same region. She reports low intensity of numbness/tingling down LLE at this time. Patient reports compliance with HEP.     Manual Therapy - for symptom modulation, soft tissue sensitivity and mobility, joint mobility, ROM     Prone lying with 2 pillows under pelvis to prevent lumbar hyperextension: STM and IASTM with Hypervolt along L L3-S1 longissimus lumborum, L QL, L gluteal musculature; x 15 minutes   *not today* Manual lumbar traction in supine with Mulligan belt; 10 sec on, 10 sec off; x 8 minutes for nerve root decompression and symptom modulation L L4-S1 segmental distraction distraction; 2-3 sec bouts x 20 repetitions   Trigger Point Dry Needling  Subsequent Treatment: Instructions provided previously at initial dry needling treatment.   Patient Verbal Consent Given: Yes Education Handout Provided: Yes Muscles Treated: L multifidus at L4 and L5 with 0.30 x 70 single needle placements, L gluteus maximus with 0.30 x 60 needle placement   Electrical Stimulation Performed: No Treatment Response/Outcome: Remaining L iliolumbar pain, gradually improving intensity of L flank pain and LLE paresthesias     Therapeutic Exercise - for improved soft tissue flexibility and extensibility as needed for ROM, dural/neural mobility as needed  to reduce neuralgias/paresthesias,    AROM Lumbar flexion >75%* (pain along L gluteal region) Lumbar extension 25%* (pain along L PSIS region) Lateral flexion: R WNL* , L 75% Thoracolumbar rotation: R 75%*, L 75%  Lower trunk rotations; 1x10 alternating R/L Supine figure-4 piriformis stretch, BLE; 3 x 30 sec Open book; 1 x 10, lying on either side   Posterior pelvic tilt to bridge; 2x10 Posterior pelvic tilt isometric wit alternating hip flexion to 90 deg; 2x10 alternating R/L  Seated blue physioball rollout; 3-way (anterior and anterolateral R/L); x 5 ea dir    PATIENT EDUCATION: HEP reviewed.     *not today* Posterior pelvic tilt; 2x10 Double knee to chest, with clinician overpressure; 2x10, 1 sec   -fleeting increase in pressure along L paraspinal region; better after  MHP (unbilled) utilized post-treatment for analgesic effect and improved soft tissue  extensibility, along low back with pt sitting in chair with upright lumbar support; x  5 minutes. Double knee to chest; 1x10, 1 sec Sciatic nerve flossing, hooklying; 2x10, 1 sec hold     PATIENT EDUCATION:  Education details: see above for patient education details Person educated: Patient Education method: Explanation, Demonstration, and Handouts Education comprehension: verbalized understanding and returned demonstration   HOME EXERCISE PROGRAM:  Access Code: [email or web address] URL: [email or web address] Date: 08/18/2023 Prepared by: Venetia Endo  Exercises - Hooklying Lumbar Traction  - 3 x daily - 7 x weekly - 2 sets - 10 reps - Supine Double Knee to Chest  - 5-6 x daily - 7 x weekly - 1 sets - 10 reps - 1sec hold - Supine Lower Trunk Rotation  - 2 x daily - 7 x weekly - 2 sets - 10 reps - Supine 90/90 Sciatic Nerve Glide with Knee Flexion/Extension  - 2 x daily - 7 x weekly - 2 sets - 10 reps - 1-2sec hold - Supine Posterior Pelvic Tilt  - 2 x daily - 7 x weekly - 2 sets - 10 reps - Supine Bridge  - 1 x daily - 7 x weekly - 2 sets - 10 reps   ASSESSMENT:  CLINICAL IMPRESSION: Patient has improvement in intensity of L lower limb referred symptoms/paresthesias. Pt demonstrates improved ability to perform bed mobility tasks e.g. rolling and supine to sitting. Patient exhibits modestly improved thoracolumbar AROM compared to baseline. Pt still has intermittent L iliolumbar pain with positioning on table and transfers (e.g. sit to stand, moving from quadruped to prone position). Pt has remaining deficits in thoracolumbar AROM, posterior chain soft tissue mobility/dural and neural mobility, decreased hip and LLE strength, and sensitivity to palpation along L>R lumbar paraspinals and gluteal musculature. Pt will continue to benefit from skilled PT services to address deficits and improve function.   OBJECTIVE IMPAIRMENTS: decreased activity tolerance, difficulty walking,  decreased ROM, decreased strength, hypomobility, impaired flexibility, and pain.   ACTIVITY LIMITATIONS: standing, squatting, stairs, transfers, bed mobility, and locomotion level  PARTICIPATION LIMITATIONS: meal prep, cleaning, driving, shopping, and community activity  PERSONAL FACTORS: Age, Past/current experiences, and 3+ comorbidities: (depression, HTN, GERD, anxiety)  are also affecting patient's functional outcome.   REHAB POTENTIAL: Good  CLINICAL DECISION MAKING: Evolving/moderate complexity  EVALUATION COMPLEXITY: Moderate   GOALS: Goals reviewed with patient? Yes  SHORT TERM GOALS: Target date: 08/13/2023  Pt will be independent with HEP in order to improve strength and decrease back pain to improve pain-free function at home and work. Baseline: 07/22/23: Baseline HEP initiated.  Goal status: INITIAL   LONG TERM GOALS:  Target date: 09/03/2023  Pt will increase FOTO to at least 50 to demonstrate significant improvement in function at home and work related to back pain  Baseline: 07/22/23: 25 Goal status: INITIAL  2.  Pt will decrease worst back pain by at least 2 points on the NPRS in order to demonstrate clinically significant reduction in back pain. Baseline: 07/22/23: 8-9/10 Goal status: INITIAL  3.  Patient will have full thoracolumbar AROM without reproduction of pain as needed for reaching items on ground, household chores, bending.     Baseline: 07/22/23: Pain and motion loss in all directions Goal status: INITIAL  4.  Pt will demonstrate negotiation of full flight of steps with reciprocal pattern and no movement deviations and no increase in lower quarter pain as needed for community-level mobility  Baseline: 07/22/23: Significant limitation with stair negotiation Goal status: INITIAL  5.  Pt will perform sit to stand with no UE assist and no reproduction of pain as needed for self-care and home-level mobility  Baseline: 07/22/23: Significant pain behaviors and  Gower's sign when getting up from seat Goal status: INITIAL   PLAN: PT FREQUENCY: 1-2x/week  PT DURATION: 6 weeks  PLANNED INTERVENTIONS: Therapeutic exercises, Therapeutic activity, Neuromuscular re-education, Balance training, Gait training, Patient/Family education, Self Care, Joint mobilization, Joint manipulation, Vestibular training, Canalith repositioning, Orthotic/Fit training, DME instructions, Dry Needling, Electrical stimulation, Spinal manipulation, Spinal mobilization, Cryotherapy, Moist heat, Taping, Traction, Ultrasound, Ionotophoresis 4mg /ml Dexamethasone , Manual therapy, and Re-evaluation.  PLAN FOR NEXT SESSION: Manual lumbar traction, repeated flexion/flexion principle MDT, posterior chain mobility/nerve flossing, manual techniques for soft tissue sensitivity prn.    Venetia Endo, PT, DPT #[id number]  Venetia ONEIDA Endo, PT 08/20/2023, 1:00 PM

## 2023-08-24 ENCOUNTER — Other Ambulatory Visit: Payer: Self-pay | Admitting: Family Medicine

## 2023-08-24 ENCOUNTER — Encounter: Payer: Self-pay | Admitting: Urology

## 2023-08-24 DIAGNOSIS — F339 Major depressive disorder, recurrent, unspecified: Secondary | ICD-10-CM

## 2023-08-25 ENCOUNTER — Ambulatory Visit: Payer: Medicare Other | Admitting: Physical Therapy

## 2023-08-25 ENCOUNTER — Encounter: Payer: Self-pay | Admitting: Physical Therapy

## 2023-08-25 DIAGNOSIS — M5416 Radiculopathy, lumbar region: Secondary | ICD-10-CM

## 2023-08-25 DIAGNOSIS — M5459 Other low back pain: Secondary | ICD-10-CM

## 2023-08-25 DIAGNOSIS — M6281 Muscle weakness (generalized): Secondary | ICD-10-CM

## 2023-08-25 NOTE — Therapy (Signed)
 OUTPATIENT PHYSICAL THERAPY TREATMENT   Patient Name: [name] MRN: [id number] DOB:[birth year], 73 y.o., female Today's Date: 08/25/2023   END OF SESSION:  PT End of Session - 08/25/23 1258     Visit Number 7    Number of Visits 13    Date for PT Re-Evaluation 09/03/23    Authorization Type [employer]/Medicare [id number]    PT Start Time 1259    PT Stop Time 1353    PT Time Calculation (min) 54 min    Activity Tolerance Patient limited by pain    Behavior During Therapy [facility] for tasks assessed/performed             Past Medical History:  Diagnosis Date   Actinic keratosis    Allergy    Anxiety    Breast mass 10/07/2016   left 2:00   Depression    GERD (gastroesophageal reflux disease)    Hypertension    Past Surgical History:  Procedure Laterality Date   ABDOMINAL HYSTERECTOMY  1988   complete; due to fibroids   BLADDER REPAIR  2007   BREAST BIOPSY Left 11/25/2022   US  Axilla Bx, Path pending   CHOLECYSTECTOMY  2003   COLONOSCOPY  04/16/2009   KNEE SURGERY Left 2012   TONSILLECTOMY  1960   Patient Active Problem List   Diagnosis Date Noted   Annual physical exam 05/30/2022   Depression, recurrent (HCC) 05/30/2022   Primary hypertension 05/30/2022   Screening mammogram for breast cancer 05/30/2022   Gastroesophageal reflux disease without esophagitis 07/18/2021    PCP: Emilio Kelly DASEN, FNP  REFERRING PROVIDER: Dreama Ozell Mutters*  REFERRING DIAG:  M54.16 (ICD-10-CM) - Radiculopathy, lumbar region    RATIONALE FOR EVALUATION AND TREATMENT: Rehabilitation  THERAPY DIAG: Other low back pain  Radiculopathy, lumbar region  Muscle weakness (generalized)  ONSET DATE: Mid-October 2024;   FOLLOW-UP APPT SCHEDULED WITH REFERRING PROVIDER: Yes    PERTINENT HISTORY: Patient is a 73 year old female with Hx of low back/R hip pain, no radiographic explanation of hip pain; current referral for lumbar radiculopathy. Pt reports Hx of bone spur in R hip that  doesn't typically bother her that much; she had injection in it last year. Pt reports pain across her back and down her LLE; numbness down L leg down to L foot. Pt reports feeling unbalanced and unstable on her feet. Pt reports she used to walk up to 10,000 steps/day; she reports she has not done this lately. Symptoms began mid-October when she was bathing her dog in shower; she was bent over bathing dog and felt notable pain upon standing afterward. Pt is using Gabapentin for neuralgia management; OxyContin  for severe pain episodes about 3 times total now. Pt reports swelling in ankles after prolonged standing, no history of peripheral vascular disease.   PAIN:    Pain Intensity: Present: 5-6/10, Worst: 8-9/10 Pain location: Across low back, pain down LLE, heaviness into L thigh Pain Quality: sharp and dull ; numbing and tingling Radiating: Yes, down LLE Numbness/Tingling: Yes; down LLE to toes, into great toe especially  Focal Weakness: Yes; LLE weakness, intermittent difficulty weightbearing Aggravating factors: standing any length of time, sit to stand after prolonged sitting, turning over in bed, transferring from car, up/down stairs  Relieving factors: Gabapentin, heating pad, TENS, stretching, heat 24-hour pain behavior: Worse in PM previously; no 24-hour pain pattern now  How long can you stand: 30 minutes standing in kitchen tolerated; then, she has to lie down/get off feet  History of  prior back injury, pain, surgery, or therapy: Yes; Hx of chiropractic for this episode of pain initially   Imaging: Yes ; Valdemar Sanders, Eric Calamity, MD   EXAM: Magnetic resonance imaging, spinal canal and contents, lumbar, without contrast material. DATE: 07/06/2023 11:24 AM ACCESSION: [id number] UN DICTATED: 07/06/2023 11:37 AM   --Multilevel degenerative changes with varying degrees of spinal canal and neuroforaminal stenosis, most pronounced at L4-L5 and L5-S1.  --Prominent masslike  signal abnormality along middle/lower pole of right kidney, indeterminate and may reflect a lipomatous lesion or artifact from adjacent bowel. Recommend dedicated evaluation with contrast enhanced CT to rule out possibility of mass lesion.     Finazzo, Josephine Joann, [place]   CT Scan - Renal 07/21/23 EXAM: CT RENAL MASS (RENAL MASS PROTOCOL) ACCESSION: [id number] UN   CLINICAL INDICATION: follow up MRI finding - N28.89 - Renal mass  FINDINGS:  LOWER CHEST: Left lower lobe pulmonary nodules measuring up to 0.3 cm (11:17, 11:14).  LIVER: Normal liver contour. No focal liver lesions.  BILIARY: Gallbladder is surgically absent. Common bile duct dilation measuring up to 0.8 cm (13:39), likely secondary to postcholecystectomy state.  SPLEEN: Subcentimeter splenic hypodensity (11:19), too small to further characterize by CT.  PANCREAS: Normal pancreatic contour. No focal lesions. No ductal dilation.  ADRENAL GLANDS: Normal appearance of the adrenal glands.  KIDNEYS: Right Kidney: Right kidney exophytic interpolar anterior heterogeneous enhancing fat-containing mass without calcifications measuring 9.4 x 7.2 x 7.2 cm (11:54, 13:41). No obstructing calculi. No hydronephrosis. Left Kidney: Homogeneous enhancement. No obstructing calculi. No hydronephrosis. No suspicious enhancing mass.  GI TRACT: No findings of bowel obstruction or acute inflammation.  PERITONEUM, RETROPERITONEUM AND MESENTERY: No free air. No ascites. No fluid collection.  LYMPH NODES: No adenopathy.  VESSELS: Hepatic and portal veins are patent. Normal caliber aorta.  BONES and SOFT TISSUES: No aggressive osseous lesions. Small fat-containing umbilical hernia.  IMPRESSION: -Fat-containing right renal lesion measuring up to 9.4 cm with imaging characteristics most consistent with a benign angiomyolipoma. However, given size, lesion carries risk for spontaneous bleeding and retroperitoneal hemorrhage.  --Left basilar  pulmonary micronodules. If patient is at increased risk for lung cancer, consider follow-up chest CT without contrast in 12 months.     Red flags: Negative for bowel/bladder changes, saddle paresthesia, personal history of cancer, h/o spinal tumors, h/o compression fx, h/o abdominal aneurysm, abdominal pain, chills/fever, night sweats, nausea, vomiting, unrelenting pain, first onset of insidious LBP <20 y/o  -Difficulty with urinary frequency/urgency, urinary incontinence   Living Environment Lives with: lives with their spouse; husband travels for work; pt is at home alone Tues/Wed/Thur Lives in: House/apartment; 2-level home, oceanographer bedroom Stairs: Yes: Internal: 13 steps; on left going up and External: 3 steps; can reach both handrails; 4 steps at backdoor Has following equipment at home: None  Prior level of function: Independent  Occupational demands: Retired  Presenter, Broadcasting: Social Research Officer, Government, cooking, walking routine  Patient Goals: Get back to normal activities    OBJECTIVE:   GAIT: Distance walked: 50 ft Assistive device utilized: None Level of assistance: Complete Independence Comments: Decreased arm swing/trunk rotation, slow velocity and step cadence  Posture: Lumbar lordosis: WNL Mild rounded shoulders  Iliac crest height: Equal bilaterally Lumbar lateral shift: Negative Guarded posture, pt maintains pressure through hands in sitting  AROM AROM (Normal range in degrees) AROM  07/22/23  Lumbar   Flexion (65) 50%* (pain at waist L>R)  Extension (30) 25%* (numbness into toes)  Right lateral flexion (25) 75%*  Left lateral flexion (25)  75% (mild symptoms)  Right rotation (30) 25%*  Left rotation (30) 25%      Hip Right Left  Flexion (125) 120* 120*  Extension (15)    Abduction (40)    Adduction     Internal Rotation (45) 30 20*  External Rotation (45) 45 45*      (* = pain; Blank rows = not tested)  LE MMT: MMT (out of 5) Right 07/22/23 Left 07/22/23   Hip flexion 4* 3+*  Hip extension    Hip abduction 4- 4-  Hip adduction    Hip internal rotation    Hip external rotation    Knee flexion 5 4-*  Knee extension 5 3+*  Ankle dorsiflexion 5 3+*  Ankle plantarflexion    Ankle inversion    Ankle eversion    (* = pain; Blank rows = not tested)  Sensation Grossly intact to light touch throughout bilateral LEs as determined by testing dermatomes L2-S2. Proprioception, stereognosis, and hot/cold testing deferred on this date.  Reflexes R/L Knee Jerk (L3/4): 2+/2+  Ankle Jerk (S1/2): 2+/1+   Muscle Length Hamstrings: R: Positive L: Positive E[place] (quadriceps): R: Not examined L: Not examined Thomas (hip flexors): R: Not examined L: Not examined   Palpation Location Right Left         Lumbar paraspinals 1 1  Quadratus Lumborum    Gluteus Maximus 1 1  Gluteus Medius  1  Deep hip external rotators  0  PSIS  1  Fortin's Area (SIJ)    Greater Trochanter    (Blank rows = not tested) Graded on 0-4 scale (0 = no pain, 1 = pain, 2 = pain with wincing/grimacing/flinching, 3 = pain with withdrawal, 4 = unwilling to allow palpation)  Passive Accessory Intervertebral Motion Deferred  Special Tests Lumbar Radiculopathy and Discogenic: Centralization and Peripheralization (SN 92, -LR 0.12): Repeated flexion in lying; pressure in back during, no worse after Slump (SN 83, -LR 0.32): R: Positive L: Positive SLR (SN 92, -LR 0.29): R: Positive L:  Positive   Lumbar Foraminal Stenosis: Lumbar quadrant (SN 70): R: Negative L: Positive     TODAY'S TREATMENT: DATE: 08/25/2023   SUBJECTIVE STATEMENT:   Pt reports 6/10 pain at arrival. L>R iliolumbar pain. Patient reports compliance with HEP and keeping up frequency. Patient reports some neuropathic/neural pain into L lower limb. Pt reports pain down to her foot.     Manual Therapy - for symptom modulation, soft tissue sensitivity and mobility, joint mobility, ROM    Prone lying with 2  pillows under pelvis to prevent lumbar hyperextension: STM and IASTM with Hypervolt along L L3-S1 longissimus lumborum, L QL, L gluteal musculature; x 15 minutes   *not today* Manual lumbar traction in supine with Mulligan belt; 10 sec on, 10 sec off; x 8 minutes for nerve root decompression and symptom modulation L L4-S1 segmental distraction distraction; 2-3 sec bouts x 20 repetitions   Trigger Point Dry Needling  Prone lying with 2 pillows under pelvis to prevent lumbar hyperextension:  Subsequent Treatment: Instructions provided previously at initial dry needling treatment.   Patient Verbal Consent Given: Yes Education Handout Provided: Yes Muscles Treated: L and R multifidus at L4 and S1 with 0.30 x 70 single needle placements (kept in for electric stim) Electrical Stimulation Performed: Yes, Parameters: 3.5 mA at 5 pps frequency for 7.5 minutes, 3.5 microamps at 100 pps frequency for 7.5 minutes (15 min total) Treatment Response/Outcome: Pt has remaining pressure along L>R iliolumbar region, no  c/o referred symptoms     Therapeutic Exercise - for improved soft tissue flexibility and extensibility as needed for ROM, dural/neural mobility as needed to reduce neuralgias/paresthesias,   Double knee to chest, with clinician overpressure; 1x10, 1 sec   -fleeting increase in pressure along L paraspinal region; better after  Double knee to chest, with overpressure and rotation to R side; 1x10, 1 sec   -symptoms along buttocks L>R mainly post-repeated movement    AROM Lumbar flexion WFL* (pain along L PSIS) Lumbar extension 50%* (pain aross lumbosacral region) Lateral flexion: R WNL* , L WNL* (pain across lumbosacral region) Thoracolumbar rotation: R 75%*, L 75%   Lower trunk rotations; 1x10 alternating R/L Supine figure-4 piriformis stretch, focus on LLE today; 2 x 30 sec   PATIENT EDUCATION: HEP updated to include piriformis stretching with repeated flexion.     *not  today* Posterior pelvic tilt to bridge; 2x10 Posterior pelvic tilt isometric wit alternating hip flexion to 90 deg; 2x10 alternating R/L Seated blue physioball rollout; 3-way (anterior and anterolateral R/L); x 5 ea dir  Open book; 1 x 10, lying on either side  Posterior pelvic tilt; 2x10 MHP (unbilled) utilized post-treatment for analgesic effect and improved soft tissue extensibility, along low back with pt sitting in chair with upright lumbar support; x  5 minutes. Double knee to chest; 1x10, 1 sec Sciatic nerve flossing, hooklying; 2x10, 1 sec hold     PATIENT EDUCATION:  Education details: see above for patient education details Person educated: Patient Education method: Explanation, Demonstration, and Handouts Education comprehension: verbalized understanding and returned demonstration   HOME EXERCISE PROGRAM:  Access Code: [email or web address] URL: [email or web address] Date: 08/18/2023 Prepared by: Venetia Endo  Exercises - Hooklying Lumbar Traction  - 3 x daily - 7 x weekly - 2 sets - 10 reps - Supine Double Knee to Chest  - 5-6 x daily - 7 x weekly - 1 sets - 10 reps - 1sec hold - Supine Lower Trunk Rotation  - 2 x daily - 7 x weekly - 2 sets - 10 reps - Supine 90/90 Sciatic Nerve Glide with Knee Flexion/Extension  - 2 x daily - 7 x weekly - 2 sets - 10 reps - 1-2sec hold - Supine Posterior Pelvic Tilt  - 2 x daily - 7 x weekly - 2 sets - 10 reps - Supine Bridge  - 1 x daily - 7 x weekly - 2 sets - 10 reps   ASSESSMENT:  CLINICAL IMPRESSION: Patient demonstrates modestly improving sagittal plane ROM and improved tolerance of lateral flexion to R. Patent has ongoing pain with closing on L side (e.g. rotation/sidebend to L side). We utilized DN + e-stim today to treat via radiculopathic model for lumbosacral referred pain; needles at L4 and S1 levels bilaterally. Pt has fair response to repeated flexion with rotation to opposite side (away from side of symptoms). Pt  has ongoing c/o pressure affecting lumbosacral region. Pt does feel she is making good progress to date; we will continue to monitor for response to repeated movement program and dry needling with successive visits. Pt has remaining deficits in thoracolumbar AROM, posterior chain soft tissue mobility/dural and neural mobility, decreased hip and LLE strength, and sensitivity to palpation along L>R lumbar paraspinals and gluteal musculature. Pt will continue to benefit from skilled PT services to address deficits and improve function.   OBJECTIVE IMPAIRMENTS: decreased activity tolerance, difficulty walking, decreased ROM, decreased strength, hypomobility, impaired flexibility, and pain.   ACTIVITY LIMITATIONS: standing, squatting,  stairs, transfers, bed mobility, and locomotion level  PARTICIPATION LIMITATIONS: meal prep, cleaning, driving, shopping, and community activity  PERSONAL FACTORS: Age, Past/current experiences, and 3+ comorbidities: (depression, HTN, GERD, anxiety)  are also affecting patient's functional outcome.   REHAB POTENTIAL: Good  CLINICAL DECISION MAKING: Evolving/moderate complexity  EVALUATION COMPLEXITY: Moderate   GOALS: Goals reviewed with patient? Yes  SHORT TERM GOALS: Target date: 08/13/2023  Pt will be independent with HEP in order to improve strength and decrease back pain to improve pain-free function at home and work. Baseline: 07/22/23: Baseline HEP initiated.  Goal status: INITIAL   LONG TERM GOALS: Target date: 09/03/2023  Pt will increase FOTO to at least 50 to demonstrate significant improvement in function at home and work related to back pain  Baseline: 07/22/23: 25 Goal status: INITIAL  2.  Pt will decrease worst back pain by at least 2 points on the NPRS in order to demonstrate clinically significant reduction in back pain. Baseline: 07/22/23: 8-9/10 Goal status: INITIAL  3.  Patient will have full thoracolumbar AROM without reproduction of  pain as needed for reaching items on ground, household chores, bending.     Baseline: 07/22/23: Pain and motion loss in all directions Goal status: INITIAL  4.  Pt will demonstrate negotiation of full flight of steps with reciprocal pattern and no movement deviations and no increase in lower quarter pain as needed for community-level mobility  Baseline: 07/22/23: Significant limitation with stair negotiation Goal status: INITIAL  5.  Pt will perform sit to stand with no UE assist and no reproduction of pain as needed for self-care and home-level mobility  Baseline: 07/22/23: Significant pain behaviors and Gower's sign when getting up from seat Goal status: INITIAL   PLAN: PT FREQUENCY: 1-2x/week  PT DURATION: 6 weeks  PLANNED INTERVENTIONS: Therapeutic exercises, Therapeutic activity, Neuromuscular re-education, Balance training, Gait training, Patient/Family education, Self Care, Joint mobilization, Joint manipulation, Vestibular training, Canalith repositioning, Orthotic/Fit training, DME instructions, Dry Needling, Electrical stimulation, Spinal manipulation, Spinal mobilization, Cryotherapy, Moist heat, Taping, Traction, Ultrasound, Ionotophoresis 4mg /ml Dexamethasone , Manual therapy, and Re-evaluation.  PLAN FOR NEXT SESSION: Manual lumbar traction, repeated flexion/flexion principle MDT, posterior chain mobility/nerve flossing, manual techniques for soft tissue sensitivity prn. Modalities in vicinity of costosternal angle contraindicated due to known benign renal mass/angiomyolipoma.    Venetia Endo, PT, DPT #[id number]  Venetia ONEIDA Endo, PT 08/25/2023, 2:01 PM

## 2023-08-25 NOTE — Telephone Encounter (Signed)
 Pt calling stating that she would like to get this taking care of while she is still on her husband insurance

## 2023-08-27 ENCOUNTER — Ambulatory Visit: Payer: Medicare Other | Admitting: Physical Therapy

## 2023-08-27 ENCOUNTER — Ambulatory Visit: Payer: Medicare Other | Admitting: Urology

## 2023-08-27 DIAGNOSIS — M5416 Radiculopathy, lumbar region: Secondary | ICD-10-CM | POA: Diagnosis not present

## 2023-08-27 DIAGNOSIS — M6281 Muscle weakness (generalized): Secondary | ICD-10-CM | POA: Diagnosis not present

## 2023-08-27 DIAGNOSIS — M5459 Other low back pain: Secondary | ICD-10-CM

## 2023-08-27 NOTE — Therapy (Signed)
 OUTPATIENT PHYSICAL THERAPY TREATMENT   Patient Name: [name] MRN: [id number] DOB:Jun 18, 1951, 73 y.o., female Today's Date: 08/27/2023   END OF SESSION:  PT End of Session - 08/27/23 1300     Visit Number 8    Number of Visits 13    Date for PT Re-Evaluation 09/03/23    Authorization Type [employer]/Medicare 2024    PT Start Time 1300    PT Stop Time 1345    PT Time Calculation (min) 45 min    Activity Tolerance Patient limited by pain    Behavior During Therapy [facility] for tasks assessed/performed              Past Medical History:  Diagnosis Date   Actinic keratosis    Allergy    Anxiety    Breast mass 10/07/2016   left 2:00   Depression    GERD (gastroesophageal reflux disease)    Hypertension    Past Surgical History:  Procedure Laterality Date   ABDOMINAL HYSTERECTOMY  1988   complete; due to fibroids   BLADDER REPAIR  2007   BREAST BIOPSY Left 11/25/2022   US  Axilla Bx, Path pending   CHOLECYSTECTOMY  2003   COLONOSCOPY  04/16/2009   KNEE SURGERY Left 2012   TONSILLECTOMY  1960   Patient Active Problem List   Diagnosis Date Noted   Annual physical exam 05/30/2022   Depression, recurrent (HCC) 05/30/2022   Primary hypertension 05/30/2022   Screening mammogram for breast cancer 05/30/2022   Gastroesophageal reflux disease without esophagitis 07/18/2021    PCP: Emilio Kelly DASEN, FNP  REFERRING PROVIDER: Dreama Ozell Mutters*  REFERRING DIAG:  M54.16 (ICD-10-CM) - Radiculopathy, lumbar region    RATIONALE FOR EVALUATION AND TREATMENT: Rehabilitation  THERAPY DIAG: Other low back pain  Radiculopathy, lumbar region  Muscle weakness (generalized)  ONSET DATE: Mid-October 2024;   FOLLOW-UP APPT SCHEDULED WITH REFERRING PROVIDER: Yes    PERTINENT HISTORY: Patient is a 73 year old female with Hx of low back/R hip pain, no radiographic explanation of hip pain; current referral for lumbar radiculopathy. Pt reports Hx of bone spur in R hip  that doesn't typically bother her that much; she had injection in it last year. Pt reports pain across her back and down her LLE; numbness down L leg down to L foot. Pt reports feeling unbalanced and unstable on her feet. Pt reports she used to walk up to 10,000 steps/day; she reports she has not done this lately. Symptoms began mid-October when she was bathing her dog in shower; she was bent over bathing dog and felt notable pain upon standing afterward. Pt is using Gabapentin for neuralgia management; OxyContin  for severe pain episodes about 3 times total now. Pt reports swelling in ankles after prolonged standing, no history of peripheral vascular disease.   PAIN:    Pain Intensity: Present: 5-6/10, Worst: 8-9/10 Pain location: Across low back, pain down LLE, heaviness into L thigh Pain Quality: sharp and dull ; numbing and tingling Radiating: Yes, down LLE Numbness/Tingling: Yes; down LLE to toes, into great toe especially  Focal Weakness: Yes; LLE weakness, intermittent difficulty weightbearing Aggravating factors: standing any length of time, sit to stand after prolonged sitting, turning over in bed, transferring from car, up/down stairs  Relieving factors: Gabapentin, heating pad, TENS, stretching, heat 24-hour pain behavior: Worse in PM previously; no 24-hour pain pattern now  How long can you stand: 30 minutes standing in kitchen tolerated; then, she has to lie down/get off feet  History  of prior back injury, pain, surgery, or therapy: Yes; Hx of chiropractic for this episode of pain initially   Imaging: Yes ; Valdemar Sanders, Eric Calamity, MD   EXAM: Magnetic resonance imaging, spinal canal and contents, lumbar, without contrast material. DATE: 07/06/2023 11:24 AM ACCESSION: [id number] UN DICTATED: 07/06/2023 11:37 AM   --Multilevel degenerative changes with varying degrees of spinal canal and neuroforaminal stenosis, most pronounced at L4-L5 and L5-S1.  --Prominent  masslike signal abnormality along middle/lower pole of right kidney, indeterminate and may reflect a lipomatous lesion or artifact from adjacent bowel. Recommend dedicated evaluation with contrast enhanced CT to rule out possibility of mass lesion.     Finazzo, Josephine Joann, [place]   CT Scan - Renal 07/21/23 EXAM: CT RENAL MASS (RENAL MASS PROTOCOL) ACCESSION: [id number] UN   CLINICAL INDICATION: follow up MRI finding - N28.89 - Renal mass  FINDINGS:  LOWER CHEST: Left lower lobe pulmonary nodules measuring up to 0.3 cm (11:17, 11:14).  LIVER: Normal liver contour. No focal liver lesions.  BILIARY: Gallbladder is surgically absent. Common bile duct dilation measuring up to 0.8 cm (13:39), likely secondary to postcholecystectomy state.  SPLEEN: Subcentimeter splenic hypodensity (11:19), too small to further characterize by CT.  PANCREAS: Normal pancreatic contour. No focal lesions. No ductal dilation.  ADRENAL GLANDS: Normal appearance of the adrenal glands.  KIDNEYS: Right Kidney: Right kidney exophytic interpolar anterior heterogeneous enhancing fat-containing mass without calcifications measuring 9.4 x 7.2 x 7.2 cm (11:54, 13:41). No obstructing calculi. No hydronephrosis. Left Kidney: Homogeneous enhancement. No obstructing calculi. No hydronephrosis. No suspicious enhancing mass.  GI TRACT: No findings of bowel obstruction or acute inflammation.  PERITONEUM, RETROPERITONEUM AND MESENTERY: No free air. No ascites. No fluid collection.  LYMPH NODES: No adenopathy.  VESSELS: Hepatic and portal veins are patent. Normal caliber aorta.  BONES and SOFT TISSUES: No aggressive osseous lesions. Small fat-containing umbilical hernia.  IMPRESSION: -Fat-containing right renal lesion measuring up to 9.4 cm with imaging characteristics most consistent with a benign angiomyolipoma. However, given size, lesion carries risk for spontaneous bleeding and retroperitoneal hemorrhage.  --Left  basilar pulmonary micronodules. If patient is at increased risk for lung cancer, consider follow-up chest CT without contrast in 12 months.     Red flags: Negative for bowel/bladder changes, saddle paresthesia, personal history of cancer, h/o spinal tumors, h/o compression fx, h/o abdominal aneurysm, abdominal pain, chills/fever, night sweats, nausea, vomiting, unrelenting pain, first onset of insidious LBP <20 y/o  -Difficulty with urinary frequency/urgency, urinary incontinence   Living Environment Lives with: lives with their spouse; husband travels for work; pt is at home alone Tues/Wed/Thur Lives in: House/apartment; 2-level home, oceanographer bedroom Stairs: Yes: Internal: 13 steps; on left going up and External: 3 steps; can reach both handrails; 4 steps at backdoor Has following equipment at home: None  Prior level of function: Independent  Occupational demands: Retired  Presenter, Broadcasting: Social Research Officer, Government, cooking, walking routine  Patient Goals: Get back to normal activities    OBJECTIVE:   GAIT: Distance walked: 50 ft Assistive device utilized: None Level of assistance: Complete Independence Comments: Decreased arm swing/trunk rotation, slow velocity and step cadence  Posture: Lumbar lordosis: WNL Mild rounded shoulders  Iliac crest height: Equal bilaterally Lumbar lateral shift: Negative Guarded posture, pt maintains pressure through hands in sitting  AROM AROM (Normal range in degrees) AROM  07/22/23  Lumbar   Flexion (65) 50%* (pain at waist L>R)  Extension (30) 25%* (numbness into toes)  Right lateral flexion (25) 75%*  Left lateral flexion (  25) 75% (mild symptoms)  Right rotation (30) 25%*  Left rotation (30) 25%      Hip Right Left  Flexion (125) 120* 120*  Extension (15)    Abduction (40)    Adduction     Internal Rotation (45) 30 20*  External Rotation (45) 45 45*      (* = pain; Blank rows = not tested)  LE MMT: MMT (out of 5) Right 07/22/23  Left 07/22/23  Hip flexion 4* 3+*  Hip extension    Hip abduction 4- 4-  Hip adduction    Hip internal rotation    Hip external rotation    Knee flexion 5 4-*  Knee extension 5 3+*  Ankle dorsiflexion 5 3+*  Ankle plantarflexion    Ankle inversion    Ankle eversion    (* = pain; Blank rows = not tested)  Sensation Grossly intact to light touch throughout bilateral LEs as determined by testing dermatomes L2-S2. Proprioception, stereognosis, and hot/cold testing deferred on this date.  Reflexes R/L Knee Jerk (L3/4): 2+/2+  Ankle Jerk (S1/2): 2+/1+   Muscle Length Hamstrings: R: Positive L: Positive Ely (quadriceps): R: Not examined L: Not examined Thomas (hip flexors): R: Not examined L: Not examined   Palpation Location Right Left         Lumbar paraspinals 1 1  Quadratus Lumborum    Gluteus Maximus 1 1  Gluteus Medius  1  Deep hip external rotators  0  PSIS  1  Fortin's Area (SIJ)    Greater Trochanter    (Blank rows = not tested) Graded on 0-4 scale (0 = no pain, 1 = pain, 2 = pain with wincing/grimacing/flinching, 3 = pain with withdrawal, 4 = unwilling to allow palpation)  Passive Accessory Intervertebral Motion Deferred  Special Tests Lumbar Radiculopathy and Discogenic: Centralization and Peripheralization (SN 92, -LR 0.12): Repeated flexion in lying; pressure in back during, no worse after Slump (SN 83, -LR 0.32): R: Positive L: Positive SLR (SN 92, -LR 0.29): R: Positive L:  Positive   Lumbar Foraminal Stenosis: Lumbar quadrant (SN 70): R: Negative L: Positive     TODAY'S TREATMENT: DATE: 08/27/2023   SUBJECTIVE STATEMENT:   Pt reports some urinary incontinence issues that seem to be more significant Tuesday night with pt having accident on bathroom floor. Pt does report history of challenges with incontinence.  Patient reports not experiencing notable pressure in her back at arrival. Patient reports some mental health issues due to anniversary of  passing of her granddaughter. Pt reports she is not having as much pain presently. 4/10 NPRS at arrival.     Manual Therapy - for symptom modulation, soft tissue sensitivity and mobility, joint mobility, ROM    Prone lying with 2 pillows under pelvis to prevent lumbar hyperextension: STM and IASTM with Hypervolt along L L3-S1 longissimus lumborum, L QL, L gluteal musculature; x 20 minutes   *not today* Manual lumbar traction in supine with Mulligan belt; 10 sec on, 10 sec off; x 8 minutes for nerve root decompression and symptom modulation L L4-S1 segmental distraction distraction; 2-3 sec bouts x 20 repetitions   Trigger Point Dry Needling  Prone lying with 2 pillows under pelvis to prevent lumbar hyperextension:  Subsequent Treatment: Instructions provided previously at initial dry needling treatment.    Patient Verbal Consent Given: Yes Education Handout Provided: Yes Muscles Treated: L multifidus at L4 and L5 with 0.30 x 70 single needle placements, L gluteus medius with 0.30 x 70  needle placement.  Electrical Stimulation Performed: No Treatment Response/Outcome: Mild post-treatment soreness, good tolerance of thoracolumbar mobility work post-treatment.      Therapeutic Exercise - for improved soft tissue flexibility and extensibility as needed for ROM, dural/neural mobility as needed to reduce neuralgias/paresthesias,    Double knee to chest, with overpressure and rotation to R side; 1x10, 1 sec   -remaining pressure along low back post-treatment; minimal pain in lying   Lower trunk rotations; 1x10 alternating R/L Open book; 1 x 10, lying on either side  Supine figure-4 piriformis stretch, focus on LLE today; 2 x 30 sec   PATIENT EDUCATION: HEP reviewed; we discussed potential for use of pelvic health PT to address incontinence and continued follow-up with     *not today* Posterior pelvic tilt to bridge; 2x10 Posterior pelvic tilt isometric wit alternating hip  flexion to 90 deg; 2x10 alternating R/L Seated blue physioball rollout; 3-way (anterior and anterolateral R/L); x 5 ea dir  Posterior pelvic tilt; 2x10 MHP (unbilled) utilized post-treatment for analgesic effect and improved soft tissue extensibility, along low back with pt sitting in chair with upright lumbar support; x  5 minutes. Double knee to chest; 1x10, 1 sec Sciatic nerve flossing, hooklying; 2x10, 1 sec hold     PATIENT EDUCATION:  Education details: see above for patient education details Person educated: Patient Education method: Explanation, Demonstration, and Handouts Education comprehension: verbalized understanding and returned demonstration   HOME EXERCISE PROGRAM:  Access Code: [email or web address] URL: [email or web address] Date: 08/18/2023 Prepared by: Venetia Endo  Exercises - Hooklying Lumbar Traction  - 3 x daily - 7 x weekly - 2 sets - 10 reps - Supine Double Knee to Chest  - 5-6 x daily - 7 x weekly - 1 sets - 10 reps - 1sec hold - Supine Lower Trunk Rotation  - 2 x daily - 7 x weekly - 2 sets - 10 reps - Supine 90/90 Sciatic Nerve Glide with Knee Flexion/Extension  - 2 x daily - 7 x weekly - 2 sets - 10 reps - 1-2sec hold - Supine Posterior Pelvic Tilt  - 2 x daily - 7 x weekly - 2 sets - 10 reps - Supine Bridge  - 1 x daily - 7 x weekly - 2 sets - 10 reps   ASSESSMENT:  CLINICAL IMPRESSION: Patient has responded well with addition of dry needling with repeated movement program in regard to lower quarter symptoms and NPRS. She has experienced some episodic incontinence with difficulty with bladder urgency and control Tuesday evening. This can be associated with large benign growth on patient's kidney. We are going to ensure any targeted treatment is strictly along lumbosacral region and continue to monitor for any worsening of incontinence. Pt does exhibit vastly improved thoracolumbar AROM and improved ability to perform transferring and bed mobility.  Pt has remaining deficits in thoracolumbar AROM, posterior chain soft tissue mobility/dural and neural mobility, decreased hip and LLE strength, and sensitivity to palpation along L>R lumbar paraspinals and gluteal musculature. Pt will continue to benefit from skilled PT services to address deficits and improve function.   OBJECTIVE IMPAIRMENTS: decreased activity tolerance, difficulty walking, decreased ROM, decreased strength, hypomobility, impaired flexibility, and pain.   ACTIVITY LIMITATIONS: standing, squatting, stairs, transfers, bed mobility, and locomotion level  PARTICIPATION LIMITATIONS: meal prep, cleaning, driving, shopping, and community activity  PERSONAL FACTORS: Age, Past/current experiences, and 3+ comorbidities: (depression, HTN, GERD, anxiety)  are also affecting patient's functional outcome.   REHAB POTENTIAL: Good  CLINICAL DECISION  MAKING: Evolving/moderate complexity  EVALUATION COMPLEXITY: Moderate   GOALS: Goals reviewed with patient? Yes  SHORT TERM GOALS: Target date: 08/13/2023  Pt will be independent with HEP in order to improve strength and decrease back pain to improve pain-free function at home and work. Baseline: 07/22/23: Baseline HEP initiated.  Goal status: INITIAL   LONG TERM GOALS: Target date: 09/03/2023  Pt will increase FOTO to at least 50 to demonstrate significant improvement in function at home and work related to back pain  Baseline: 07/22/23: 25 Goal status: INITIAL  2.  Pt will decrease worst back pain by at least 2 points on the NPRS in order to demonstrate clinically significant reduction in back pain. Baseline: 07/22/23: 8-9/10 Goal status: INITIAL  3.  Patient will have full thoracolumbar AROM without reproduction of pain as needed for reaching items on ground, household chores, bending.     Baseline: 07/22/23: Pain and motion loss in all directions Goal status: INITIAL  4.  Pt will demonstrate negotiation of full flight of  steps with reciprocal pattern and no movement deviations and no increase in lower quarter pain as needed for community-level mobility  Baseline: 07/22/23: Significant limitation with stair negotiation Goal status: INITIAL  5.  Pt will perform sit to stand with no UE assist and no reproduction of pain as needed for self-care and home-level mobility  Baseline: 07/22/23: Significant pain behaviors and Gower's sign when getting up from seat Goal status: INITIAL   PLAN: PT FREQUENCY: 1-2x/week  PT DURATION: 6 weeks  PLANNED INTERVENTIONS: Therapeutic exercises, Therapeutic activity, Neuromuscular re-education, Balance training, Gait training, Patient/Family education, Self Care, Joint mobilization, Joint manipulation, Vestibular training, Canalith repositioning, Orthotic/Fit training, DME instructions, Dry Needling, Electrical stimulation, Spinal manipulation, Spinal mobilization, Cryotherapy, Moist heat, Taping, Traction, Ultrasound, Ionotophoresis 4mg /ml Dexamethasone , Manual therapy, and Re-evaluation.  PLAN FOR NEXT SESSION: Manual lumbar traction, repeated flexion/flexion principle MDT, posterior chain mobility/nerve flossing, manual techniques for soft tissue sensitivity prn. Modalities in vicinity of costosternal angle contraindicated due to known benign renal mass/angiomyolipoma.    Venetia Endo, PT, DPT #[id number]  Venetia ONEIDA Endo, PT 08/27/2023, 1:00 PM

## 2023-08-28 ENCOUNTER — Other Ambulatory Visit: Payer: Self-pay

## 2023-08-28 ENCOUNTER — Telehealth: Payer: Self-pay | Admitting: Urology

## 2023-08-28 DIAGNOSIS — D1771 Benign lipomatous neoplasm of kidney: Secondary | ICD-10-CM

## 2023-08-28 NOTE — Telephone Encounter (Signed)
 I contacted [name] regarding her large angiomyolipoma.  Dr. Penne spoke with interventional radiology and they have had excellent success with embolization of a large tumors.  She recommended an appointment with IR for further discussion.  I discussed this with [name] and IR referral placed.

## 2023-08-28 NOTE — Progress Notes (Signed)
Orders sent to IR

## 2023-08-30 ENCOUNTER — Encounter: Payer: Self-pay | Admitting: Physical Therapy

## 2023-09-01 ENCOUNTER — Ambulatory Visit: Payer: Managed Care, Other (non HMO) | Admitting: Physical Therapy

## 2023-09-01 ENCOUNTER — Ambulatory Visit
Admission: RE | Admit: 2023-09-01 | Discharge: 2023-09-01 | Discharge status: Home / Self Care | Payer: Medicare Other | Source: Ambulatory Visit | Attending: Urology | Admitting: Urology

## 2023-09-01 DIAGNOSIS — D1771 Benign lipomatous neoplasm of kidney: Secondary | ICD-10-CM

## 2023-09-01 HISTORY — PX: IR RADIOLOGIST EVAL & MGMT: IMG5224

## 2023-09-01 NOTE — Therapy (Deleted)
 OUTPATIENT PHYSICAL THERAPY TREATMENT   Patient Name: [name] MRN: [id number] DOB:Sep 17, 1950, 73 y.o., female Today's Date: 09/01/2023   END OF SESSION:     Past Medical History:  Diagnosis Date   Actinic keratosis    Allergy    Anxiety    Breast mass 10/07/2016   left 2:00   Depression    GERD (gastroesophageal reflux disease)    Hypertension    Past Surgical History:  Procedure Laterality Date   ABDOMINAL HYSTERECTOMY  1988   complete; due to fibroids   BLADDER REPAIR  2007   BREAST BIOPSY Left 11/25/2022   US  Axilla Bx, Path pending   CHOLECYSTECTOMY  2003   COLONOSCOPY  04/16/2009   KNEE SURGERY Left 2012   TONSILLECTOMY  1960   Patient Active Problem List   Diagnosis Date Noted   Annual physical exam 05/30/2022   Depression, recurrent (HCC) 05/30/2022   Primary hypertension 05/30/2022   Screening mammogram for breast cancer 05/30/2022   Gastroesophageal reflux disease without esophagitis 07/18/2021    PCP: Emilio Kelly DASEN, FNP (Inactive)  REFERRING PROVIDER: Dreama Ozell Mutters*  REFERRING DIAG:  M54.16 (ICD-10-CM) - Radiculopathy, lumbar region    RATIONALE FOR EVALUATION AND TREATMENT: Rehabilitation  THERAPY DIAG: Other low back pain  Radiculopathy, lumbar region  Muscle weakness (generalized)  ONSET DATE: Mid-October 2024;   FOLLOW-UP APPT SCHEDULED WITH REFERRING PROVIDER: Yes    PERTINENT HISTORY: Patient is a 73 year old female with Hx of low back/R hip pain, no radiographic explanation of hip pain; current referral for lumbar radiculopathy. Pt reports Hx of bone spur in R hip that doesn't typically bother her that much; she had injection in it last year. Pt reports pain across her back and down her LLE; numbness down L leg down to L foot. Pt reports feeling unbalanced and unstable on her feet. Pt reports she used to walk up to 10,000 steps/day; she reports she has not done this lately. Symptoms began mid-October when she was  bathing her dog in shower; she was bent over bathing dog and felt notable pain upon standing afterward. Pt is using Gabapentin for neuralgia management; OxyContin  for severe pain episodes about 3 times total now. Pt reports swelling in ankles after prolonged standing, no history of peripheral vascular disease.   PAIN:    Pain Intensity: Present: 5-6/10, Worst: 8-9/10 Pain location: Across low back, pain down LLE, heaviness into L thigh Pain Quality: sharp and dull ; numbing and tingling Radiating: Yes, down LLE Numbness/Tingling: Yes; down LLE to toes, into great toe especially  Focal Weakness: Yes; LLE weakness, intermittent difficulty weightbearing Aggravating factors: standing any length of time, sit to stand after prolonged sitting, turning over in bed, transferring from car, up/down stairs  Relieving factors: Gabapentin, heating pad, TENS, stretching, heat 24-hour pain behavior: Worse in PM previously; no 24-hour pain pattern now  How long can you stand: 30 minutes standing in kitchen tolerated; then, she has to lie down/get off feet  History of prior back injury, pain, surgery, or therapy: Yes; Hx of chiropractic for this episode of pain initially   Imaging: Yes ; Valdemar Sanders, Eric Calamity, MD   EXAM: Magnetic resonance imaging, spinal canal and contents, lumbar, without contrast material. DATE: 07/06/2023 11:24 AM ACCESSION: [id number] UN DICTATED: 07/06/2023 11:37 AM   --Multilevel degenerative changes with varying degrees of spinal canal and neuroforaminal stenosis, most pronounced at L4-L5 and L5-S1.  --Prominent masslike signal abnormality along middle/lower pole of right kidney, indeterminate and may reflect  a lipomatous lesion or artifact from adjacent bowel. Recommend dedicated evaluation with contrast enhanced CT to rule out possibility of mass lesion.     Finazzo, Josephine Joann, [place]   CT Scan - Renal 07/21/23 EXAM: CT RENAL MASS (RENAL MASS  PROTOCOL) ACCESSION: [id number] UN   CLINICAL INDICATION: follow up MRI finding - N28.89 - Renal mass  FINDINGS:  LOWER CHEST: Left lower lobe pulmonary nodules measuring up to 0.3 cm (11:17, 11:14).  LIVER: Normal liver contour. No focal liver lesions.  BILIARY: Gallbladder is surgically absent. Common bile duct dilation measuring up to 0.8 cm (13:39), likely secondary to postcholecystectomy state.  SPLEEN: Subcentimeter splenic hypodensity (11:19), too small to further characterize by CT.  PANCREAS: Normal pancreatic contour. No focal lesions. No ductal dilation.  ADRENAL GLANDS: Normal appearance of the adrenal glands.  KIDNEYS: Right Kidney: Right kidney exophytic interpolar anterior heterogeneous enhancing fat-containing mass without calcifications measuring 9.4 x 7.2 x 7.2 cm (11:54, 13:41). No obstructing calculi. No hydronephrosis. Left Kidney: Homogeneous enhancement. No obstructing calculi. No hydronephrosis. No suspicious enhancing mass.  GI TRACT: No findings of bowel obstruction or acute inflammation.  PERITONEUM, RETROPERITONEUM AND MESENTERY: No free air. No ascites. No fluid collection.  LYMPH NODES: No adenopathy.  VESSELS: Hepatic and portal veins are patent. Normal caliber aorta.  BONES and SOFT TISSUES: No aggressive osseous lesions. Small fat-containing umbilical hernia.  IMPRESSION: -Fat-containing right renal lesion measuring up to 9.4 cm with imaging characteristics most consistent with a benign angiomyolipoma. However, given size, lesion carries risk for spontaneous bleeding and retroperitoneal hemorrhage.  --Left basilar pulmonary micronodules. If patient is at increased risk for lung cancer, consider follow-up chest CT without contrast in 12 months.     Red flags: Negative for bowel/bladder changes, saddle paresthesia, personal history of cancer, h/o spinal tumors, h/o compression fx, h/o abdominal aneurysm, abdominal pain, chills/fever, night  sweats, nausea, vomiting, unrelenting pain, first onset of insidious LBP <20 y/o  -Difficulty with urinary frequency/urgency, urinary incontinence   Living Environment Lives with: lives with their spouse; husband travels for work; pt is at home alone Tues/Wed/Thur Lives in: House/apartment; 2-level home, oceanographer bedroom Stairs: Yes: Internal: 13 steps; on left going up and External: 3 steps; can reach both handrails; 4 steps at backdoor Has following equipment at home: None  Prior level of function: Independent  Occupational demands: Retired  Presenter, Broadcasting: Social Research Officer, Government, cooking, walking routine  Patient Goals: Get back to normal activities    OBJECTIVE:   GAIT: Distance walked: 50 ft Assistive device utilized: None Level of assistance: Complete Independence Comments: Decreased arm swing/trunk rotation, slow velocity and step cadence  Posture: Lumbar lordosis: WNL Mild rounded shoulders  Iliac crest height: Equal bilaterally Lumbar lateral shift: Negative Guarded posture, pt maintains pressure through hands in sitting  AROM AROM (Normal range in degrees) AROM  07/22/23  Lumbar   Flexion (65) 50%* (pain at waist L>R)  Extension (30) 25%* (numbness into toes)  Right lateral flexion (25) 75%*  Left lateral flexion (25) 75% (mild symptoms)  Right rotation (30) 25%*  Left rotation (30) 25%      Hip Right Left  Flexion (125) 120* 120*  Extension (15)    Abduction (40)    Adduction     Internal Rotation (45) 30 20*  External Rotation (45) 45 45*      (* = pain; Blank rows = not tested)  LE MMT: MMT (out of 5) Right 07/22/23 Left 07/22/23  Hip flexion 4* 3+*  Hip extension  Hip abduction 4- 4-  Hip adduction    Hip internal rotation    Hip external rotation    Knee flexion 5 4-*  Knee extension 5 3+*  Ankle dorsiflexion 5 3+*  Ankle plantarflexion    Ankle inversion    Ankle eversion    (* = pain; Blank rows = not tested)  Sensation Grossly intact to  light touch throughout bilateral LEs as determined by testing dermatomes L2-S2. Proprioception, stereognosis, and hot/cold testing deferred on this date.  Reflexes R/L Knee Jerk (L3/4): 2+/2+  Ankle Jerk (S1/2): 2+/1+   Muscle Length Hamstrings: R: Positive L: Positive E[place] (quadriceps): R: Not examined L: Not examined Thomas (hip flexors): R: Not examined L: Not examined   Palpation Location Right Left         Lumbar paraspinals 1 1  Quadratus Lumborum    Gluteus Maximus 1 1  Gluteus Medius  1  Deep hip external rotators  0  PSIS  1  Fortin's Area (SIJ)    Greater Trochanter    (Blank rows = not tested) Graded on 0-4 scale (0 = no pain, 1 = pain, 2 = pain with wincing/grimacing/flinching, 3 = pain with withdrawal, 4 = unwilling to allow palpation)  Passive Accessory Intervertebral Motion Deferred  Special Tests Lumbar Radiculopathy and Discogenic: Centralization and Peripheralization (SN 92, -LR 0.12): Repeated flexion in lying; pressure in back during, no worse after Slump (SN 83, -LR 0.32): R: Positive L: Positive SLR (SN 92, -LR 0.29): R: Positive L:  Positive   Lumbar Foraminal Stenosis: Lumbar quadrant (SN 70): R: Negative L: Positive     TODAY'S TREATMENT: DATE: 09/01/2023   SUBJECTIVE STATEMENT:   Pt reports some urinary incontinence issues that seem to be more significant Tuesday night with pt having accident on bathroom floor. Pt does report history of challenges with incontinence.  Patient reports not experiencing notable pressure in her back at arrival. Patient reports some mental health issues due to anniversary of passing of her granddaughter. Pt reports she is not having as much pain presently. 4/10 NPRS at arrival.     Manual Therapy - for symptom modulation, soft tissue sensitivity and mobility, joint mobility, ROM    Prone lying with 2 pillows under pelvis to prevent lumbar hyperextension: STM and IASTM with Hypervolt along L L3-S1 longissimus  lumborum, L QL, L gluteal musculature; x 20 minutes   *not today* Manual lumbar traction in supine with Mulligan belt; 10 sec on, 10 sec off; x 8 minutes for nerve root decompression and symptom modulation L L4-S1 segmental distraction distraction; 2-3 sec bouts x 20 repetitions   Trigger Point Dry Needling  Prone lying with 2 pillows under pelvis to prevent lumbar hyperextension:  Subsequent Treatment: Instructions provided previously at initial dry needling treatment.    Patient Verbal Consent Given: Yes Education Handout Provided: Yes Muscles Treated: L multifidus at L4 and L5 with 0.30 x 70 single needle placements, L gluteus medius with 0.30 x 70 needle placement.  Electrical Stimulation Performed: No Treatment Response/Outcome: Mild post-treatment soreness, good tolerance of thoracolumbar mobility work post-treatment.      Therapeutic Exercise - for improved soft tissue flexibility and extensibility as needed for ROM, dural/neural mobility as needed to reduce neuralgias/paresthesias,    Double knee to chest, with overpressure and rotation to R side; 1x10, 1 sec   -remaining pressure along low back post-treatment; minimal pain in lying   Lower trunk rotations; 1x10 alternating R/L Open book; 1 x 10, lying on  either side  Supine figure-4 piriformis stretch, focus on LLE today; 2 x 30 sec   PATIENT EDUCATION: HEP reviewed; we discussed potential for use of pelvic health PT to address incontinence and continued follow-up with     *not today* Posterior pelvic tilt to bridge; 2x10 Posterior pelvic tilt isometric wit alternating hip flexion to 90 deg; 2x10 alternating R/L Seated blue physioball rollout; 3-way (anterior and anterolateral R/L); x 5 ea dir  Posterior pelvic tilt; 2x10 MHP (unbilled) utilized post-treatment for analgesic effect and improved soft tissue extensibility, along low back with pt sitting in chair with upright lumbar support; x  5 minutes. Double knee  to chest; 1x10, 1 sec Sciatic nerve flossing, hooklying; 2x10, 1 sec hold     PATIENT EDUCATION:  Education details: see above for patient education details Person educated: Patient Education method: Explanation, Demonstration, and Handouts Education comprehension: verbalized understanding and returned demonstration   HOME EXERCISE PROGRAM:  Access Code: [email or web address] URL: [email or web address] Date: 08/18/2023 Prepared by: [name]  Exercises - Hooklying Lumbar Traction  - 3 x daily - 7 x weekly - 2 sets - 10 reps - Supine Double Knee to Chest  - 5-6 x daily - 7 x weekly - 1 sets - 10 reps - 1sec hold - Supine Lower Trunk Rotation  - 2 x daily - 7 x weekly - 2 sets - 10 reps - Supine 90/90 Sciatic Nerve Glide with Knee Flexion/Extension  - 2 x daily - 7 x weekly - 2 sets - 10 reps - 1-2sec hold - Supine Posterior Pelvic Tilt  - 2 x daily - 7 x weekly - 2 sets - 10 reps - Supine Bridge  - 1 x daily - 7 x weekly - 2 sets - 10 reps   ASSESSMENT:  CLINICAL IMPRESSION: Patient has responded well with addition of dry needling with repeated movement program in regard to lower quarter symptoms and NPRS. She has experienced some episodic incontinence with difficulty with bladder urgency and control Tuesday evening. This can be associated with large benign growth on patient's kidney. We are going to ensure any targeted treatment is strictly along lumbosacral region and continue to monitor for any worsening of incontinence. Pt does exhibit vastly improved thoracolumbar AROM and improved ability to perform transferring and bed mobility. Pt has remaining deficits in thoracolumbar AROM, posterior chain soft tissue mobility/dural and neural mobility, decreased hip and LLE strength, and sensitivity to palpation along L>R lumbar paraspinals and gluteal musculature. Pt will continue to benefit from skilled PT services to address deficits and improve function.   OBJECTIVE IMPAIRMENTS:  decreased activity tolerance, difficulty walking, decreased ROM, decreased strength, hypomobility, impaired flexibility, and pain.   ACTIVITY LIMITATIONS: standing, squatting, stairs, transfers, bed mobility, and locomotion level  PARTICIPATION LIMITATIONS: meal prep, cleaning, driving, shopping, and community activity  PERSONAL FACTORS: Age, Past/current experiences, and 3+ comorbidities: (depression, HTN, GERD, anxiety)  are also affecting patient's functional outcome.   REHAB POTENTIAL: Good  CLINICAL DECISION MAKING: Evolving/moderate complexity  EVALUATION COMPLEXITY: Moderate   GOALS: Goals reviewed with patient? Yes  SHORT TERM GOALS: Target date: 08/13/2023  Pt will be independent with HEP in order to improve strength and decrease back pain to improve pain-free function at home and work. Baseline: 07/22/23: Baseline HEP initiated.  Goal status: INITIAL   LONG TERM GOALS: Target date: 09/03/2023  Pt will increase FOTO to at least 50 to demonstrate significant improvement in function at home and work related to back pain  Baseline: 07/22/23:  25 Goal status: INITIAL  2.  Pt will decrease worst back pain by at least 2 points on the NPRS in order to demonstrate clinically significant reduction in back pain. Baseline: 07/22/23: 8-9/10 Goal status: INITIAL  3.  Patient will have full thoracolumbar AROM without reproduction of pain as needed for reaching items on ground, household chores, bending.     Baseline: 07/22/23: Pain and motion loss in all directions Goal status: INITIAL  4.  Pt will demonstrate negotiation of full flight of steps with reciprocal pattern and no movement deviations and no increase in lower quarter pain as needed for community-level mobility  Baseline: 07/22/23: Significant limitation with stair negotiation Goal status: INITIAL  5.  Pt will perform sit to stand with no UE assist and no reproduction of pain as needed for self-care and home-level mobility   Baseline: 07/22/23: Significant pain behaviors and Gower's sign when getting up from seat Goal status: INITIAL   PLAN: PT FREQUENCY: 1-2x/week  PT DURATION: 6 weeks  PLANNED INTERVENTIONS: Therapeutic exercises, Therapeutic activity, Neuromuscular re-education, Balance training, Gait training, Patient/Family education, Self Care, Joint mobilization, Joint manipulation, Vestibular training, Canalith repositioning, Orthotic/Fit training, DME instructions, Dry Needling, Electrical stimulation, Spinal manipulation, Spinal mobilization, Cryotherapy, Moist heat, Taping, Traction, Ultrasound, Ionotophoresis 4mg /ml Dexamethasone , Manual therapy, and Re-evaluation.  PLAN FOR NEXT SESSION: Manual lumbar traction, repeated flexion/flexion principle MDT, posterior chain mobility/nerve flossing, manual techniques for soft tissue sensitivity prn. Modalities in vicinity of costosternal angle contraindicated due to known benign renal mass/angiomyolipoma.    [name], PT, DPT #[id number]  [name], PT 09/01/2023, 10:45 AM

## 2023-09-01 NOTE — Consult Note (Signed)
 Chief Complaint: Patient was seen in consultation today for renal angiomyolipoma at the request of Stoioff,Scott C  Referring Physician(s): Stoioff,Scott C  History of Present Illness: [name] is a 73 y.o. female who was in her usual state of health until she developed new onset back pain while caring for her 3 dogs just before Thanksgiving.  Subsequent workup included imaging of the thoracic and lumbar spine which incidentally noted a 9 cm renal angiomyolipoma.  She was subsequently referred to her primary care physician, and then to Dr. Twylla of urology.  Operative repair was discussed, but would be a very large surgery with possibility of high blood loss.  Therefore, she was referred to interventional radiology to discuss minimally invasive options.  [name] is in her usual state of health.  She denies flank pain, hematuria, syncope or other evidence of bleeding.  On the review of systems, she does note that she has had some issues with urinary incontinence.  This is unlikely to be related to the presence of the angiomyolipoma.  She has a history of pelvic floor laxity which has been treated in the past.  Additionally, she complains of fatigue and anxiety over her recent diagnosis.  Past Medical History:  Diagnosis Date   Actinic keratosis    Allergy    Anxiety    Breast mass 10/07/2016   left 2:00   Depression    GERD (gastroesophageal reflux disease)    Hypertension     Past Surgical History:  Procedure Laterality Date   ABDOMINAL HYSTERECTOMY  1988   complete; due to fibroids   BLADDER REPAIR  2007   BREAST BIOPSY Left 11/25/2022   US  Axilla Bx, Path pending   CHOLECYSTECTOMY  2003   COLONOSCOPY  04/16/2009   IR RADIOLOGIST EVAL & MGMT  09/01/2023   KNEE SURGERY Left 2012   TONSILLECTOMY  1960    Allergies: Patient has no known allergies.  Medications: Prior to Admission medications   Medication Sig Start Date End Date Taking? Authorizing  Provider  amLODipine -benazepril  (LOTREL) 10-40 MG capsule TAKE 1 CAPSULE DAILY 07/03/23   Emilio Marseille T, FNP  Ascorbic Acid (VITAMIN C) 100 MG tablet Take by mouth daily.    [provider]  Cholecalciferol (VITAMIN D3) 2000 units TABS Take 5,000 Units by mouth daily.    [provider]  escitalopram  (LEXAPRO ) 10 MG tablet TAKE 1 TABLET AT BEDTIME 08/25/23   Clifton, Kellie A, FNP  gabapentin (NEURONTIN) 300 MG capsule Take 300 mg by mouth 3 (three) times daily. 06/24/23   [provider]  Omega-3 Fatty Acids (FISH OIL) 1200 MG CAPS Take 1,290 mg by mouth daily.    [provider]  Potassium 99 MG TABS Take 99 mg by mouth daily.    [provider]  selenium 50 MCG TABS tablet Take 50 mcg by mouth daily.    [provider]  Zinc Gluconate 50 MG CAPS Take 1 tablet by mouth daily.    [provider]     Family History  Problem Relation Age of Onset   Cervical cancer Mother    COPD Mother    COPD Father    Hypertension Father    Colon cancer Neg Hx    Colon polyps Neg Hx    Esophageal cancer Neg Hx    Rectal cancer Neg Hx    Stomach cancer Neg Hx     Social History   Socioeconomic History   Marital status: Married  Spouse name: [name]   Number of children: 2   Years of education: Not on file   Highest education level: Associate degree: occupational, scientist, product/process development, or vocational program  Occupational History   Occupation: retired  Tobacco Use   Smoking status: Never   Smokeless tobacco: Never  Vaping Use   Vaping status: Never Used  Substance and Sexual Activity   Alcohol  use: No   Drug use: No   Sexual activity: Not on file  Other Topics Concern   Not on file  Social History Narrative   Not on file   Social Drivers of Health   Financial Resource Strain: Low Risk  (05/04/2023)   Overall Financial Resource Strain (CARDIA)    Difficulty of Paying Living Expenses: Not hard at all  Food Insecurity: No Food  Insecurity (05/04/2023)   Hunger Vital Sign    Worried About Running Out of Food in the Last Year: Never true    Ran Out of Food in the Last Year: Never true  Transportation Needs: No Transportation Needs (05/04/2023)   PRAPARE - Administrator, Civil Service (Medical): No    Lack of Transportation (Non-Medical): No  Physical Activity: Insufficiently Active (05/04/2023)   Exercise Vital Sign    Days of Exercise per Week: 4 days    Minutes of Exercise per Session: 30 min  Stress: No Stress Concern Present (05/04/2023)   [employer] of Occupational Health - Occupational Stress Questionnaire    Feeling of Stress : Not at all  Social Connections: Unknown (05/04/2023)   Social Connection and Isolation Panel [NHANES]    Frequency of Communication with Friends and Family: Not on file    Frequency of Social Gatherings with Friends and Family: Once a week    Attends Religious Services: Never    Database Administrator or Organizations: No    Attends Banker Meetings: Never    Marital Status: Married    Review of Systems: A 12 point ROS discussed and pertinent positives are indicated in the HPI above.  All other systems are negative.  Review of Systems  Vital Signs: BP (!) 156/64 (BP Location: Right Arm, Patient Position: Sitting, Cuff Size: Normal)   Pulse 61   Temp 97.6 F (36.4 C) (Oral)   Ht 5' 2 (1.575 m)   Wt 70.3 kg   SpO2 98% Comment: room air  BMI 28.35 kg/m   Advance Care Plan: The advanced care plan/surrogate decision maker was discussed at the time of visit and the patient did not wish to discuss or was not able to name a surrogate decision maker or provide an advance care plan.    Physical Exam Constitutional:      General: She is not in acute distress.    Appearance: Normal appearance. She is normal weight.  HENT:     Head: Normocephalic and atraumatic.  Eyes:     General: No scleral icterus. Cardiovascular:     Rate and Rhythm: Normal  rate.  Pulmonary:     Effort: Pulmonary effort is normal.  Abdominal:     General: There is no distension.     Palpations: Abdomen is soft. There is no mass.     Tenderness: There is no abdominal tenderness.  Skin:    General: Skin is warm and dry.  Neurological:     Mental Status: She is alert and oriented to person, place, and time.  Psychiatric:        Mood and  Affect: Mood normal.        Behavior: Behavior normal.       Imaging: IR Radiologist Eval & Mgmt Result Date: 09/01/2023 EXAM: NEW PATIENT OFFICE VISIT CHIEF COMPLAINT: SEE NOTE IN EPIC HISTORY OF PRESENT ILLNESS: SEE NOTE IN EPIC REVIEW OF SYSTEMS: SEE NOTE IN EPIC PHYSICAL EXAMINATION: SEE NOTE IN EPIC ASSESSMENT AND PLAN: SEE NOTE IN EPIC Electronically Signed   By: Wilkie Lent M.D.   On: 09/01/2023 11:50    Labs:  CBC: Recent Labs    04/11/23 1247  WBC 7.7  HGB 13.7  HCT 42.3  PLT 224    COAGS: No results for input(s): INR, APTT in the last 8760 hours.  BMP: Recent Labs    04/11/23 1247  NA 135  K 4.0  CL 100  CO2 25  GLUCOSE 92  BUN 17  CALCIUM 8.5*  CREATININE 0.93  GFRNONAA >60    LIVER FUNCTION TESTS: Recent Labs    04/11/23 1247  BILITOT 0.7  AST 25  ALT 17  ALKPHOS 68  PROT 7.1  ALBUMIN 3.9    TUMOR MARKERS: No results for input(s): AFPTM, CEA, CA199, CHROMGRNA in the last 8760 hours.  Assessment and Plan:  Very pleasant 73 year old female with a reported history of a large incidentally noted 9 cm renal angiomyolipoma.  We do not have access to the imaging records from [facility]  medical system at this time.  I discussed the natural history of renal angiomyolipomas and the increased risk of spontaneous catastrophic hemorrhage and lesions larger than 4 cm.  We discussed treatment options including surgery and minimally invasive transarterial embolization.  I discussed specifically the risks, benefits and alternatives to embolization.  [name] understands and strongly desires to proceed with treatment.  We will need a planning CT scan prior to the procedure in order to provide the most efficient and effective embolization.  1.) CTA abd/pelvis to assess renal AML and for procedural planning.  She prefers [facility] location for imaging.   2.) Schedule for renal angiogram and embolizatrion at [facility]   Thank you for this interesting consult.  I greatly enjoyed meeting [name] and look forward to participating in their care.  A copy of this report was sent to the requesting provider on this date.  Electronically Signed: Wilkie MARLA Lent 09/01/2023, 12:39 PM   I spent a total of  40 Minutes  in face to face in clinical consultation, greater than 50% of which was counseling/coordinating care for renal angiomyolipoma.

## 2023-09-02 ENCOUNTER — Other Ambulatory Visit: Payer: Self-pay | Admitting: Interventional Radiology

## 2023-09-02 DIAGNOSIS — N2889 Other specified disorders of kidney and ureter: Secondary | ICD-10-CM

## 2023-09-02 DIAGNOSIS — D1771 Benign lipomatous neoplasm of kidney: Secondary | ICD-10-CM

## 2023-09-03 ENCOUNTER — Ambulatory Visit: Payer: Medicare Other | Admitting: Physical Therapy

## 2023-09-03 ENCOUNTER — Encounter: Payer: Self-pay | Admitting: Physical Therapy

## 2023-09-03 DIAGNOSIS — M5416 Radiculopathy, lumbar region: Secondary | ICD-10-CM | POA: Diagnosis not present

## 2023-09-03 DIAGNOSIS — M6281 Muscle weakness (generalized): Secondary | ICD-10-CM

## 2023-09-03 DIAGNOSIS — M5459 Other low back pain: Secondary | ICD-10-CM

## 2023-09-03 NOTE — Therapy (Signed)
OUTPATIENT PHYSICAL THERAPY TREATMENT/GOAL UPDATE   Patient Name: [name] MRN: [id number] DOB:06-11-1951, 73 y.o., female Today's Date: 09/03/2023   END OF SESSION:  PT End of Session - 09/03/23 1255     Visit Number 9    Number of Visits 13    Date for PT Re-Evaluation 09/03/23    Authorization Type [employer]/Medicare 2024    PT Start Time 1256    PT Stop Time 1338    PT Time Calculation (min) 42 min    Activity Tolerance Patient limited by pain    Behavior During Therapy [facility] for tasks assessed/performed               Past Medical History:  Diagnosis Date   Actinic keratosis    Allergy    Anxiety    Breast mass 10/07/2016   left 2:00   Depression    GERD (gastroesophageal reflux disease)    Hypertension    Past Surgical History:  Procedure Laterality Date   ABDOMINAL HYSTERECTOMY  1988   complete; due to fibroids   BLADDER REPAIR  2007   BREAST BIOPSY Left 11/25/2022   [place] Axilla Bx, Path pending   CHOLECYSTECTOMY  2003   COLONOSCOPY  04/16/2009   IR RADIOLOGIST EVAL & MGMT  09/01/2023   KNEE SURGERY Left 2012   TONSILLECTOMY  1960   Patient Active Problem List   Diagnosis Date Noted   Annual physical exam 05/30/2022   Depression, recurrent (HCC) 05/30/2022   Primary hypertension 05/30/2022   Screening mammogram for breast cancer 05/30/2022   Gastroesophageal reflux disease without esophagitis 07/18/2021    PCP: Jacky Kindle, FNP (Inactive)  REFERRING PROVIDER: Lazarus Gowda*  REFERRING DIAG:  M54.16 (ICD-10-CM) - Radiculopathy, lumbar region    RATIONALE FOR EVALUATION AND TREATMENT: Rehabilitation  THERAPY DIAG: Other low back pain  Radiculopathy, lumbar region  Muscle weakness (generalized)  ONSET DATE: Mid-October 2024;   FOLLOW-UP APPT SCHEDULED WITH REFERRING PROVIDER: Yes    PERTINENT HISTORY: Patient is a 73 year old female with Hx of low back/R hip pain, no radiographic explanation of hip pain; current  referral for lumbar radiculopathy. Pt reports Hx of bone spur in R hip that doesn't typically bother her that much; she had injection in it last year. Pt reports pain across her back and down her LLE; numbness down L leg down to L foot. Pt reports feeling unbalanced and unstable on her feet. Pt reports she used to walk up to 10,000 steps/day; she reports she has not done this lately. Symptoms began mid-October when she was bathing her dog in shower; she was bent over bathing dog and felt notable pain upon standing afterward. Pt is using Gabapentin for neuralgia management; OxyContin for severe pain episodes about 3 times total now. Pt reports swelling in ankles after prolonged standing, no history of peripheral vascular disease.   PAIN:    Pain Intensity: Present: 5-6/10, Worst: 8-9/10 Pain location: Across low back, pain down LLE, heaviness into L thigh Pain Quality: sharp and dull ; "numbing and tingling" Radiating: Yes, down LLE Numbness/Tingling: Yes; down LLE to toes, into great toe especially  Focal Weakness: Yes; LLE weakness, intermittent difficulty weightbearing Aggravating factors: standing "any length of time," sit to stand after prolonged sitting, turning over in bed, transferring from car, up/down stairs  Relieving factors: Gabapentin, heating pad, TENS, stretching, heat 24-hour pain behavior: Worse in PM previously; no 24-hour pain pattern now  How long can you stand: 30 minutes standing in  kitchen tolerated; then, she has to lie down/get off feet  History of prior back injury, pain, surgery, or therapy: Yes; Hx of chiropractic for this episode of pain initially   Imaging: Yes ; Lin Landsman, Karie Fetch, MD   EXAM: Magnetic resonance imaging, spinal canal and contents, lumbar, without contrast material. DATE: 07/06/2023 11:24 AM ACCESSION: [id number] UN DICTATED: 07/06/2023 11:37 AM   --Multilevel degenerative changes with varying degrees of spinal canal and  neuroforaminal stenosis, most pronounced at L4-L5 and L5-S1.  --Prominent masslike signal abnormality along middle/lower pole of right kidney, indeterminate and may reflect a lipomatous lesion or artifact from adjacent bowel. Recommend dedicated evaluation with contrast enhanced CT to rule out possibility of mass lesion.     Minna Antis [place], [place]   CT Scan - Renal 07/21/23 EXAM: CT RENAL MASS (RENAL MASS PROTOCOL) ACCESSION: [id number] UN   CLINICAL INDICATION: follow up MRI finding - N28.89 - Renal mass  FINDINGS:  LOWER CHEST: Left lower lobe pulmonary nodules measuring up to 0.3 cm (11:17, 11:14).  LIVER: Normal liver contour. No focal liver lesions.  BILIARY: Gallbladder is surgically absent. Common bile duct dilation measuring up to 0.8 cm (13:39), likely secondary to postcholecystectomy state.  SPLEEN: Subcentimeter splenic hypodensity (11:19), too small to further characterize by CT.  PANCREAS: Normal pancreatic contour. No focal lesions. No ductal dilation.  ADRENAL GLANDS: Normal appearance of the adrenal glands.  KIDNEYS: Right Kidney: Right kidney exophytic interpolar anterior heterogeneous enhancing fat-containing mass without calcifications measuring 9.4 x 7.2 x 7.2 cm (11:54, 13:41). No obstructing calculi. No hydronephrosis. Left Kidney: Homogeneous enhancement. No obstructing calculi. No hydronephrosis. No suspicious enhancing mass.  GI TRACT: No findings of bowel obstruction or acute inflammation.  PERITONEUM, RETROPERITONEUM AND MESENTERY: No free air. No ascites. No fluid collection.  LYMPH NODES: No adenopathy.  VESSELS: Hepatic and portal veins are patent. Normal caliber aorta.  BONES and SOFT TISSUES: No aggressive osseous lesions. Small fat-containing umbilical hernia.  IMPRESSION: -Fat-containing right renal lesion measuring up to 9.4 cm with imaging characteristics most consistent with a benign angiomyolipoma. However, given size, lesion  carries risk for spontaneous bleeding and retroperitoneal hemorrhage.  --Left basilar pulmonary micronodules. If patient is at increased risk for lung cancer, consider follow-up chest CT without contrast in 12 months.     Red flags: Negative for bowel/bladder changes, saddle paresthesia, personal history of cancer, h/o spinal tumors, h/o compression fx, h/o abdominal aneurysm, abdominal pain, chills/fever, night sweats, nausea, vomiting, unrelenting pain, first onset of insidious LBP <20 y/o  -Difficulty with urinary frequency/urgency, urinary incontinence   Living Environment Lives with: lives with their spouse; husband travels for work; pt is at home alone Tues/Wed/Thur Lives in: House/apartment; 2-level home, Oceanographer bedroom Stairs: Yes: Internal: 13 steps; on left going up and External: 3 steps; can reach both handrails; 4 steps at backdoor Has following equipment at home: None  Prior level of function: Independent  Occupational demands: Retired  Presenter, broadcasting: Social research officer, government, cooking, walking routine  Patient Goals: Get back to normal activities    OBJECTIVE:   GAIT: Distance walked: 50 ft Assistive device utilized: None Level of assistance: Complete Independence Comments: Decreased arm swing/trunk rotation, slow velocity and step cadence  Posture: Lumbar lordosis: WNL Mild rounded shoulders  Iliac crest height: Equal bilaterally Lumbar lateral shift: Negative Guarded posture, pt maintains pressure through hands in sitting  AROM AROM (Normal range in degrees) AROM  07/22/23 AROM 09/03/23  Lumbar    Flexion (65) 50%* (pain at waist L>R) 75% (pain  low back)*  Extension (30) 25%* (numbness into toes) 25% (L PSIS/iliolumbar pain)*  Right lateral flexion (25) 75%* 100% (L flank)*  Left lateral flexion (25) 75% (mild symptoms) 75% (L flank pain)*  Right rotation (30) 25%* 50% (L flank pain)*  Left rotation (30) 25% 75%       Hip Right Left   Flexion (125) 120* 120*    Extension (15)     Abduction (40)     Adduction      Internal Rotation (45) 30 20*   External Rotation (45) 45 45*        (* = pain; Blank rows = not tested)  LE MMT: MMT (out of 5) Right 07/22/23 Left 07/22/23  Hip flexion 4* 3+*  Hip extension    Hip abduction 4- 4-  Hip adduction    Hip internal rotation    Hip external rotation    Knee flexion 5 4-*  Knee extension 5 3+*  Ankle dorsiflexion 5 3+*  Ankle plantarflexion    Ankle inversion    Ankle eversion    (* = pain; Blank rows = not tested)  Sensation Grossly intact to light touch throughout bilateral LEs as determined by testing dermatomes L2-S2. Proprioception, stereognosis, and hot/cold testing deferred on this date.  Reflexes R/L Knee Jerk (L3/4): 2+/2+  Ankle Jerk (S1/2): 2+/1+   Muscle Length Hamstrings: R: Positive L: Positive [place] (quadriceps): R: Not examined L: Not examined Thomas (hip flexors): R: Not examined L: Not examined   Palpation Location Right Left         Lumbar paraspinals 1 1  Quadratus Lumborum    Gluteus Maximus 1 1  Gluteus Medius  1  Deep hip external rotators  0  PSIS  1  Fortin's Area (SIJ)    Greater Trochanter    (Blank rows = not tested) Graded on 0-4 scale (0 = no pain, 1 = pain, 2 = pain with wincing/grimacing/flinching, 3 = pain with withdrawal, 4 = unwilling to allow palpation)  Passive Accessory Intervertebral Motion Deferred  Special Tests Lumbar Radiculopathy and Discogenic: Centralization and Peripheralization (SN 92, -LR 0.12): Repeated flexion in lying; pressure in back during, no worse after Slump (SN 83, -LR 0.32): R: Positive L: Positive SLR (SN 92, -LR 0.29): R: Positive L:  Positive   Lumbar Foraminal Stenosis: Lumbar quadrant (SN 70): R: Negative L: Positive     TODAY'S TREATMENT: DATE: 09/03/2023   SUBJECTIVE STATEMENT:   Pt is continuing follow-up for her kidney angiomyolipoma - CT scan tomorrow, and then will plan to embolize the growth.  Patient reports pressure along low back. Pt feels she is getting around better and is able to negotiate stairs better. Pt is compliant with HEP. Pt reports few intermittent paresthesias into LLE - not constant. Pt reports 50% global rating of function at this time. Patient reports limitation with walking significantly due to back pain and LLE referred symptoms. Pt arrives for goal update without taking any pain medication.     Manual Therapy - for symptom modulation, soft tissue sensitivity and mobility, joint mobility, ROM    Prone lying with 2 pillows under pelvis to prevent lumbar hyperextension: STM along L L3-S1 longissimus lumborum, L QL, L gluteal musculature; x 15 minutes   *not today* Manual lumbar traction in supine with Mulligan belt; 10 sec on, 10 sec off; x 8 minutes for nerve root decompression and symptom modulation L L4-S1 segmental distraction distraction; 2-3 sec bouts x 20 repetitions  Trigger Point Dry Needling  Prone lying with 2 pillows under pelvis to prevent lumbar hyperextension:  Subsequent Treatment: Instructions provided previously at initial dry needling treatment.    Patient Verbal Consent Given: Yes Education Handout Provided: Yes Muscles Treated: L iliocostalis lumborum at L5-S1 with 0.30 x 60 needle placement, L multifidus at L5 and S1 with 0.30 x 70 single needle placements, L gluteus medius with 0.30 x 70 needle placement.  Electrical Stimulation Performed: No Treatment Response/Outcome: Mild post-treatment soreness, good tolerance of thoracolumbar mobility work post-treatment.      Therapeutic Exercise - for improved soft tissue flexibility and extensibility as needed for ROM, dural/neural mobility as needed to reduce neuralgias/paresthesias,    *GOAL UPDATE PERFORMED   Double knee to chest, with clinician overpressure and rotation to R side; reviewed  Lower trunk rotations; 1x10 alternating R/L  Supine figure-4 piriformis stretch, focus  on LLE today; 2 x 30 sec  PATIENT EDUCATION: discussed progress made, PT plan of care, prognosis. Encouraged pt to continue repeated movement at home and HEP.     *not today* Open book; 1 x 10, lying on either side  Posterior pelvic tilt to bridge; 2x10 Posterior pelvic tilt isometric wit alternating hip flexion to 90 deg; 2x10 alternating R/L Seated blue physioball rollout; 3-way (anterior and anterolateral R/L); x 5 ea dir  Posterior pelvic tilt; 2x10 MHP (unbilled) utilized post-treatment for analgesic effect and improved soft tissue extensibility, along low back with pt sitting in chair with upright lumbar support; x  5 minutes. Double knee to chest; 1x10, 1 sec Sciatic nerve flossing, hooklying; 2x10, 1 sec hold     PATIENT EDUCATION:  Education details: see above for patient education details Person educated: Patient Education method: Explanation, Demonstration, and Handouts Education comprehension: verbalized understanding and returned demonstration   HOME EXERCISE PROGRAM:  Access Code: [email or web address] URL: [email or web address] Date: 08/18/2023 Prepared by: Consuela Mimes  Exercises - Hooklying Lumbar Traction  - 3 x daily - 7 x weekly - 2 sets - 10 reps - Supine Double Knee to Chest  - 5-6 x daily - 7 x weekly - 1 sets - 10 reps - 1sec hold - Supine Lower Trunk Rotation  - 2 x daily - 7 x weekly - 2 sets - 10 reps - Supine 90/90 Sciatic Nerve Glide with Knee Flexion/Extension  - 2 x daily - 7 x weekly - 2 sets - 10 reps - 1-2sec hold - Supine Posterior Pelvic Tilt  - 2 x daily - 7 x weekly - 2 sets - 10 reps - Supine Bridge  - 1 x daily - 7 x weekly - 2 sets - 10 reps   ASSESSMENT:  CLINICAL IMPRESSION: Patient has made moderate progress compared to IE with clinically significant improvement in pain rating scale. She also exhibits modest improvement in thoracolumbar AROM tolerated - she is most limited at this time with tolerance of extension and lateral  flexion toward affected symptoms. She exhibits markedly improved functional mobility, with pt being more readily able to complete bed mobility tasks and transfers with less pain behaviors and upper limb support. She has responded well with addition of dry needling - precaution to be taken with any modalities in vicinity of kidneys due to known angiomyolipoma; this is not believed to be a malignancy. Pt is continuing workup with interventional radiology and urology to plan for embolization of this growth. Pt will take hiatus from PT at this time to focus on taking care of urgent medical need. We  can resume PT once pt has completed this and she is ready to return. Pt has remaining deficits in thoracolumbar AROM, posterior chain soft tissue mobility/dural and neural mobility, decreased hip and LLE strength, and sensitivity to palpation along L>R lumbar paraspinals and gluteal musculature. Pt will continue to benefit from skilled PT services to address deficits and improve function.   OBJECTIVE IMPAIRMENTS: decreased activity tolerance, difficulty walking, decreased ROM, decreased strength, hypomobility, impaired flexibility, and pain.   ACTIVITY LIMITATIONS: standing, squatting, stairs, transfers, bed mobility, and locomotion level  PARTICIPATION LIMITATIONS: meal prep, cleaning, driving, shopping, and community activity  PERSONAL FACTORS: Age, Past/current experiences, and 3+ comorbidities: (depression, HTN, GERD, anxiety)  are also affecting patient's functional outcome.   REHAB POTENTIAL: Good  CLINICAL DECISION MAKING: Evolving/moderate complexity  EVALUATION COMPLEXITY: Moderate   GOALS: Goals reviewed with patient? Yes  SHORT TERM GOALS: Target date: 08/13/2023  Pt will be independent with HEP in order to improve strength and decrease back pain to improve pain-free function at home and work. Baseline: 07/22/23: Baseline HEP initiated.    09/03/23: Pt is compliant with HEP and verbalizes  understanding of exercises. Goal status: ACHIEVED   LONG TERM GOALS: Target date: 09/03/2023  Pt will increase FOTO to at least 50 to demonstrate significant improvement in function at home and work related to back pain  Baseline: 07/22/23: 25   09/03/23: 48/50 Goal status: IN PROGRESS  2.  Pt will decrease worst back pain by at least 2 points on the NPRS in order to demonstrate clinically significant reduction in back pain. Baseline: 07/22/23: 8-9/10   09/03/23: 6/10 at worst Goal status: ACHIEVED   3.  Patient will have full thoracolumbar AROM without reproduction of pain as needed for reaching items on ground, household chores, bending.     Baseline: 07/22/23: Pain and motion loss in all directions   09/03/23: Improved thoracolumbar AROM, remaining extension and L lateral flexion deficit.  Goal status: IN PROGRESS  4.  Pt will demonstrate negotiation of full flight of steps with reciprocal pattern and no movement deviations and no increase in lower quarter pain as needed for community-level mobility  Baseline: 07/22/23: Significant limitation with stair negotiation   09/03/23: Pt negotiates steps with step-to pattern with caution stepping onto symptomatic LLE Goal status: ON-GOING   5.  Pt will perform sit to stand with no UE assist and no reproduction of pain as needed for self-care and home-level mobility  Baseline: 07/22/23: Significant pain behaviors and Gower's sign when getting up from seat   09/03/23: pt able to perform with no UE support, pain along axial low back upon completion of transfer Goal status: PARTIALLY MET   PLAN: PT FREQUENCY: 1-2x/week  PT DURATION: 4 weeks  PLANNED INTERVENTIONS: Therapeutic exercises, Therapeutic activity, Neuromuscular re-education, Balance training, Gait training, Patient/Family education, Self Care, Joint mobilization, Joint manipulation, Vestibular training, Canalith repositioning, Orthotic/Fit training, DME instructions, Dry Needling, Electrical  stimulation, Spinal manipulation, Spinal mobilization, Cryotherapy, Moist heat, Taping, Traction, Ultrasound, Ionotophoresis 4mg /ml Dexamethasone, Manual therapy, and Re-evaluation.  PLAN FOR NEXT SESSION: Flexion-bias exercise, repeated flexion MDT (added rotation to R side with double knee to chest for further opening on L/addressing lateral component per MDT). Continue posterior chain strengthening with avoidance of extension or anterior pelvic tilt. Modalities in vicinity of costosternal angle contraindicated due to known benign renal mass/angiomyolipoma.  Further PT pending f/u with IR and urology for angiomyolipoma; pt will f/u with clinic following embolization of angiomyolipoma.    Consuela Mimes, PT, DPT #  [id number]  Gertie Exon, PT 09/03/2023, 12:55 PM

## 2023-09-04 ENCOUNTER — Ambulatory Visit
Admission: RE | Admit: 2023-09-04 | Discharge: 2023-09-04 | Disposition: A | Discharge status: Home / Self Care | Payer: Managed Care, Other (non HMO) | Source: Ambulatory Visit | Attending: Interventional Radiology | Admitting: Interventional Radiology

## 2023-09-04 DIAGNOSIS — D1771 Benign lipomatous neoplasm of kidney: Secondary | ICD-10-CM

## 2023-09-04 DIAGNOSIS — N2889 Other specified disorders of kidney and ureter: Secondary | ICD-10-CM

## 2023-09-04 DIAGNOSIS — I774 Celiac artery compression syndrome: Secondary | ICD-10-CM | POA: Diagnosis not present

## 2023-09-04 DIAGNOSIS — I700 Atherosclerosis of aorta: Secondary | ICD-10-CM | POA: Diagnosis not present

## 2023-09-04 DIAGNOSIS — K429 Umbilical hernia without obstruction or gangrene: Secondary | ICD-10-CM | POA: Diagnosis not present

## 2023-09-04 MED ORDER — IOPAMIDOL (ISOVUE-370) INJECTION 76%
75.0000 mL | Freq: Once | INTRAVENOUS | Status: AC | PRN
  Administered 2023-09-04: 75 mL via INTRAVENOUS

## 2023-09-11 ENCOUNTER — Other Ambulatory Visit: Payer: Self-pay | Admitting: Interventional Radiology

## 2023-09-11 DIAGNOSIS — N2889 Other specified disorders of kidney and ureter: Secondary | ICD-10-CM

## 2023-09-13 ENCOUNTER — Other Ambulatory Visit (HOSPITAL_COMMUNITY): Payer: Self-pay | Admitting: Radiology

## 2023-09-13 DIAGNOSIS — N2889 Other specified disorders of kidney and ureter: Secondary | ICD-10-CM

## 2023-09-14 ENCOUNTER — Ambulatory Visit
Admission: RE | Admit: 2023-09-14 | Discharge: 2023-09-14 | Disposition: A | Discharge status: Home / Self Care | Payer: Managed Care, Other (non HMO) | Source: Ambulatory Visit | Attending: Interventional Radiology | Admitting: Interventional Radiology

## 2023-09-14 ENCOUNTER — Encounter: Payer: Self-pay | Admitting: Radiology

## 2023-09-14 ENCOUNTER — Other Ambulatory Visit: Payer: Self-pay

## 2023-09-14 DIAGNOSIS — D1771 Benign lipomatous neoplasm of kidney: Secondary | ICD-10-CM | POA: Insufficient documentation

## 2023-09-14 DIAGNOSIS — N2889 Other specified disorders of kidney and ureter: Secondary | ICD-10-CM

## 2023-09-14 HISTORY — PX: IR EMBO TUMOR ORGAN ISCHEMIA INFARCT INC GUIDE ROADMAPPING: IMG5449

## 2023-09-14 LAB — BASIC METABOLIC PANEL
Anion gap: 9 (ref 5–15)
BUN: 19 mg/dL (ref 8–23)
CO2: 26 mmol/L (ref 22–32)
Calcium: 9 mg/dL (ref 8.9–10.3)
Chloride: 107 mmol/L (ref 98–111)
Creatinine, Ser: 0.96 mg/dL (ref 0.44–1.00)
GFR, Estimated: >60 mL/min (ref >60)
Glucose, Bld: 91 mg/dL (ref 70–99)
Potassium: 4 mmol/L (ref 3.5–5.1)
Sodium: 142 mmol/L (ref 135–145)

## 2023-09-14 LAB — CBC
HCT: 41.8 % (ref 36.0–46.0)
Hemoglobin: 13.5 g/dL (ref 12.0–15.0)
MCH: 28 pg (ref 26.0–34.0)
MCHC: 32.3 g/dL (ref 30.0–36.0)
MCV: 86.5 fL (ref 80.0–100.0)
Platelets: 254 10*3/uL (ref 150–400)
RBC: 4.83 MIL/uL (ref 3.87–5.11)
RDW: 13.3 % (ref 11.5–15.5)
WBC: 5.9 10*3/uL (ref 4.0–10.5)
nRBC: 0 % (ref 0.0–0.2)

## 2023-09-14 LAB — PROTIME-INR
INR: 0.9 (ref 0.8–1.2)
Prothrombin Time: 12.5 s (ref 11.4–15.2)

## 2023-09-14 MED ORDER — SODIUM CHLORIDE 0.9 % IV SOLN: INTRAVENOUS | Status: DC

## 2023-09-14 MED ORDER — LIDOCAINE HCL 1 % IJ SOLN
1.0000 mL | Freq: Once | INTRAMUSCULAR | Status: AC
  Administered 2023-09-14: 1 mL via INTRADERMAL

## 2023-09-14 MED ORDER — FENTANYL CITRATE (PF) 100 MCG/2ML IJ SOLN
INTRAMUSCULAR | Status: AC
  Filled 2023-09-14: qty 2

## 2023-09-14 MED ORDER — MIDAZOLAM HCL 2 MG/2ML IJ SOLN
INTRAMUSCULAR | Status: AC
Start: 2023-09-14 — End: ?
  Filled 2023-09-14: qty 2

## 2023-09-14 MED ORDER — DEXAMETHASONE SODIUM PHOSPHATE 10 MG/ML IJ SOLN
10.0000 mg | Freq: Once | INTRAMUSCULAR | Status: AC
  Administered 2023-09-14: 10 mg via INTRAVENOUS
  Filled 2023-09-14: qty 1

## 2023-09-14 MED ORDER — VERAPAMIL HCL 2.5 MG/ML IV SOLN
2.5000 mg | Freq: Once | INTRAVENOUS | Status: AC
  Administered 2023-09-14: 2.5 mg via INTRA_ARTERIAL

## 2023-09-14 MED ORDER — VERAPAMIL HCL 2.5 MG/ML IV SOLN
INTRAVENOUS | Status: AC
  Filled 2023-09-14: qty 2

## 2023-09-14 MED ORDER — ONDANSETRON HCL 4 MG/2ML IJ SOLN
INTRAMUSCULAR | Status: AC | PRN
  Administered 2023-09-14: 4 mg via INTRAVENOUS

## 2023-09-14 MED ORDER — OXYCODONE HCL 5 MG PO TABS
5.0000 mg | ORAL_TABLET | Freq: Once | ORAL | Status: AC
  Administered 2023-09-14: 5 mg via ORAL

## 2023-09-14 MED ORDER — OXYCODONE-ACETAMINOPHEN 5-325 MG PO TABS: 1.0000 | ORAL_TABLET | ORAL | 0 refills | Status: DC | PRN

## 2023-09-14 MED ORDER — ALCOHOL (ABLYSINOL) 99% IA SOLN
5.0000 mL | Freq: Once | INTRA_ARTERIAL | Status: AC
  Administered 2023-09-14: 4 mL
  Filled 2023-09-14: qty 5

## 2023-09-14 MED ORDER — MIDAZOLAM HCL 2 MG/2ML IJ SOLN
INTRAMUSCULAR | Status: AC | PRN
  Administered 2023-09-14 (×3): .5 mg via INTRAVENOUS
  Administered 2023-09-14: 1 mg via INTRAVENOUS
  Administered 2023-09-14: .5 mg via INTRAVENOUS

## 2023-09-14 MED ORDER — NITROGLYCERIN 1 MG/10 ML FOR IR/CATH LAB
2.0000 mL | Freq: Once | INTRA_ARTERIAL | Status: AC
  Administered 2023-09-14: 200 ug via INTRA_ARTERIAL
  Filled 2023-09-14: qty 10

## 2023-09-14 MED ORDER — METHYLPREDNISOLONE 4 MG PO TBPK: ORAL_TABLET | ORAL | 0 refills | Status: DC

## 2023-09-14 MED ORDER — LIPIODOL ULTRAFLUID INJECTION
10.0000 mL | Freq: Once | INTRAMUSCULAR | Status: AC
  Administered 2023-09-14: 4 mL via INTRA_ARTERIAL

## 2023-09-14 MED ORDER — ACETAMINOPHEN 10 MG/ML IV SOLN
1000.0000 mg | Freq: Once | INTRAVENOUS | Status: AC
  Administered 2023-09-14: 1000 mg via INTRAVENOUS
  Filled 2023-09-14: qty 100

## 2023-09-14 MED ORDER — HEPARIN SODIUM (PORCINE) 1000 UNIT/ML IJ SOLN
3000.0000 [IU] | Freq: Once | INTRAMUSCULAR | Status: AC
  Administered 2023-09-14: 3000 [IU] via INTRA_ARTERIAL

## 2023-09-14 MED ORDER — ONDANSETRON HCL 4 MG PO TABS: 4.0000 mg | ORAL_TABLET | Freq: Three times a day (TID) | ORAL | 0 refills | Status: AC | PRN

## 2023-09-14 MED ORDER — OXYCODONE HCL 5 MG PO TABS
ORAL_TABLET | ORAL | Status: AC
  Filled 2023-09-14: qty 1

## 2023-09-14 MED ORDER — LIDOCAINE HCL 1 % IJ SOLN
INTRAMUSCULAR | Status: AC
  Filled 2023-09-14: qty 20

## 2023-09-14 MED ORDER — NITROGLYCERIN 1 MG/10 ML FOR IR/CATH LAB
INTRA_ARTERIAL | Status: AC
  Filled 2023-09-14: qty 10

## 2023-09-14 MED ORDER — ONDANSETRON HCL 4 MG/2ML IJ SOLN
INTRAMUSCULAR | Status: AC
  Filled 2023-09-14: qty 2

## 2023-09-14 MED ORDER — HEPARIN SODIUM (PORCINE) 1000 UNIT/ML IJ SOLN
INTRAMUSCULAR | Status: AC
  Filled 2023-09-14: qty 10

## 2023-09-14 MED ORDER — FENTANYL CITRATE (PF) 100 MCG/2ML IJ SOLN
INTRAMUSCULAR | Status: AC | PRN
  Administered 2023-09-14: 50 ug via INTRAVENOUS
  Administered 2023-09-14 (×2): 25 ug via INTRAVENOUS

## 2023-09-14 MED ORDER — IOHEXOL 300 MG/ML  SOLN
180.0000 mL | Freq: Once | INTRAMUSCULAR | Status: AC | PRN
  Administered 2023-09-14: 180 mL via INTRA_ARTERIAL

## 2023-09-14 NOTE — H&P (Signed)
Chief Complaint: Patient was seen in consultation today for renal angiomyolipoma  Referring Physician(s): Riki Altes  Supervising Physician: Malachy Moan  Patient Status: [facility] - Out-pt  History of Present Illness: [name] is a 73 y.o. female with a medical history significant for a recently diagnosed renal angiomyolipoma. She developed new onset back pain just before Thanksgiving and subsequent work up revealed an incidentally noted 9 cm right renal angiomyolipoma. She was referred to Urology and met with Dr. Lonna Cobb to discuss treatment options including operative repair versus IR embolization. Operative repair was considered high risk and the patient was referred to IR to discuss minimally invasive options.   She met with Dr. Archer Asa 09/01/23 and he discussed the natural history of renal angiomyolipomas and the increased risk of spontaneous catastrophic hemorrhage and lesions larger than 4 cm. He discussed treatment options including surgery and minimally invasive transarterial embolization. He also discussed specifically the risks, benefits and alternatives to embolization. The patient expressed a desire to move forward and she underwent additional imaging with a CTA abdomen/pelvis for procedural planning.   Past Medical History:  Diagnosis Date   Actinic keratosis    Allergy    Anxiety    Breast mass 10/07/2016   left 2:00   Depression    GERD (gastroesophageal reflux disease)    Hypertension     Past Surgical History:  Procedure Laterality Date   ABDOMINAL HYSTERECTOMY  1988   complete; due to fibroids   BLADDER REPAIR  2007   BREAST BIOPSY Left 11/25/2022   [place] Axilla Bx, Path pending   CHOLECYSTECTOMY  2003   COLONOSCOPY  04/16/2009   IR RADIOLOGIST EVAL & MGMT  09/01/2023   KNEE SURGERY Left 2012   TONSILLECTOMY  1960    Allergies: Patient has no known allergies.  Medications: Prior to Admission medications   Medication Sig Start Date  End Date Taking? Authorizing Provider  amLODipine-benazepril (LOTREL) 10-40 MG capsule TAKE 1 CAPSULE DAILY 07/03/23   Merita Norton T, FNP  Ascorbic Acid (VITAMIN C) 100 MG tablet Take by mouth daily.    [provider]  Cholecalciferol (VITAMIN D3) 2000 units TABS Take 5,000 Units by mouth daily.    [provider]  escitalopram (LEXAPRO) 10 MG tablet TAKE 1 TABLET AT BEDTIME 08/25/23   Charlcie Cradle A, FNP  gabapentin (NEURONTIN) 300 MG capsule Take 300 mg by mouth 3 (three) times daily. 06/24/23   [provider]  Omega-3 Fatty Acids (FISH OIL) 1200 MG CAPS Take 1,290 mg by mouth daily.    [provider]  Potassium 99 MG TABS Take 99 mg by mouth daily.    [provider]  selenium 50 MCG TABS tablet Take 50 mcg by mouth daily.    [provider]  Zinc Gluconate 50 MG CAPS Take 1 tablet by mouth daily.    [provider]     Family History  Problem Relation Age of Onset   Cervical cancer Mother    COPD Mother    COPD Father    Hypertension Father    Colon cancer Neg Hx    Colon polyps Neg Hx    Esophageal cancer Neg Hx    Rectal cancer Neg Hx    Stomach cancer Neg Hx     Social History   Socioeconomic History   Marital status: Married    Spouse name: Genene Churn. Rennis Harding   Number of children: 2   Years of education: Not on file  Highest education level: Associate degree: occupational, Scientist, product/process development, or vocational program  Occupational History   Occupation: retired  Tobacco Use   Smoking status: Never   Smokeless tobacco: Never  Vaping Use   Vaping status: Never Used  Substance and Sexual Activity   Alcohol use: No   Drug use: No   Sexual activity: Not on file  Other Topics Concern   Not on file  Social History Narrative   Not on file   Social Drivers of Health   Financial Resource Strain: Low Risk  (05/04/2023)   Overall Financial Resource Strain (CARDIA)    Difficulty of Paying Living Expenses: Not hard at  all  Food Insecurity: No Food Insecurity (05/04/2023)   Hunger Vital Sign    Worried About Running Out of Food in the Last Year: Never true    Ran Out of Food in the Last Year: Never true  Transportation Needs: No Transportation Needs (05/04/2023)   PRAPARE - Administrator, Civil Service (Medical): No    Lack of Transportation (Non-Medical): No  Physical Activity: Insufficiently Active (05/04/2023)   Exercise Vital Sign    Days of Exercise per Week: 4 days    Minutes of Exercise per Session: 30 min  Stress: No Stress Concern Present (05/04/2023)   [employer] of Occupational Health - Occupational Stress Questionnaire    Feeling of Stress : Not at all  Social Connections: Unknown (05/04/2023)   Social Connection and Isolation Panel [NHANES]    Frequency of Communication with Friends and Family: Not on file    Frequency of Social Gatherings with Friends and Family: Once a week    Attends Religious Services: Never    Database administrator or Organizations: No    Attends Banker Meetings: Never    Marital Status: Married    Review of Systems: A 12 point ROS discussed and pertinent positives are indicated in the HPI above.  All other systems are negative.  Review of Systems  Constitutional:  Negative for appetite change and fatigue.  Respiratory:  Negative for cough and shortness of breath.   Cardiovascular:  Negative for chest pain and leg swelling.  Gastrointestinal:  Negative for abdominal pain, diarrhea, nausea and vomiting.  Musculoskeletal:  Positive for back pain.  Neurological:  Negative for dizziness and headaches.    Vital Signs: BP (!) 142/71   Pulse 60   Temp 97.6 F (36.4 C) (Oral)   Resp 15   Ht 5\' 2"  (1.575 m)   Wt 146 lb 9.6 oz (66.5 kg)   SpO2 98%   BMI 26.81 kg/m   Physical Exam Constitutional:      General: She is not in acute distress.    Appearance: She is not ill-appearing.  HENT:     Mouth/Throat:     Mouth: Mucous  membranes are moist.     Pharynx: Oropharynx is clear.  Cardiovascular:     Rate and Rhythm: Bradycardia present.     Pulses: Normal pulses.  Pulmonary:     Effort: Pulmonary effort is normal.  Abdominal:     Palpations: Abdomen is soft.     Tenderness: There is no abdominal tenderness.  Skin:    General: Skin is warm and dry.  Neurological:     Mental Status: She is alert and oriented to person, place, and time.  Psychiatric:        Mood and Affect: Mood normal.        Behavior: Behavior normal.  Thought Content: Thought content normal.        Judgment: Judgment normal.     Imaging: CT ANGIO ABDOMEN PELVIS  W & WO CONTRAST Result Date: 09/05/2023 CLINICAL DATA:  Renal angiomyolipoma, preop planning EXAM: CTA ABDOMEN AND PELVIS WITHOUT AND WITH CONTRAST TECHNIQUE: Multidetector CT imaging of the abdomen and pelvis was performed using the standard protocol during bolus administration of intravenous contrast. Multiplanar reconstructed images and MIPs were obtained and reviewed to evaluate the vascular anatomy. RADIATION DOSE REDUCTION: This exam was performed according to the departmental dose-optimization program which includes automated exposure control, adjustment of the mA and/or kV according to patient size and/or use of iterative reconstruction technique. CONTRAST:  75mL ISOVUE-370 IOPAMIDOL (ISOVUE-370) INJECTION 76% COMPARISON:  10/30/2004 FINDINGS: VASCULAR Aorta: Mild calcified plaque in the infrarenal segment. No aneurysm, dissection, or stenosis. Celiac: High-grade ostial stenosis over length of approximately 1.4 cm, with patent trifurcation anatomy. SMA: Patent. Enlarged collateral supply to the celiac distribution predominantly via GDA. Renals: Single left, patent. Duplicated right. The superior is dominant, supplying the upper pole as well as the exophytic angiomyolipoma. The inferior right renal artery arises anteriorly from the aorta, in the slightly more diminutive,  supplying the lower pole and AML. IMA: Patent without evidence of aneurysm, dissection, vasculitis or significant stenosis. Inflow: Patent without evidence of aneurysm, dissection, vasculitis or significant stenosis. Proximal Outflow: Bilateral common femoral and visualized portions of the superficial and profunda femoral arteries are patent without evidence of aneurysm, dissection, vasculitis or significant stenosis. Veins: Patent hepatic veins, portal vein, SM V, splenic vein, bilateral renal veins, iliac venous system and IVC. Review of the MIP images confirms the above findings. NON-VASCULAR Lower chest: No pleural or pericardial effusion. Visualized lung bases clear. Hepatobiliary: No focal liver abnormality is seen. Status post cholecystectomy. No biliary dilatation. Pancreas: Unremarkable. No pancreatic ductal dilatation or surrounding inflammatory changes. Spleen: Normal in size without focal abnormality. Adrenals/Urinary Tract: No adrenal mass. No urolithiasis. Left kidney unremarkable. There is a 10.2 cm hypervascular exophytic mass from the anteromedial lower pole of the right kidney, containing macroscopic fat, new since previous study 10/30/2004. no hydronephrosis. Urinary bladder nondistended. Stomach/Bowel: Stomach is incompletely distended, unremarkable. Small bowel decompressed. Normal appendix. Colon is partially distended, without acute finding. Lymphatic: No abdominal or pelvic adenopathy. Reproductive: Status post hysterectomy. No adnexal masses. Other: No ascites.  No free air. Musculoskeletal: Small paraumbilical hernia containing only mesenteric fat. Degenerative disc and facet disease L5-S1 IMPRESSION: 1. 10.2 cm exophytic angiomyolipoma from the lower pole of the right kidney, with supply from duplicated right renal arteries. 2. 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 3. Small paraumbilical hernia  containing only mesenteric fat. 4.  Aortic Atherosclerosis (ICD10-I70.0). Electronically Signed   By: Corlis Leak M.D.   On: 09/05/2023 22:07   IR Radiologist Eval & Mgmt Result Date: 09/01/2023 EXAM: NEW PATIENT OFFICE VISIT CHIEF COMPLAINT: SEE NOTE IN EPIC HISTORY OF PRESENT ILLNESS: SEE NOTE IN EPIC REVIEW OF SYSTEMS: SEE NOTE IN EPIC PHYSICAL EXAMINATION: SEE NOTE IN EPIC ASSESSMENT AND PLAN: SEE NOTE IN EPIC Electronically Signed   By: Malachy Moan M.D.   On: 09/01/2023 11:50    Labs:  CBC: Recent Labs    04/11/23 1247 09/14/23 1008  WBC 7.7 5.9  HGB 13.7 13.5  HCT 42.3 41.8  PLT 224 254    COAGS: Recent Labs    09/14/23 1008  INR 0.9    BMP:  Recent Labs    04/11/23 1247 09/14/23 1008  NA 135 142  K 4.0 4.0  CL 100 107  CO2 25 26  GLUCOSE 92 91  BUN 17 19  CALCIUM 8.5* 9.0  CREATININE 0.93 0.96  GFRNONAA >60 >60    LIVER FUNCTION TESTS: Recent Labs    04/11/23 1247  BILITOT 0.7  AST 25  ALT 17  ALKPHOS 68  PROT 7.1  ALBUMIN 3.9    TUMOR MARKERS: No results for input(s): "AFPTM", "CEA", "CA199", "CHROMGRNA" in the last 8760 hours.  Assessment and Plan:  Right renal angiomyolipoma: Wendelyn Breslow, 73 year old female, presents today for an image-guided right renal angiogram with possible embolization of the right renal angiomyolipoma.   Risks and benefits of this procedure were discussed with the patient including, but not limited to bleeding, infection, vascular injury or contrast induced renal failure.  This interventional procedure involves the use of X-rays and because of the nature of the planned procedure, it is possible that we will have prolonged use of X-ray fluoroscopy.  Potential radiation risks to you include (but are not limited to) the following: - A slightly elevated risk for cancer  several years later in life. This risk is typically less than 0.5% percent. This risk is low in comparison to the normal incidence of  human cancer, which is 33% for women and 50% for men according to the [employer] Society. - Radiation induced injury can include skin redness, resembling a rash, tissue breakdown / ulcers and hair loss (which can be temporary or permanent).   The likelihood of either of these occurring depends on the difficulty of the procedure and whether you are sensitive to radiation due to previous procedures, disease, or genetic conditions.   IF your procedure requires a prolonged use of radiation, you will be notified and given written instructions for further action.  It is your responsibility to monitor the irradiated area for the 2 weeks following the procedure and to notify your physician if you are concerned that you have suffered a radiation induced injury.    All of the patient's questions were answered, patient is agreeable to proceed. She has been NPO. She is a full code. She does not take any blood-thinning medications.   Consent signed and in chart.  Thank you for this interesting consult.  I greatly enjoyed meeting GLORIS SHIROMA and look forward to participating in their care.  A copy of this report was sent to the requesting provider on this date.  Electronically Signed: Alwyn Ren, AGACNP-BC 09/14/2023, 11:07 AM   I spent a total of  30 Minutes   in face to face in clinical consultation, greater than 50% of which was counseling/coordinating care for renal angiogram with possible embolization.

## 2023-09-14 NOTE — Progress Notes (Signed)
Patient clinically stable post IR EMbolization of angiomyolipoma of kidney, vitals stable pre and post procedure. Received 3 mg Versed along with Fentanyl 100 mcg IV for procedure. Left TR band intact to radial artery without bleeding nor hematoma. Given zofran 4 mg IV post procedure for complaints of nausea without emesis. Report given to [facility] RN post procedure/specials/22.

## 2023-09-14 NOTE — Procedures (Signed)
Interventional Radiology Procedure Note  Procedure: Right renal angiogram and embolization of angiomyolipoma  Complications: None immediate  Estimated Blood Loss: None  Recommendations:  - Radial band takedown per protocol - DC home after 2 hrs   Signed,  Sterling Big, MD

## 2023-09-24 ENCOUNTER — Other Ambulatory Visit: Payer: Self-pay | Admitting: Interventional Radiology

## 2023-09-24 ENCOUNTER — Ambulatory Visit
Admission: RE | Admit: 2023-09-24 | Discharge: 2023-09-24 | Disposition: A | Discharge status: Home / Self Care | Payer: Managed Care, Other (non HMO) | Source: Ambulatory Visit | Attending: Student | Admitting: Student

## 2023-09-24 DIAGNOSIS — D1771 Benign lipomatous neoplasm of kidney: Secondary | ICD-10-CM | POA: Diagnosis not present

## 2023-09-24 DIAGNOSIS — N2889 Other specified disorders of kidney and ureter: Secondary | ICD-10-CM

## 2023-09-24 HISTORY — PX: IR RADIOLOGIST EVAL & MGMT: IMG5224

## 2023-09-24 NOTE — Progress Notes (Signed)
 Chief Complaint: Patient was consulted remotely today (TeleHealth) for right renal AML at the request of Covington,Jamie R.    Referring Physician(s): Covington,Jamie R  History of Present Illness: [name] is a 73 y.o. female with a history of large right sided renal angiomyolipoma who underwent angiography and combined liquid and coil embolization on 09/14/2023.  We spoke over the phone today for her 2-week follow-up evaluation.  She reports that her recovery has been somewhat challenging.  In particular, she has been having night sweats, decreased appetite and generalized malaise.  She denies fever, chills, nausea or vomiting.  Her symptoms have been improving and she feels like today is going to be a better day.  Of note, the chronic back pain she was experiencing seems to be diminishing.  She is cautiously optimistic that the angiomyolipoma was contributing to the back pain and this may be an additional benefit of having undergone embolization.  No other issues at this time.  Past Medical History:  Diagnosis Date   Actinic keratosis    Allergy    Anxiety    Breast mass 10/07/2016   left 2:00   Depression    GERD (gastroesophageal reflux disease)    Hypertension     Past Surgical History:  Procedure Laterality Date   ABDOMINAL HYSTERECTOMY  1988   complete; due to fibroids   BLADDER REPAIR  2007   BREAST BIOPSY Left 11/25/2022   US  Axilla Bx, Path pending   CHOLECYSTECTOMY  2003   COLONOSCOPY  04/16/2009   IR EMBO TUMOR ORGAN ISCHEMIA INFARCT INC GUIDE ROADMAPPING  09/14/2023   IR RADIOLOGIST EVAL & MGMT  09/01/2023   IR RADIOLOGIST EVAL & MGMT  09/24/2023   KNEE SURGERY Left 2012   TONSILLECTOMY  1960    Allergies: Patient has no known allergies.  Medications: Prior to Admission medications   Medication Sig Start Date End Date Taking? Authorizing Provider  amLODipine -benazepril  (LOTREL) 10-40 MG capsule TAKE 1 CAPSULE DAILY 07/03/23   Emilio Marseille T,  FNP  Ascorbic Acid (VITAMIN C) 100 MG tablet Take by mouth daily.    [provider]  Cholecalciferol (VITAMIN D3) 2000 units TABS Take 5,000 Units by mouth daily.    [provider]  escitalopram  (LEXAPRO ) 10 MG tablet TAKE 1 TABLET AT BEDTIME 08/25/23   Clifton, Kellie A, FNP  famotidine  (PEPCID ) 40 MG tablet Take 40 mg by mouth daily.    [provider]  gabapentin (NEURONTIN) 300 MG capsule Take 300 mg by mouth 3 (three) times daily. 06/24/23   [provider]  Magnesium Citrate 100 MG TABS Take 100 mg by mouth daily.    [provider]  methylPREDNISolone  (MEDROL  DOSEPAK) 4 MG TBPK tablet Dispense as instructed 09/14/23   Covington, Jamie R, NP  Omega-3 Fatty Acids (FISH OIL) 1200 MG CAPS Take 1,290 mg by mouth daily.    [provider]  oxyCODONE -acetaminophen  (PERCOCET) 5-325 MG tablet Take 1 tablet by mouth every 4 (four) hours as needed for up to 20 doses for severe pain (pain score 7-10). 09/14/23   Covington, Jamie R, NP  Potassium 99 MG TABS Take 99 mg by mouth daily.    [provider]  selenium 50 MCG TABS tablet Take 50 mcg by mouth daily.    [provider]  Zinc Gluconate 50 MG CAPS Take 1 tablet by mouth daily.    [provider]     Family History  Problem Relation Age of Onset  Cervical cancer Mother    COPD Mother    COPD Father    Hypertension Father    Colon cancer Neg Hx    Colon polyps Neg Hx    Esophageal cancer Neg Hx    Rectal cancer Neg Hx    Stomach cancer Neg Hx     Social History   Socioeconomic History   Marital status: Married    Spouse name: [name]   Number of children: 2   Years of education: Not on file   Highest education level: Associate degree: occupational, scientist, product/process development, or vocational program  Occupational History   Occupation: retired  Tobacco Use   Smoking status: Never   Smokeless tobacco: Never  Vaping Use   Vaping status: Never Used  Substance and  Sexual Activity   Alcohol  use: No   Drug use: No   Sexual activity: Not on file  Other Topics Concern   Not on file  Social History Narrative   Not on file   Social Drivers of Health   Financial Resource Strain: Low Risk  (05/04/2023)   Overall Financial Resource Strain (CARDIA)    Difficulty of Paying Living Expenses: Not hard at all  Food Insecurity: No Food Insecurity (05/04/2023)   Hunger Vital Sign    Worried About Running Out of Food in the Last Year: Never true    Ran Out of Food in the Last Year: Never true  Transportation Needs: No Transportation Needs (05/04/2023)   PRAPARE - Administrator, Civil Service (Medical): No    Lack of Transportation (Non-Medical): No  Physical Activity: Insufficiently Active (05/04/2023)   Exercise Vital Sign    Days of Exercise per Week: 4 days    Minutes of Exercise per Session: 30 min  Stress: No Stress Concern Present (05/04/2023)   [employer] of Occupational Health - Occupational Stress Questionnaire    Feeling of Stress : Not at all  Social Connections: Unknown (05/04/2023)   Social Connection and Isolation Panel [NHANES]    Frequency of Communication with Friends and Family: Not on file    Frequency of Social Gatherings with Friends and Family: Once a week    Attends Religious Services: Never    Database Administrator or Organizations: No    Attends Engineer, Structural: Never    Marital Status: Married    Review of Systems  Review of Systems: A 12 point ROS discussed and pertinent positives are indicated in the HPI above.  All other systems are negative.  Advance Care Plan: The advanced care plan/surrogate decision maker was discussed at the time of visit and the patient did not wish to discuss or was not able to name a surrogate decision maker or provide an advance care plan.    Physical Exam No direct physical exam was performed (except for noted visual exam findings with Video Visits).    Vital  Signs: There were no vitals taken for this visit.  Imaging: IR Radiologist Eval & Mgmt Result Date: 09/24/2023 EXAM: NEW PATIENT OFFICE VISIT CHIEF COMPLAINT: SEE NOTE IN EPIC HISTORY OF PRESENT ILLNESS: SEE NOTE IN EPIC REVIEW OF SYSTEMS: SEE NOTE IN EPIC PHYSICAL EXAMINATION: SEE NOTE IN EPIC ASSESSMENT AND PLAN: SEE NOTE IN EPIC Electronically Signed   By: Wilkie Lent M.D.   On: 09/24/2023 11:43   IR EMBO TUMOR ORGAN ISCHEMIA INFARCT INC GUIDE ROADMAPPING Result Date: 09/14/2023 INDICATION: 73 year old female with large right renal angiomyolipoma. She presents for elective embolization.  EXAM: IR EMBO TUMOR ORGAN ISCHEMIA INFARCT INC GUIDE ROADMAPPING MEDICATIONS: None. ANESTHESIA/SEDATION: Moderate (conscious) sedation was employed during this procedure. A total of Versed  3 mg and Fentanyl  100 mcg was administered intravenously. Moderate Sedation Time: 78 minutes. The patient's level of consciousness and vital signs were monitored continuously by radiology nursing throughout the procedure under my direct supervision. CONTRAST:  OMNIPAQUE  IOHEXOL  300 MG/ML  SOLN FLUOROSCOPY: Radiation Exposure Index (as provided by the fluoroscopic device): 3,530 mGy Kerma COMPLICATIONS: None immediate. PROCEDURE: Informed consent was obtained from the patient following explanation of the procedure, risks, benefits and alternatives. The patient understands, agrees and consents for the procedure. All questions were addressed. A time out was performed prior to the initiation of the procedure. Maximal barrier sterile technique utilized including caps, mask, sterile gowns, sterile gloves, large sterile drape, hand hygiene, and Betadine prep. The left radial artery was interrogated with ultrasound and found to be widely patent and of adequate diameter. An image was obtained and stored for the medical record. Local anesthesia was attained by infiltration with 1% lidocaine . Under real-time sonographic guidance, the  vessel was punctured with a 21 gauge micropuncture needle. Using standard technique, the initial micro needle was exchanged over a 0.021 micro wire for a hydrophilic 5 French slim arterial sheath. The sheath was flushed and then a radial cocktail consisting of 5000 units heparin , 2.5 mg Verapamil  and 200 mcg nitroglycerine was injected through the sheath. A 100 cm vert catheter was advanced into the abdominal aorta over a Bentson wire. The catheter was first used to select the more superior of the 2 radial arteries. Angiography demonstrates opacification of predominantly renal parenchyma. There is minimal opacification of some aspects of the periphery of the angiomyolipoma. The catheter was next advanced into for inferior right renal artery. Arteriography was performed in multiple obliquities. There is preferential enhancement of the large angiomyolipoma. Multiple abnormal arteries with aneurysmal segments are visualized. There is opacification of a small amount of the lower pole renal parenchyma. A Cook cantata 2.5 French microcatheter was advanced and used to select the branch artery providing predominant supply to the angiomyolipoma. This artery bifurcates almost immediately into medial and lateral branches. Are microcatheter was further advanced over a Fathom 16 wire into the more medial branch. Embolization was then performed using a combination of 50/50 Lipiodol  to ethanol liquid embolization with additional coil embolization. The microcatheter was then advanced into the more lateral branch. Embolization was again performed. Due to changing flow dynamics during the embolization, serial refluxed into the branch arteries to the small portion of the lower pole of the right kidney. Coil embolization was also performed of the more lateral branch. Final follow-up arteriography demonstrates no further opacification of the angiomyolipoma. The catheters were removed. Hemostasis was attained with the assistance of a TR  band. IMPRESSION: Successful combined liquid and coil embolization of large right lower pole angiomyolipoma. Electronically Signed   By: Wilkie Lent M.D.   On: 09/14/2023 14:21   CT ANGIO ABDOMEN PELVIS  W & WO CONTRAST Result Date: 09/05/2023 CLINICAL DATA:  Renal angiomyolipoma, preop planning EXAM: CTA ABDOMEN AND PELVIS WITHOUT AND WITH CONTRAST TECHNIQUE: Multidetector CT imaging of the abdomen and pelvis was performed using the standard protocol during bolus administration of intravenous contrast. Multiplanar reconstructed images and MIPs were obtained and reviewed to evaluate the vascular anatomy. RADIATION DOSE REDUCTION: This exam was performed according to the departmental dose-optimization program which includes automated exposure control, adjustment of the mA and/or kV according to  patient size and/or use of iterative reconstruction technique. CONTRAST:  75mL ISOVUE -370 IOPAMIDOL  (ISOVUE -370) INJECTION 76% COMPARISON:  10/30/2004 FINDINGS: VASCULAR Aorta: Mild calcified plaque in the infrarenal segment. No aneurysm, dissection, or stenosis. Celiac: High-grade ostial stenosis over length of approximately 1.4 cm, with patent trifurcation anatomy. SMA: Patent. Enlarged collateral supply to the celiac distribution predominantly via GDA. Renals: Single left, patent. Duplicated right. The superior is dominant, supplying the upper pole as well as the exophytic angiomyolipoma. The inferior right renal artery arises anteriorly from the aorta, in the slightly more diminutive, supplying the lower pole and AML. IMA: Patent without evidence of aneurysm, dissection, vasculitis or significant stenosis. Inflow: Patent without evidence of aneurysm, dissection, vasculitis or significant stenosis. Proximal Outflow: Bilateral common femoral and visualized portions of the superficial and profunda femoral arteries are patent without evidence of aneurysm, dissection, vasculitis or significant stenosis. Veins: Patent  hepatic veins, portal vein, SM V, splenic vein, bilateral renal veins, iliac venous system and IVC. Review of the MIP images confirms the above findings. NON-VASCULAR Lower chest: No pleural or pericardial effusion. Visualized lung bases clear. Hepatobiliary: No focal liver abnormality is seen. Status post cholecystectomy. No biliary dilatation. Pancreas: Unremarkable. No pancreatic ductal dilatation or surrounding inflammatory changes. Spleen: Normal in size without focal abnormality. Adrenals/Urinary Tract: No adrenal mass. No urolithiasis. Left kidney unremarkable. There is a 10.2 cm hypervascular exophytic mass from the anteromedial lower pole of the right kidney, containing macroscopic fat, new since previous study 10/30/2004. no hydronephrosis. Urinary bladder nondistended. Stomach/Bowel: Stomach is incompletely distended, unremarkable. Small bowel decompressed. Normal appendix. Colon is partially distended, without acute finding. Lymphatic: No abdominal or pelvic adenopathy. Reproductive: Status post hysterectomy. No adnexal masses. Other: No ascites.  No free air. Musculoskeletal: Small paraumbilical hernia containing only mesenteric fat. Degenerative disc and facet disease L5-S1 IMPRESSION: 1. 10.2 cm exophytic angiomyolipoma from the lower pole of the right kidney, with supply from duplicated right renal arteries. 2. 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 3. Small paraumbilical hernia containing only mesenteric fat. 4.  Aortic Atherosclerosis (ICD10-I70.0). Electronically Signed   By: JONETTA Faes M.D.   On: 09/05/2023 22:07   IR Radiologist Eval & Mgmt Result Date: 09/01/2023 EXAM: NEW PATIENT OFFICE VISIT CHIEF COMPLAINT: SEE NOTE IN EPIC HISTORY OF PRESENT ILLNESS: SEE NOTE IN EPIC REVIEW OF SYSTEMS: SEE NOTE IN EPIC PHYSICAL EXAMINATION: SEE NOTE IN EPIC ASSESSMENT AND PLAN: SEE NOTE IN EPIC Electronically  Signed   By: Wilkie Lent M.D.   On: 09/01/2023 11:50    Labs:  CBC: Recent Labs    04/11/23 1247 09/14/23 1008  WBC 7.7 5.9  HGB 13.7 13.5  HCT 42.3 41.8  PLT 224 254    COAGS: Recent Labs    09/14/23 1008  INR 0.9    BMP: Recent Labs    04/11/23 1247 09/14/23 1008  NA 135 142  K 4.0 4.0  CL 100 107  CO2 25 26  GLUCOSE 92 91  BUN 17 19  CALCIUM 8.5* 9.0  CREATININE 0.93 0.96  GFRNONAA >60 >60    LIVER FUNCTION TESTS: Recent Labs    04/11/23 1247  BILITOT 0.7  AST 25  ALT 17  ALKPHOS 68  PROT 7.1  ALBUMIN 3.9    TUMOR MARKERS: No results for input(s): AFPTM, CEA, CA199, CHROMGRNA in the last 8760 hours.  Assessment and Plan:  Extremely pleasant 21 female now 2 weeks status post right  renal angiomyolipoma embolization.  She is doing well overall but continues to have some issues with right flank pain, nausea and decreased appetite.  Additionally, she has been having night sweats but no fever.  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  I would like to evaluate her labs to ensure there is no significant leukocytosis or deterioration of her renal function.  Additionally, since she is not fully recovered I will follow-up again with her in short order to ensure that she has fully recovered from the procedure.  1.)  I will ask my office staff to please order CBC with differential and basic metabolic panel. 2.)  Follow-up clinic visit in 2 weeks.     Electronically Signed: Wilkie MARLA Lent 09/24/2023, 1:02 PM   I spent a total of 15 Minutes in remote  clinical consultation, greater than 50% of which was counseling/coordinating care for right renal AML status post embolization.    Visit type: Audio only (telephone). Audio (no video) only due to patient preference. Alternative for in-person consultation at [facility], [address], [place], [place]. This visit type was conducted due to national  recommendations for restrictions regarding the COVID-19 Pandemic (e.g. social distancing).  This format is felt to be most appropriate for this patient at this time.  All issues noted in this document were discussed and addressed.

## 2023-10-05 ENCOUNTER — Telehealth: Payer: Self-pay

## 2023-10-05 NOTE — Telephone Encounter (Unsigned)
Copied from CRM [id number]. Topic: General - Other >> Oct 05, 2023  9:20 AM Haroldine Laws wrote: Reason for CRM: pt called saying her insurance is changing and she will to [facility] Advantage.  She said she will  be using [facility] through Medicare advantage and will soon needs refills of her prescriptions.  She will not have [place] after Feb.  CB@ [phone]

## 2023-10-07 NOTE — Telephone Encounter (Signed)
Contacted patient to verify if anything she is needing to be done at this moment. Received voicemail. Detailed voicemail left per DPR. Ok for [facility] to verify if any assistance needed at this moment. If not upon insurance change once she has received card she can update information through [facility] as well as be sure to bring updated information into office at next visit.

## 2023-10-08 NOTE — Addendum Note (Signed)
Encounter addended by: Elsworth Soho on: 10/08/2023 9:18 AM  Actions taken: Imaging Exam ended, Charge Capture section accepted

## 2023-11-09 ENCOUNTER — Other Ambulatory Visit: Payer: Self-pay | Admitting: Family Medicine

## 2023-11-09 DIAGNOSIS — F339 Major depressive disorder, recurrent, unspecified: Secondary | ICD-10-CM

## 2023-11-09 DIAGNOSIS — I10 Essential (primary) hypertension: Secondary | ICD-10-CM

## 2023-11-09 NOTE — Telephone Encounter (Signed)
 Copied from CRM [id number]. Topic: Clinical - Medication Refill >> Nov 09, 2023  1:10 PM Gildardo Pounds wrote: Most Recent Primary Care Visit:  Provider: Erasmo Downer  Department: [id number]  Visit Type: OFFICE VISIT  Date: 07/28/2023  Medication: amLODipine-benazepril (LOTREL) 10-40 MG capsule escitalopram (LEXAPRO) 10 MG tablet famotidine (PEPCID) 40 MG tablet  Has the patient contacted their pharmacy? No (Agent: If no, request that the patient contact the pharmacy for the refill. If patient does not wish to contact the pharmacy document the reason why and proceed with request.) (Agent: If yes, when and what did the pharmacy advise?)  Is this the correct pharmacy for this prescription? Yes If no, delete pharmacy and type the correct one.  This is the patient's preferred pharmacy:  [facility] [place] [place] - [address] [place] [address] Phone: [phone] Fax: [phone]  Has the prescription been filled recently? No  Is the patient out of the medication? Yes  Has the patient been seen for an appointment in the last year OR does the patient have an upcoming appointment? Yes  Can we respond through MyChart? Yes  Agent: Please be advised that Rx refills may take up to 3 business days. We ask that you follow-up with your pharmacy.

## 2023-11-11 MED ORDER — ESCITALOPRAM OXALATE 10 MG PO TABS: 10.0000 mg | ORAL_TABLET | Freq: Every day | ORAL | 1 refills | Status: DC

## 2023-11-11 MED ORDER — AMLODIPINE BESY-BENAZEPRIL HCL 10-40 MG PO CAPS
1.0000 | ORAL_CAPSULE | Freq: Every day | ORAL | 1 refills | Status: DC
Start: 2023-11-11 — End: 2024-01-11

## 2023-11-11 MED ORDER — FAMOTIDINE 40 MG PO TABS: 40.0000 mg | ORAL_TABLET | Freq: Every day | ORAL | 3 refills | Status: DC

## 2023-11-11 NOTE — Telephone Encounter (Signed)
 Requested medication (s) are due for refill today: yes  Requested medication (s) are on the active medication list: yes  Last refill:  08/25/23, 09/14/23, 07/03/23  Future visit scheduled: no  Notes to clinic:  Unable to refill per protocol, last refill by another provider no longer at this practice.     Requested Prescriptions  Pending Prescriptions Disp Refills   amLODipine-benazepril (LOTREL) 10-40 MG capsule 90 capsule 0    Sig: Take 1 capsule by mouth daily.     Cardiovascular: CCB + ACEI Combos Passed - 11/11/2023  8:14 AM      Passed - Cr in normal range and within 180 days    Creatinine, Ser  Date Value Ref Range Status  09/14/2023 0.96 0.44 - 1.00 mg/dL Final         Passed - K in normal range and within 180 days    Potassium  Date Value Ref Range Status  09/14/2023 4.0 3.5 - 5.1 mmol/L Final         Passed - Na in normal range and within 180 days    Sodium  Date Value Ref Range Status  09/14/2023 142 135 - 145 mmol/L Final  05/30/2022 140 134 - 144 mmol/L Final         Passed - eGFR is 30 or above and within 180 days    GFR calc Af Amer  Date Value Ref Range Status  06/20/2020 73 >59 mL/min/1.73 Final    Comment:    **In accordance with recommendations from the NKF-ASN Task force,**   [facility] is in the process of updating its eGFR calculation to the   2021 CKD-EPI creatinine equation that estimates kidney function   without a race variable.    GFR, Estimated  Date Value Ref Range Status  09/14/2023 >60 >60 mL/min Final    Comment:    (NOTE) Calculated using the CKD-EPI Creatinine Equation (2021)    eGFR  Date Value Ref Range Status  05/30/2022 64 >59 mL/min/1.73 Final         Passed - Patient is not pregnant      Passed - Last BP in normal range    BP Readings from Last 1 Encounters:  09/14/23 (!) 115/55         Passed - Valid encounter within last 6 months    Recent Outpatient Visits           3 months ago Right renal mass   [facility]  [facility] [place], Marzella Schlein, MD   2 years ago Essential hypertension   [facility] Merita Norton T, FNP   3 years ago Seasonal affective disorder [facility])   [place] [facility] [place], Alessandra Bevels, [place]   4 years ago Colon cancer screening   [facility] [place], Victorino Dike M, [place]   5 years ago Need for influenza vaccination   [facility] [place], Alessandra Bevel[place], [place]       Future Appointments             In 8 months Deirdre Evener, MD [facility]             escitalopram (LEXAPRO) 10 MG tablet 90 tablet 0    Sig: Take 1 tablet (10 mg total) by mouth at bedtime.     Psychiatry:  Antidepressants - SSRI Passed - 11/11/2023  8:14 AM      Passed - Completed PHQ-2  or PHQ-9 in the last 360 days      Passed - Valid encounter within last 6 months    Recent Outpatient Visits           3 months ago Right renal mass   [facility] [place], Marzella Schlein, MD   2 years ago Essential hypertension   [facility] Merita Norton T, FNP   3 years ago Seasonal affective disorder [facility])   [place] [facility] [place], Alessandra Bevel[place], [place]   4 years ago Colon cancer screening   [place] [facility] Joycelyn Man M, [place]   5 years ago Need for influenza vaccination   [facility] [place], Alessandra [place]vel[place], [place]       Future Appointments             In 8 months Deirdre Evener, MD [facility]             famotidine (PEPCID) 40 MG tablet      Sig: Take 1 tablet (40 mg total) by mouth daily.     Gastroenterology:  H2 Antagonists Passed - 11/11/2023  8:14 AM      Passed - Valid encounter within last 12 months    Recent Outpatient Visits           3 months ago Right renal mass   [facility]  [place], Marzella Schlein, [place]   2 years ago Essential hypertension   [place] [facility] Merita Norton T, FNP   3 years ago Seasonal affective disorder [facility])   [facility] [place], Alessandra Bevel[place], [place]   4 years ago Colon cancer screening   [facility] Joycelyn Man M, [place]   5 years ago Need for influenza vaccination   [facility] [place], Alessandra Bevel[place], [place]       Future Appointments             In 8 months Deirdre Evener, MD [facility]

## 2023-12-31 ENCOUNTER — Encounter: Payer: Self-pay | Admitting: Dermatology

## 2023-12-31 ENCOUNTER — Ambulatory Visit: Admitting: Dermatology

## 2023-12-31 DIAGNOSIS — D692 Other nonthrombocytopenic purpura: Secondary | ICD-10-CM

## 2023-12-31 DIAGNOSIS — L821 Other seborrheic keratosis: Secondary | ICD-10-CM

## 2023-12-31 DIAGNOSIS — S80812A Abrasion, left lower leg, initial encounter: Secondary | ICD-10-CM | POA: Diagnosis not present

## 2023-12-31 DIAGNOSIS — L820 Inflamed seborrheic keratosis: Secondary | ICD-10-CM

## 2023-12-31 DIAGNOSIS — W908XXA Exposure to other nonionizing radiation, initial encounter: Secondary | ICD-10-CM

## 2023-12-31 DIAGNOSIS — L570 Actinic keratosis: Secondary | ICD-10-CM | POA: Diagnosis not present

## 2023-12-31 DIAGNOSIS — L578 Other skin changes due to chronic exposure to nonionizing radiation: Secondary | ICD-10-CM

## 2023-12-31 DIAGNOSIS — T148XXA Other injury of unspecified body region, initial encounter: Secondary | ICD-10-CM

## 2023-12-31 NOTE — Patient Instructions (Addendum)
Cryotherapy Aftercare  Wash gently with soap and water everyday.   Apply Vaseline Jelly daily until healed.    Recommend daily broad spectrum sunscreen SPF 30+ to sun-exposed areas, reapply every 2 hours as needed. Call for new or changing lesions.  Staying in the shade or wearing long sleeves, sun glasses (UVA+UVB protection) and wide brim hats (4-inch brim around the entire circumference of the hat) are also recommended for sun protection.    Seborrheic Keratosis  What causes seborrheic keratoses? Seborrheic keratoses are harmless, common skin growths that first appear during adult life.  As time goes by, more growths appear.  Some people may develop a large number of them.  Seborrheic keratoses appear on both covered and uncovered body parts.  They are not caused by sunlight.  The tendency to develop seborrheic keratoses can be inherited.  They vary in color from skin-colored to gray, brown, or even black.  They can be either smooth or have a rough, warty surface.   Seborrheic keratoses are superficial and look as if they were stuck on the skin.  Under the microscope this type of keratosis looks like layers upon layers of skin.  That is why at times the top layer may seem to fall off, but the rest of the growth remains and re-grows.    Treatment Seborrheic keratoses do not need to be treated, but can easily be removed in the office.  Seborrheic keratoses often cause symptoms when they rub on clothing or jewelry.  Lesions can be in the way of shaving.  If they become inflamed, they can cause itching, soreness, or burning.  Removal of a seborrheic keratosis can be accomplished by freezing, burning, or surgery. If any spot bleeds, scabs, or grows rapidly, please return to have it checked, as these can be an indication of a skin cancer.  Due to recent changes in healthcare laws, you may see results of your pathology and/or laboratory studies on MyChart before the doctors have had a chance to  review them. We understand that in some cases there may be results that are confusing or concerning to you. Please understand that not all results are received at the same time and often the doctors may need to interpret multiple results in order to provide you with the best plan of care or course of treatment. Therefore, we ask that you please give [place] 2 business days to thoroughly review all your results before contacting the office for clarification. Should we see a critical lab result, you will be contacted sooner.   If You Need Anything After Your Visit  If you have any questions or concerns for your doctor, please call our main line at [phone] and press option 4 to reach your doctor's medical assistant. If no one answers, please leave a voicemail as directed and we will return your call as soon as possible. Messages left after 4 pm will be answered the following business day.   You may also send [place] a message via MyChart. We typically respond to MyChart messages within 1-2 business days.  For prescription refills, please ask your pharmacy to contact our office. Our fax number is [phone].  If you have an urgent issue when the clinic is closed that cannot wait until the next business day, you can page your doctor at the number below.    Please note that while we do our best to be available for urgent issues outside of office hours, we are not available 24/7.   If  you have an urgent issue and are unable to reach [place], you may choose to seek medical care at your doctor's office, retail clinic, urgent care center, or emergency room.  If you have a medical emergency, please immediately call 911 or go to the emergency department.  Pager Numbers  - Dr. Gwen Pounds: [phone]  - Dr. Roseanne Reno: [phone]  - Dr. Katrinka Blazing: [phone]   In the event of inclement weather, please call our main line at [phone] for an update on the status of any delays or closures.  Dermatology  Medication Tips: Please keep the boxes that topical medications come in in order to help keep track of the instructions about where and how to use these. Pharmacies typically print the medication instructions only on the boxes and not directly on the medication tubes.   If your medication is too expensive, please contact our office at [phone] option 4 or send [place] a message through MyChart.   We are unable to tell what your co-pay for medications will be in advance as this is different depending on your insurance coverage. However, we may be able to find a substitute medication at lower cost or fill out paperwork to get insurance to cover a needed medication.   If a prior authorization is required to get your medication covered by your insurance company, please allow [place] 1-2 business days to complete this process.  Drug prices often vary depending on where the prescription is filled and some pharmacies may offer cheaper prices.  The website www.goodrx.com contains coupons for medications through different pharmacies. The prices here do not account for what the cost may be with help from insurance (it may be cheaper with your insurance), but the website can give you the price if you did not use any insurance.  - You can print the associated coupon and take it with your prescription to the pharmacy.  - You may also stop by our office during regular business hours and pick up a GoodRx coupon card.  - If you need your prescription sent electronically to a different pharmacy, notify our office through [facility] or by phone at [phone] option 4.     Si Usted Necesita Algo Despus de Su Visita  Tambin puede enviarnos un mensaje a travs de Clinical cytogeneticist. Por lo general respondemos a los mensajes de MyChart en el transcurso de 1 a 2 das hbiles.  Para renovar recetas, por favor pida a su farmacia que se ponga en contacto con nuestra oficina. Annie Sable de fax es [place] [phone].  Si  tiene un asunto urgente cuando la clnica est cerrada y que no puede esperar hasta el siguiente da hbil, puede llamar/localizar a su doctor(a) al nmero que aparece a continuacin.   Por favor, tenga en cuenta que aunque hacemos todo lo posible para estar disponibles para asuntos urgentes fuera del horario de [place], no estamos disponibles las 24 horas del da, los 7 [address]  [address] de la [place].   Si tiene un problema urgente y no puede comunicarse con nosotros, puede optar por buscar atencin mdica  en el consultorio de su doctor(a), en una clnica privada, en un centro de atencin urgente o en una sala de emergencias.  Si tiene Engineer, drilling, por favor llame inmediatamente al 911 o vaya a la sala de emergencias.  Nmeros de bper  - Dr. Gwen Pounds: [phone]  - Dra. Roseanne Reno: [phone]  - Dr. Katrinka Blazing: [phone]   En caso de inclemencias del tiempo, por favor llame a nuestra lnea  principal al [phone] para una actualizacin sobre el [place] de cualquier retraso o cierre.  Consejos para la medicacin en dermatologa: Por favor, guarde las cajas en las que vienen los medicamentos de uso tpico para ayudarle a seguir las instrucciones sobre dnde y cmo usarlos. Las farmacias generalmente imprimen las instrucciones del medicamento slo en las cajas y no directamente en los tubos del [place].   Si su medicamento es muy caro, por favor, pngase en contacto con Rolm [place] llamando al [phone] y presione la opcin 4 o envenos un mensaje a travs de Clinical cytogeneticist.   No podemos decirle cul ser su copago por los medicamentos por adelantado ya que esto es diferente dependiendo de la cobertura de su seguro. Sin embargo, es posible que podamos encontrar un medicamento sustituto a Audiological scientist un formulario para que el seguro cubra el medicamento que se considera necesario.   Si se requiere una autorizacin previa para que su compaa de seguros [place] su medicamento, por  favor permtanos de 1 a 2 das hbiles para completar [address]  Los precios de los medicamentos varan con frecuencia dependiendo del Environmental consultant de dnde se surte la receta y alguna farmacias pueden ofrecer precios ms baratos.  El sitio web www.goodrx.com tiene cupones para medicamentos de Health and safety inspector. Los precios aqu no tienen en cuenta lo que podra costar con la ayuda del seguro (puede ser ms barato con su seguro), pero el sitio web puede darle el precio si no utiliz Tourist information centre manager.  - Puede imprimir el cupn correspondiente y llevarlo con su receta a la farmacia.  - Tambin puede pasar por nuestra oficina durante el horario de atencin regular y Education officer, museum una tarjeta de cupones de GoodRx.  - Si necesita que su receta se enve electrnicamente a una farmacia diferente, informe a nuestra oficina a travs de MyChart de [place] o por telfono llamando al [phone] y presione la opcin 4.

## 2023-12-31 NOTE — Progress Notes (Signed)
 Follow-Up Visit   Subjective  [name] is a 73 y.o. female who presents for the following: Spots on back, left upper arm and legs. Thought had a spider bite on left leg. Started out itching. Pink, scaly. Dur: ~1 month. Has been using triple antibiotic ointment.   The patient has spots, moles and lesions to be evaluated, some may be new or changing and the patient may have concern these could be cancer.    The following portions of the chart were reviewed this encounter and updated as appropriate: medications, allergies, medical history  Review of Systems:  No other skin or systemic complaints except as noted in HPI or Assessment and Plan.  Objective  Well appearing patient in no apparent distress; mood and affect are within normal limits.  A focused examination was performed of the following areas: Back, legs, left arm. Relevant physical exam findings are noted in the Assessment and Plan.  Right Lower Leg - Anterior x1, chest x1, L forearm x2, R lat brow x1, R nasal dorsum x1, R upper lip close to philtrum x1, L upper cutaneous lip x1 (8) Erythematous thin papules/macules with gritty scale.  Right Lateral Lower Leg Erythematous keratotic or waxy stuck-on papule or plaque.  Assessment & Plan   SEBORRHEIC KERATOSIS - Stuck-on, waxy, tan-brown papules and/or plaques. Right upper arm. Arms, legs, back.  - Benign-appearing - Discussed benign etiology and prognosis. - Observe - Call for any changes  ACTINIC DAMAGE WITH PRECANCEROUS ACTINIC KERATOSES Counseling for Topical Chemotherapy Management: Patient exhibits: - Severe, confluent actinic changes with pre-cancerous actinic keratoses that is secondary to cumulative UV radiation exposure over time - Condition that is severe; chronic, not at goal. - diffuse scaly erythematous macules and papules with underlying dyspigmentation - Discussed Prescription "Field Treatment" topical Chemotherapy for Severe, Chronic Confluent  Actinic Changes with Pre-Cancerous Actinic Keratoses Field treatment involves treatment of an entire area of skin that has confluent Actinic Changes (Sun/ Ultraviolet light damage) and PreCancerous Actinic Keratoses by method of PhotoDynamic Therapy (PDT) and/or prescription Topical Chemotherapy agents such as 5-fluorouracil , 5-fluorouracil /calcipotriene, and/or imiquimod.  The purpose is to decrease the number of clinically evident and subclinical PreCancerous lesions to prevent progression to development of skin cancer by chemically destroying early precancer changes that may or may not be visible.  It has been shown to reduce the risk of developing skin cancer in the treated area. As a result of treatment, redness, scaling, crusting, and open sores may occur during treatment course. One or more than one of these methods may be used and may have to be used several times to control, suppress and eliminate the PreCancerous changes. Discussed treatment course, expected reaction, and possible side effects. - Recommend daily broad spectrum sunscreen SPF 30+ to sun-exposed areas, reapply every 2 hours as needed.  - Staying in the shade or wearing long sleeves, sun glasses (UVA+UVB protection) and wide brim hats (4-inch brim around the entire circumference of the hat) are also recommended. - Call for new or changing lesions. Discuss with Dr. Bary Likes at next appointment.    Purpura - Chronic; persistent and recurrent.  Treatable, but not curable. - Violaceous macules and patches at arms.  - Benign - Related to trauma, age, sun damage and/or use of blood thinners, chronic use of topical and/or oral steroids - Observe - Can use OTC arnica containing moisturizer such as Dermend Bruise Formula if desired - Call for worsening or other concerns  EXCORIATION/ABRASION Exam: pink scaly patch at left  anterior lower leg  Treatment Plan: Stop triple antibiotic ointment. Start Aquaphor ointment or Vaseline Jelly.  Call if not resolving.    AK (ACTINIC KERATOSIS) (8) Right Lower Leg - Anterior x1, chest x1, L forearm x2, R lat brow x1, R nasal dorsum x1, R upper lip close to philtrum x1, L upper cutaneous lip x1 (8) Actinic keratoses are precancerous spots that appear secondary to cumulative UV radiation exposure/sun exposure over time. They are chronic with expected duration over 1 year. A portion of actinic keratoses will progress to squamous cell carcinoma of the skin. It is not possible to reliably predict which spots will progress to skin cancer and so treatment is recommended to prevent development of skin cancer.  Recommend daily broad spectrum sunscreen SPF 30+ to sun-exposed areas, reapply every 2 hours as needed.  Recommend staying in the shade or wearing long sleeves, sun glasses (UVA+UVB protection) and wide brim hats (4-inch brim around the entire circumference of the hat). Call for new or changing lesions. Destruction of lesion - Right Lower Leg - Anterior x1, chest x1, L forearm x2, R lat brow x1, R nasal dorsum x1, R upper lip close to philtrum x1, L upper cutaneous lip x1 (8) Complexity: simple   Destruction method: cryotherapy   Informed consent: discussed and consent obtained   Timeout:  patient name, date of birth, surgical site, and procedure verified Lesion destroyed using liquid nitrogen: Yes   Region frozen until ice ball extended beyond lesion: Yes   Cryo cycles: 1 or 2. Outcome: patient tolerated procedure well with no complications   Post-procedure details: wound care instructions given   Additional details:  Prior to procedure, discussed risks of blister formation, small wound, skin dyspigmentation, or rare scar following cryotherapy. Recommend Vaseline ointment to treated areas while healing.  INFLAMED SEBORRHEIC KERATOSIS Right Lateral Lower Leg Patient deferred treatment at this time.  SEBORRHEIC KERATOSES   SOLAR PURPURA (HCC)   EXCORIATION   ACTINIC  ELASTOSIS     Return for TBSE As Scheduled, With Dr. Bary Likes.  I, Jill Parcell, CMA, am acting as scribe for Harris Liming, MD.   Documentation: I have reviewed the above documentation for accuracy and completeness, and I agree with the above.  Harris Liming, MD

## 2024-01-10 ENCOUNTER — Other Ambulatory Visit: Payer: Self-pay | Admitting: Family Medicine

## 2024-01-10 DIAGNOSIS — I10 Essential (primary) hypertension: Secondary | ICD-10-CM

## 2024-03-22 ENCOUNTER — Other Ambulatory Visit: Payer: Self-pay | Admitting: Family Medicine

## 2024-03-22 DIAGNOSIS — F339 Major depressive disorder, recurrent, unspecified: Secondary | ICD-10-CM

## 2024-04-17 ENCOUNTER — Other Ambulatory Visit: Payer: Self-pay | Admitting: Family Medicine

## 2024-04-17 DIAGNOSIS — I10 Essential (primary) hypertension: Secondary | ICD-10-CM

## 2024-05-02 ENCOUNTER — Ambulatory Visit

## 2024-05-02 VITALS — BP 134/60 | HR 62 | Ht 62.0 in | Wt 157.1 lb

## 2024-05-02 DIAGNOSIS — Z1231 Encounter for screening mammogram for malignant neoplasm of breast: Secondary | ICD-10-CM

## 2024-05-02 DIAGNOSIS — Z23 Encounter for immunization: Secondary | ICD-10-CM

## 2024-05-02 DIAGNOSIS — I10 Essential (primary) hypertension: Secondary | ICD-10-CM | POA: Diagnosis not present

## 2024-05-02 DIAGNOSIS — R918 Other nonspecific abnormal finding of lung field: Secondary | ICD-10-CM | POA: Diagnosis not present

## 2024-05-02 DIAGNOSIS — E782 Mixed hyperlipidemia: Secondary | ICD-10-CM

## 2024-05-02 DIAGNOSIS — K219 Gastro-esophageal reflux disease without esophagitis: Secondary | ICD-10-CM | POA: Diagnosis not present

## 2024-05-02 DIAGNOSIS — D1771 Benign lipomatous neoplasm of kidney: Secondary | ICD-10-CM

## 2024-05-02 DIAGNOSIS — F419 Anxiety disorder, unspecified: Secondary | ICD-10-CM

## 2024-05-02 DIAGNOSIS — M8580 Other specified disorders of bone density and structure, unspecified site: Secondary | ICD-10-CM

## 2024-05-02 DIAGNOSIS — Z1211 Encounter for screening for malignant neoplasm of colon: Secondary | ICD-10-CM

## 2024-05-02 MED ORDER — FAMOTIDINE 40 MG PO TABS: 40.0000 mg | ORAL_TABLET | Freq: Every day | ORAL | 3 refills | Status: AC

## 2024-05-02 MED ORDER — AMLODIPINE BESY-BENAZEPRIL HCL 10-40 MG PO CAPS: 1.0000 | ORAL_CAPSULE | Freq: Every day | ORAL | 3 refills | Status: AC

## 2024-05-02 MED ORDER — ESCITALOPRAM OXALATE 20 MG PO TABS: 20.0000 mg | ORAL_TABLET | Freq: Every day | ORAL | 3 refills | Status: AC

## 2024-05-02 NOTE — Assessment & Plan Note (Signed)
 Chronic, stable. Continue pepcid  40 mg

## 2024-05-02 NOTE — Assessment & Plan Note (Signed)
 Noted incidentally on CT scan done in December 2024. LLL pulmonary nodules measuring up to 0.3cm.  - Will order follow up CT scan w/o contrast to be done in December

## 2024-05-02 NOTE — Assessment & Plan Note (Addendum)
 S/p embolization in February 2025. Symptoms have resolved. Has not followed up with urology since embolization. Likely needs repeat imaging. Will check BMP today. Will refer to urology for further management.

## 2024-05-02 NOTE — Assessment & Plan Note (Signed)
 Chronic, uncontrolled. The 10-year ASCVD risk score (Arnett DK, et al., 2019) is: 18.8%  Would benefit from a statin, not currently taking. Patient has been working on lifestyle changes since last time lipids were checked. Will recheck lipid panel today. If still elevated, will discuss starting medication.  Lab Results  Component Value Date   CHOL 230 (H) 05/30/2022   HDL 75 05/30/2022   LDLCALC 137 (H) 05/30/2022   TRIG 105 05/30/2022   CHOLHDL 3.1 05/30/2022

## 2024-05-02 NOTE — Progress Notes (Signed)
 New patient visit  Patient: [name]   DOB: 01/30/1951   73 y.o. Female  MRN: [id number] Visit Date: 05/02/2024  Today's healthcare provider: Isaiah DELENA Pepper, MD   Chief Complaint  Patient presents with   Transfer of Care    Patient was previously seen by Kelly Cedar, FNP   Depression   Hypertension   Subjective    [name] is a 73 y.o. female who presents today as a new patient to establish care.   HPI:  HTN: - well controlled at home - takes Lotrel  Anxiety: - Takes lexapro  - has noticed more irritability recently - interested in increasing medication  Hx of R renal angiomyolipoma - s/p embolization in Feb 2025  Pulmonary Nodules - Noted on scan in Dec 2024 - Needs a follow-up CT scan w/o contrast in Dec 2025    Bone Density: Completed 11/03/18. Results reflect osteopenia. Needs repeat every 5 years.   Needs mammo- was due in March but has not done  Past Medical History:  Diagnosis Date   Actinic keratosis    Allergy    Anxiety    Breast mass 10/07/2016   left 2:00   Depression    GERD (gastroesophageal reflux disease)    Hypertension    Past Surgical History:  Procedure Laterality Date   ABDOMINAL HYSTERECTOMY  1988   complete; due to fibroids   BLADDER REPAIR  2007   BREAST BIOPSY Left 11/25/2022   US  Axilla Bx, Path pending   CHOLECYSTECTOMY  2003   COLONOSCOPY  04/16/2009   IR EMBO TUMOR ORGAN ISCHEMIA INFARCT INC GUIDE ROADMAPPING  09/14/2023   IR RADIOLOGIST EVAL & MGMT  09/01/2023   IR RADIOLOGIST EVAL & MGMT  09/24/2023   KNEE SURGERY Left 2012   TONSILLECTOMY  1960   Family Status  Relation Name Status   Mother Melba Deceased at age 81       lung disease   Father Victory Deceased at age 56       lung cancer   Sister  Deceased at age 7       head and neck cancer   Son  Alive   Son  Alive   Neg Hx  (Not Specified)  No partnership data on file   Family History  Problem Relation Age of Onset   Cervical cancer  Mother    COPD Mother    COPD Father    Hypertension Father    Colon cancer Neg Hx    Colon polyps Neg Hx    Esophageal cancer Neg Hx    Rectal cancer Neg Hx    Stomach cancer Neg Hx    Social History   Socioeconomic History   Marital status: Married    Spouse name: Lynwood HERO. Lever   Number of children: 2   Years of education: Not on file   Highest education level: Some college, no degree  Occupational History   Occupation: retired  Tobacco Use   Smoking status: Never   Smokeless tobacco: Never  Vaping Use   Vaping status: Never Used  Substance and Sexual Activity   Alcohol  use: No   Drug use: No   Sexual activity: Not on file  Other Topics Concern   Not on file  Social History Narrative   Not on file   Social Drivers of Health   Financial Resource Strain: Low Risk  (04/30/2024)   Overall Financial Resource Strain (CARDIA)    Difficulty of  Paying Living Expenses: Not very hard  Food Insecurity: No Food Insecurity (04/30/2024)   Hunger Vital Sign    Worried About Running Out of Food in the Last Year: Never true    Ran Out of Food in the Last Year: Never true  Transportation Needs: No Transportation Needs (04/30/2024)   PRAPARE - Administrator, Civil Service (Medical): No    Lack of Transportation (Non-Medical): No  Physical Activity: Sufficiently Active (04/30/2024)   Exercise Vital Sign    Days of Exercise per Week: 6 days    Minutes of Exercise per Session: 30 min  Stress: No Stress Concern Present (04/30/2024)   [employer] of Occupational Health - Occupational Stress Questionnaire    Feeling of Stress: Not at all  Social Connections: Socially Integrated (04/30/2024)   Social Connection and Isolation Panel    Frequency of Communication with Friends and Family: More than three times a week    Frequency of Social Gatherings with Friends and Family: More than three times a week    Attends Religious Services: More than 4 times per year    Active  Member of [employer] or Organizations: Yes    Attends Engineer, structural: More than 4 times per year    Marital Status: Married   Outpatient Medications Prior to Visit  Medication Sig   Ascorbic Acid (VITAMIN C) 100 MG tablet Take by mouth daily.   Cholecalciferol (VITAMIN D3) 2000 units TABS Take 5,000 Units by mouth daily.   Magnesium Citrate 100 MG TABS Take 100 mg by mouth daily.   Omega-3 Fatty Acids (FISH OIL) 1200 MG CAPS Take 1,290 mg by mouth daily.   Potassium 99 MG TABS Take 99 mg by mouth daily.   selenium 50 MCG TABS tablet Take 50 mcg by mouth daily.   Zinc Gluconate 50 MG CAPS Take 1 tablet by mouth daily.   [DISCONTINUED] amLODipine -benazepril  (LOTREL) 10-40 MG capsule TAKE 1 CAPSULE BY MOUTH DAILY   [DISCONTINUED] escitalopram  (LEXAPRO ) 10 MG tablet Take 1 tablet (10 mg total) by mouth at bedtime.   [DISCONTINUED] famotidine  (PEPCID ) 40 MG tablet Take 1 tablet (40 mg total) by mouth daily.   [DISCONTINUED] gabapentin (NEURONTIN) 300 MG capsule Take 300 mg by mouth 3 (three) times daily.   [DISCONTINUED] methylPREDNISolone  (MEDROL  DOSEPAK) 4 MG TBPK tablet Dispense as instructed   [DISCONTINUED] oxyCODONE -acetaminophen  (PERCOCET) 5-325 MG tablet Take 1 tablet by mouth every 4 (four) hours as needed for up to 20 doses for severe pain (pain score 7-10).   No facility-administered medications prior to visit.   No Known Allergies  Reviews of Systems as noted in HPI.      Objective    BP 134/60 (BP Location: Left Arm, Patient Position: Sitting, Cuff Size: Normal)   Pulse 62   Ht 5' 2 (1.575 m)   Wt 157 lb 1.6 oz (71.3 kg)   SpO2 100%   BMI 28.73 kg/m     Physical Exam Constitutional:      Appearance: Normal appearance.  HENT:     Head: Normocephalic and atraumatic.     Mouth/Throat:     Mouth: Mucous membranes are moist.  Eyes:     Pupils: Pupils are equal, round, and reactive to light.  Cardiovascular:     Rate and Rhythm: Normal rate and regular  rhythm.     Heart sounds: Normal heart sounds.  Pulmonary:     Effort: Pulmonary effort is normal.     Breath sounds:  Normal breath sounds.  Skin:    General: Skin is warm.  Neurological:     General: No focal deficit present.     Mental Status: She is alert.     Depression Screen    07/28/2023    8:58 AM 05/04/2023    1:12 PM 05/30/2022   10:08 AM 04/30/2022    1:09 PM  PHQ 2/9 Scores  PHQ - 2 Score 0 0 0 0  PHQ- 9 Score 3  0 0   No results found for any visits on 05/02/24.  Assessment & Plan      Problem List Items Addressed This Visit       Cardiovascular and Mediastinum   Hypertensive disorder - Primary   Chronic, stable. Will refill Lotrel today and recheck BMP.  BP Readings from Last 3 Encounters:  05/02/24 134/60  09/14/23 (!) 115/55  09/01/23 (!) 156/64         Relevant Medications   amLODipine -benazepril  (LOTREL) 10-40 MG capsule   Other Relevant Orders   Basic metabolic panel with GFR     Respiratory   Pulmonary nodules   Noted incidentally on CT scan done in December 2024. LLL pulmonary nodules measuring up to 0.3cm.  - Will order follow up CT scan w/o contrast to be done in December      Relevant Orders   CT CHEST WO CONTRAST     Digestive   Gastroesophageal reflux disease without esophagitis   Chronic, stable. Continue pepcid  40 mg       Relevant Medications   famotidine  (PEPCID ) 40 MG tablet     Musculoskeletal and Integument   Osteopenia   Noted on DEXA scan in 2020. Needs 5 year follow up. Recommend continuing to take calcium and vitamin D supplementation.      Relevant Orders   DG Bone Density     Genitourinary   Renal angiomyolipoma   S/p embolization in February 2025. Symptoms have resolved. Has not followed up with urology since embolization. Likely needs repeat imaging. Will check BMP today. Will refer to urology for further management.      Relevant Orders   Ambulatory referral to Urology     Other   Anxiety    Chronic, uncontrolled at this time. Reports worsening anxiety and irritability due to stress at home. Will increase lexapro  to 20mg . Will follow up in 1 month.      Relevant Medications   escitalopram  (LEXAPRO ) 20 MG tablet   Screening mammogram for breast cancer   Relevant Orders   MM 3D SCREENING MAMMOGRAM BILATERAL BREAST   Mixed hyperlipidemia   Chronic, uncontrolled. The 10-year ASCVD risk score (Arnett DK, et al., 2019) is: 18.8%  Would benefit from a statin, not currently taking. Patient has been working on lifestyle changes since last time lipids were checked. Will recheck lipid panel today. If still elevated, will discuss starting medication.  Lab Results  Component Value Date   CHOL 230 (H) 05/30/2022   HDL 75 05/30/2022   LDLCALC 137 (H) 05/30/2022   TRIG 105 05/30/2022   CHOLHDL 3.1 05/30/2022         Relevant Medications   amLODipine -benazepril  (LOTREL) 10-40 MG capsule   Other Relevant Orders   Lipid panel   Other Visit Diagnoses       Immunization due       Relevant Orders   Flu vaccine HIGH DOSE PF(Fluzone Trivalent) (Completed)     Screening for colon cancer  Relevant Orders   Ambulatory referral to Gastroenterology         Return in about 4 weeks (around 05/30/2024) for AWV with Medicare Team.      Isaiah DELENA Pepper, MD  [facility] [phone] (phone) [phone] (fax)

## 2024-05-02 NOTE — Assessment & Plan Note (Signed)
 Chronic, uncontrolled at this time. Reports worsening anxiety and irritability due to stress at home. Will increase lexapro  to 20mg . Will follow up in 1 month.

## 2024-05-02 NOTE — Patient Instructions (Addendum)
 Please contact [phone] to schedule your bone density exam. You will be asked your location preference to have procedure performed.   Upon results being received our office will contact you. As well as all results can be viewed through your MyChart. Please feel free to contact us  if you have any further questions or concerns.

## 2024-05-02 NOTE — Assessment & Plan Note (Signed)
 Chronic, stable. Will refill Lotrel today and recheck BMP.  BP Readings from Last 3 Encounters:  05/02/24 134/60  09/14/23 (!) 115/55  09/01/23 (!) 156/64

## 2024-05-02 NOTE — Assessment & Plan Note (Signed)
 Noted on DEXA scan in 2020. Needs 5 year follow up. Recommend continuing to take calcium and vitamin D supplementation.

## 2024-05-03 ENCOUNTER — Ambulatory Visit: Payer: Self-pay

## 2024-05-03 ENCOUNTER — Telehealth: Payer: Self-pay | Admitting: *Deleted

## 2024-05-03 ENCOUNTER — Ambulatory Visit: Admitting: Family Medicine

## 2024-05-03 LAB — BASIC METABOLIC PANEL WITH GFR
BUN/Creatinine Ratio: 23 (ref 12–28)
BUN: 26 mg/dL (ref 8–27)
CO2: 22 mmol/L (ref 20–29)
Calcium: 9.8 mg/dL (ref 8.7–10.3)
Chloride: 105 mmol/L (ref 96–106)
Creatinine, Ser: 1.11 mg/dL — ABNORMAL HIGH (ref 0.57–1.00)
Glucose: 85 mg/dL (ref 70–99)
Potassium: 4.8 mmol/L (ref 3.5–5.2)
Sodium: 141 mmol/L (ref 134–144)
eGFR: 52 mL/min/1.73 — ABNORMAL LOW (ref >59)

## 2024-05-03 LAB — LIPID PANEL
Chol/HDL Ratio: 3.7 ratio (ref 0.0–4.4)
Cholesterol, Total: 222 mg/dL — ABNORMAL HIGH (ref 100–199)
HDL: 60 mg/dL (ref >39)
LDL Chol Calc (NIH): 133 mg/dL — ABNORMAL HIGH (ref 0–99)
Triglycerides: 167 mg/dL — ABNORMAL HIGH (ref 0–149)
VLDL Cholesterol Cal: 29 mg/dL (ref 5–40)

## 2024-05-03 NOTE — Telephone Encounter (Signed)
 following up with urology to discuss repeat imaging of your kidneys. will likely need a CT or MRI to ensure that the tumor has shrunk.

## 2024-05-03 NOTE — Progress Notes (Signed)
 Seen by patient [name] on 05/03/2024 10:54 AM

## 2024-05-04 ENCOUNTER — Other Ambulatory Visit: Payer: Self-pay | Admitting: *Deleted

## 2024-05-04 ENCOUNTER — Other Ambulatory Visit: Payer: Self-pay

## 2024-05-04 ENCOUNTER — Ambulatory Visit: Payer: Self-pay

## 2024-05-04 VITALS — Ht 62.0 in | Wt 157.0 lb

## 2024-05-04 DIAGNOSIS — Z0000 Encounter for general adult medical examination without abnormal findings: Secondary | ICD-10-CM

## 2024-05-04 DIAGNOSIS — D1771 Benign lipomatous neoplasm of kidney: Secondary | ICD-10-CM

## 2024-05-04 NOTE — Patient Instructions (Signed)
 [name],  Thank you for taking the time for your Medicare Wellness Visit. I appreciate your continued commitment to your health goals. Please review the care plan we discussed, and feel free to reach out if I can assist you further.  Medicare recommends these wellness visits once per year to help you and your care team stay ahead of potential health issues. These visits are designed to focus on prevention, allowing your provider to concentrate on managing your acute and chronic conditions during your regular appointments.  Please note that Annual Wellness Visits do not include a physical exam. Some assessments may be limited, especially if the visit was conducted virtually. If needed, we may recommend a separate in-person follow-up with your provider.  Ongoing Care Seeing your primary care provider every 3 to 6 months helps us  monitor your health and provide consistent, personalized care.   Referrals If a referral was made during today's visit and you haven't received any updates within two weeks, please contact the referred provider directly to check on the status.  Recommended Screenings:  Health Maintenance  Topic Date Due   Zoster (Shingles) Vaccine (1 of 2) Never done   DEXA scan (bone density measurement)  11/03/2023   Breast Cancer Screening  11/13/2023   Colon Cancer Screening  04/12/2024   COVID-19 Vaccine (6 - 2025-26 season) 04/18/2024   Medicare Annual Wellness Visit  05/03/2024   DTaP/Tdap/Td vaccine (3 - Td or Tdap) 06/07/2029   Pneumococcal Vaccine for age over 26  Completed   Flu Shot  Completed   Hepatitis C Screening  Completed   HPV Vaccine  Aged Out   Meningitis B Vaccine  Aged Out       05/04/2024    1:09 PM  Advanced Directives  Does Patient Have a Medical Advance Directive? No  Would patient like information on creating a medical advance directive? No - Patient declined   Advance Care Planning is important because it: Ensures you receive medical care  that aligns with your values, goals, and preferences. Provides guidance to your family and loved ones, reducing the emotional burden of decision-making during critical moments.  Vision: Annual vision screenings are recommended for early detection of glaucoma, cataracts, and diabetic retinopathy. These exams can also reveal signs of chronic conditions such as diabetes and high blood pressure.  Dental: Annual dental screenings help detect early signs of oral cancer, gum disease, and other conditions linked to overall health, including heart disease and diabetes.  Please see the attached documents for additional preventive care recommendations.

## 2024-05-04 NOTE — Progress Notes (Signed)
 Subjective:   [name] is a 73 y.o. who presents for a Medicare Wellness preventive visit.  As a reminder, Annual Wellness Visits don't include a physical exam, and some assessments may be limited, especially if this visit is performed virtually. We may recommend an in-person follow-up visit with your provider if needed.  Visit Complete: Virtual I connected with  [name] on 05/04/24 by a video and audio enabled telemedicine application and verified that I am speaking with the correct person using two identifiers.  Patient Location: Home  Provider Location: Home Office  I discussed the limitations of evaluation and management by telemedicine. The patient expressed understanding and agreed to proceed.  Vital Signs: Because this visit was a virtual/telehealth visit, some criteria may be missing or patient reported. Any vitals not documented were not able to be obtained and vitals that have been documented are patient reported.  VideoDeclined- This patient declined Librarian, academic. Therefore the visit was completed with audio only.  Persons Participating in Visit: Patient.  AWV Questionnaire: Yes: Patient Medicare AWV questionnaire was completed by the patient on 05/03/24; I have confirmed that all information answered by patient is correct and no changes since this date.  Cardiac Risk Factors include: advanced age (>55men, >71 women);hypertension;dyslipidemia     Objective:    Today's Vitals   05/04/24 1305  Weight: 157 lb (71.2 kg)  Height: 5' 2 (1.575 m)   Body mass index is 28.72 kg/m.     05/04/2024    1:09 PM 05/04/2023    1:17 PM 04/11/2023   12:47 PM 04/30/2022    1:11 PM 12/20/2019   11:06 AM 12/15/2018   10:53 AM 12/10/2017    1:42 PM  Advanced Directives  Does Patient Have a Medical Advance Directive? No No No No No No No   Would patient like information on creating a medical advance directive? No - Patient declined   No -  Patient declined Yes (ED - Information included in AVS) No - Patient declined  No - Patient declined      Data saved with a previous flowsheet row definition    Current Medications (verified) Outpatient Encounter Medications as of 05/04/2024  Medication Sig   amLODipine -benazepril  (LOTREL) 10-40 MG capsule Take 1 capsule by mouth daily.   Ascorbic Acid (VITAMIN C) 100 MG tablet Take by mouth daily.   Cholecalciferol (VITAMIN D3) 2000 units TABS Take 5,000 Units by mouth daily.   escitalopram  (LEXAPRO ) 20 MG tablet Take 1 tablet (20 mg total) by mouth at bedtime.   famotidine  (PEPCID ) 40 MG tablet Take 1 tablet (40 mg total) by mouth daily.   Magnesium Citrate 100 MG TABS Take 100 mg by mouth daily.   Omega-3 Fatty Acids (FISH OIL) 1200 MG CAPS Take 1,290 mg by mouth daily.   Potassium 99 MG TABS Take 99 mg by mouth daily.   selenium 50 MCG TABS tablet Take 50 mcg by mouth daily.   Zinc Gluconate 50 MG CAPS Take 1 tablet by mouth daily.   No facility-administered encounter medications on file as of 05/04/2024.    Allergies (verified) Patient has no known allergies.   History: Past Medical History:  Diagnosis Date   Actinic keratosis    Allergy    Anxiety    Breast mass 10/07/2016   left 2:00   Depression    GERD (gastroesophageal reflux disease)    Hypertension    Past Surgical History:  Procedure Laterality Date  ABDOMINAL HYSTERECTOMY  1988   complete; due to fibroids   BLADDER REPAIR  2007   BREAST BIOPSY Left 11/25/2022   US  Axilla Bx, Path pending   CHOLECYSTECTOMY  2003   COLONOSCOPY  04/16/2009   IR EMBO TUMOR ORGAN ISCHEMIA INFARCT INC GUIDE ROADMAPPING  09/14/2023   IR RADIOLOGIST EVAL & MGMT  09/01/2023   IR RADIOLOGIST EVAL & MGMT  09/24/2023   KNEE SURGERY Left 2012   TONSILLECTOMY  1960   Family History  Problem Relation Age of Onset   Cervical cancer Mother    COPD Mother    COPD Father    Hypertension Father    Colon cancer Neg Hx    Colon polyps  Neg Hx    Esophageal cancer Neg Hx    Rectal cancer Neg Hx    Stomach cancer Neg Hx    Social History   Socioeconomic History   Marital status: Married    Spouse name: [name]   Number of children: 2   Years of education: Not on file   Highest education level: Some college, no degree  Occupational History   Occupation: retired  Tobacco Use   Smoking status: Never   Smokeless tobacco: Never  Vaping Use   Vaping status: Never Used  Substance and Sexual Activity   Alcohol  use: No   Drug use: No   Sexual activity: Not on file  Other Topics Concern   Not on file  Social History Narrative   Not on file   Social Drivers of Health   Financial Resource Strain: Low Risk  (04/30/2024)   Overall Financial Resource Strain (CARDIA)    Difficulty of Paying Living Expenses: Not very hard  Food Insecurity: No Food Insecurity (04/30/2024)   Hunger Vital Sign    Worried About Running Out of Food in the Last Year: Never true    Ran Out of Food in the Last Year: Never true  Transportation Needs: No Transportation Needs (04/30/2024)   PRAPARE - Administrator, Civil Service (Medical): No    Lack of Transportation (Non-Medical): No  Physical Activity: Sufficiently Active (04/30/2024)   Exercise Vital Sign    Days of Exercise per Week: 6 days    Minutes of Exercise per Session: 30 min  Stress: No Stress Concern Present (04/30/2024)   [employer] of Occupational Health - Occupational Stress Questionnaire    Feeling of Stress: Not at all  Social Connections: Socially Integrated (04/30/2024)   Social Connection and Isolation Panel    Frequency of Communication with Friends and Family: More than three times a week    Frequency of Social Gatherings with Friends and Family: More than three times a week    Attends Religious Services: More than 4 times per year    Active Member of [employer] or Organizations: Yes    Attends Engineer, structural: More than 4 times per year     Marital Status: Married    Tobacco Counseling Counseling given: Not Answered    Clinical Intake:  Pre-visit preparation completed: Yes  Pain : No/denies pain     BMI - recorded: 28.72 Nutritional Status: BMI 25 -29 Overweight Nutritional Risks: None Diabetes: No  No results found for: HGBA1C   How often do you need to have someone help you when you read instructions, pamphlets, or other written materials from your doctor or pharmacy?: 1 - Never  Interpreter Needed?: No  Information entered by :: Ellouise Haws, LPN  Activities of Daily Living     05/03/2024    9:10 PM 09/14/2023   10:18 AM  In your present state of health, do you have any difficulty performing the following activities:  Hearing? 0 0  Vision? 0 0  Difficulty concentrating or making decisions? 0 0  Walking or climbing stairs? 0   Dressing or bathing? 0   Doing errands, shopping? 0   Preparing Food and eating ? N   Using the Toilet? N   In the past six months, have you accidently leaked urine? N   Do you have problems with loss of bowel control? N   Managing your Medications? N   Managing your Finances? N   Housekeeping or managing your Housekeeping? N     Patient Care Team: Franchot Isaiah LABOR, MD as PCP - General (Family Medicine) Hester Alm BROCKS, MD as Consulting Physician (Dermatology) Lucio Franky PARAS, OD as Consulting Physician (Optometry) Curlene Agent, MD as Consulting Physician (Obstetrics and Gynecology) Nandigam, Kavitha V, MD as Consulting Physician (Gastroenterology)  I have updated your Care Teams any recent Medical Services you may have received from other providers in the past year.     Assessment:   This is a routine wellness examination for [name].  Hearing/Vision screen Hearing Screening - Comments:: Pt denies any hearing issues  Vision Screening - Comments:: Wears rx glasses - up to date with routine [facility] exams [facility]    Goals  Addressed             This Visit's Progress    Patient Stated       Lose 10 lbs by feb 26        Depression Screen     05/04/2024    1:08 PM 07/28/2023    8:58 AM 05/04/2023    1:12 PM 05/30/2022   10:08 AM 04/30/2022    1:09 PM 07/18/2021   10:34 AM 06/20/2020    9:35 AM  PHQ 2/9 Scores  PHQ - 2 Score 0 0 0 0 0 0 0  PHQ- 9 Score  3  0 0  0    Fall Risk     05/03/2024    9:10 PM 05/04/2023    1:04 PM 05/30/2022   10:07 AM 05/29/2022   11:02 AM 04/30/2022    1:12 PM  Fall Risk   Falls in the past year? 0 0 0 0 0  Number falls in past yr: 0 0 0 0 0  Injury with Fall? 0 0 0 0 0  Risk for fall due to : No Fall Risks No Fall Risks No Fall Risks  No Fall Risks  Follow up Falls prevention discussed Falls prevention discussed;Education provided Falls evaluation completed   Falls prevention discussed      Data saved with a previous flowsheet row definition    MEDICARE RISK AT HOME:  Medicare Risk at Home Any stairs in or around the home?: (Patient-Rptd) Yes If so, are there any without handrails?: (Patient-Rptd) No Home free of loose throw rugs in walkways, pet beds, electrical cords, etc?: (Patient-Rptd) Yes Adequate lighting in your home to reduce risk of falls?: (Patient-Rptd) Yes Life alert?: (Patient-Rptd) No Use of a cane, walker or w/c?: (Patient-Rptd) No Grab bars in the bathroom?: (Patient-Rptd) No Shower chair or bench in shower?: (Patient-Rptd) Yes Elevated toilet seat or a handicapped toilet?: (Patient-Rptd) Yes  TIMED UP AND GO:  Was the test performed?  No  Cognitive Function: 6CIT  completed        05/04/2024    1:10 PM 05/04/2023    1:19 PM  6CIT Screen  What Year? 0 points 0 points  What month? 0 points 0 points  What time? 0 points 0 points  Count back from 20 0 points 0 points  Months in reverse 0 points 0 points  Repeat phrase 0 points 0 points  Total Score 0 points 0 points    Immunizations Immunization History  Administered Date(s)  Administered   Fluad Quad(high Dose 65+) 06/08/2019, 06/20/2020, 04/30/2022   INFLUENZA, HIGH DOSE SEASONAL PF 07/06/2017, 05/12/2018, 07/09/2023, 05/02/2024   PFIZER(Purple Top)SARS-COV-2 Vaccination 10/13/2019, 11/10/2019, 07/19/2020   Pfizer Covid-19 Vaccine Bivalent Booster 66yrs & up 10/13/2019, 11/10/2019   Pneumococcal Conjugate-13 05/12/2018   Pneumococcal Polysaccharide-23 06/08/2019   Td 06/08/2019   Tdap 03/14/2009    Screening Tests Health Maintenance  Topic Date Due   Zoster Vaccines- Shingrix (1 of 2) Never done   DEXA SCAN  11/03/2023   Mammogram  11/13/2023   Colonoscopy  04/12/2024   COVID-19 Vaccine (6 - 2025-26 season) 04/18/2024   Medicare Annual Wellness (AWV)  05/04/2025   DTaP/Tdap/Td (3 - Td or Tdap) 06/07/2029   Pneumococcal Vaccine: 50+ Years  Completed   Influenza Vaccine  Completed   Hepatitis C Screening  Completed   HPV VACCINES  Aged Out   Meningococcal B Vaccine  Aged Out    Health Maintenance Items Addressed: See Nurse Notes at the end of this note  Additional Screening:  Vision Screening: Recommended annual ophthalmology exams for early detection of glaucoma and other disorders of the eye. Is the patient up to date with their annual eye exam?  No  Who is the provider or what is the name of the office in which the patient attends annual eye exams? Dr [facility]  Dental Screening: Recommended annual dental exams for proper oral hygiene  Community Resource Referral / Chronic Care Management: CRR required this visit?  No   CCM required this visit?  No   Plan:    I have personally reviewed and noted the following in the patient's chart:   Medical and social history Use of alcohol , tobacco or illicit drugs  Current medications and supplements including opioid prescriptions. Patient is not currently taking opioid prescriptions. Functional ability and status Nutritional status Physical activity Advanced directives List of  other physicians Hospitalizations, surgeries, and ER visits in previous 12 months Vitals Screenings to include cognitive, depression, and falls Referrals and appointments  In addition, I have reviewed and discussed with patient certain preventive protocols, quality metrics, and best practice recommendations. A written personalized care plan for preventive services as well as general preventive health recommendations were provided to patient.   Ellouise VEAR Haws, LPN   [id number]   After Visit Summary: (MyChart) Due to this being a telephonic visit, the after visit summary with patients personalized plan was offered to patient via MyChart   Notes: Nothing significant to report at this time.

## 2024-05-04 NOTE — Telephone Encounter (Signed)
 Please order CT abdomen with contrast and PA follow-up

## 2024-05-04 NOTE — Progress Notes (Signed)
 Medical screening examination/treatment/procedure(s) were performed by non-physician practitioner and as supervising physician I was immediately available for consultation/collaboration.  Isaiah Pepper, MD [facility]

## 2024-05-11 ENCOUNTER — Ambulatory Visit
Admission: RE | Admit: 2024-05-11 | Discharge: 2024-05-11 | Disposition: A | Discharge status: Home / Self Care | Source: Ambulatory Visit | Attending: Urology | Admitting: Urology

## 2024-05-11 DIAGNOSIS — D1771 Benign lipomatous neoplasm of kidney: Secondary | ICD-10-CM | POA: Insufficient documentation

## 2024-05-11 DIAGNOSIS — Z9049 Acquired absence of other specified parts of digestive tract: Secondary | ICD-10-CM | POA: Diagnosis not present

## 2024-05-11 MED ORDER — IOHEXOL 300 MG/ML  SOLN
100.0000 mL | Freq: Once | INTRAMUSCULAR | Status: AC | PRN
  Administered 2024-05-11: 100 mL via INTRAVENOUS

## 2024-05-25 ENCOUNTER — Ambulatory Visit: Admitting: Physician Assistant

## 2024-05-25 ENCOUNTER — Ambulatory Visit: Payer: Self-pay

## 2024-05-27 NOTE — Progress Notes (Signed)
   06/02/2024 9:18 AM   [name] 1950-10-17 [id number]  Reason for visit: Follow up Right AML    HPI: 73 y.o. female, initial follow up with me today, previously seen by Dr. Twylla in Jan 2025  CT Abd (05/17/24) - 8.2 cm Right AML (s/p embo, previously 9.5 cm)   Accompanied by husband today Very pleasant people She is having on going nonspecific lower back pain towards right side, abdominal discomfort, GI issues   - A lot of the symptoms had temporarily decreased following embolization although now returning She would like to have definitive surgery  History of prior hysterectomy, otherwise no abdominopelvic surgeries.  History of prior lower abdominal liposuction No cardiovascular issues, no strokes heart attacks, no blood thinners     Prior HPI: On a lumbar spine MRI performed 06/2023, she was incidentally noted to have a lipomatous lesion of the lower pole of the right kidney. Renal mass protocol CT subsequently performed at [facility], which showed a 9.4 by 7.2 by 7.2 cm fat-containing renal mass consistent with angiomyolipoma.  No gross hematuria.She does have chronic back pain. S/p Right AML embolization (Jan 2025)    Physical Exam: BP (!) 141/60   Pulse (!) 56   Ht 5' 2 (1.575 m)   Wt 155 lb (70.3 kg)   BMI 28.35 kg/m    Constitutional:  Alert and oriented, No acute distress.  Laboratory Data:  Latest Reference Range & Units 05/02/24 14:13  Creatinine 0.57 - 1.00 mg/dL 8.88 (H)    Pertinent Imaging: I have personally viewed and interpreted the CT Abd (05/17/24) -large right renal AML measuring 8.2 cm in largest dimension, s/p IR embolization noted.  Mass located anterior interpolar location, overlying renal hilum.  1 renal artery, 1 renal vein.  Normal contralateral left kidney.     Assessment & Plan:    Renal angiomyolipoma Assessment & Plan: 8.2 cm Right anterior/interpolar AML    - s/p IR embo in Jan 2025, previously 9.5 cm  I had a thoughtful  conversation with her and her husband today.  I reviewed her clinical history, prior IR procedure and recent interval imaging.  She has 3 reasonable options: Continued expectant management with observation (acknowledging the uncertain relationship of her pain syndrome and renal mass effect, as well as theoretical risk of AML related hemorrhage which increases with size >4-5 cm diameter).  I also reviewed the role of repeat selective embolization.  Finally I reviewed the role of definitive surgical removal.  She would not be a good candidate for attempted partial nephrectomy given the size and location of this mass.  I would offer a right robotic simple nephrectomy.  I reviewed the procedure in detail, expected of perioperative pathway, outcomes, recovery and possible complications.  I noted the increased risk of intra-abdominal adhesions and perirenal adhesions with prior embolization therapy.  Ultimately she wanted to proceed with surgical removal and declined other conservative options.   - Schedule for OR: Right robotic simple nephrectomy, ~late Nov/early Dec 2025 timeframe   - she will need an additional brief pre-op with me within 30 days of surgery date - pre-op labs: CBC, BMP, coag panel, type and screen         Penne JONELLE Skye, MD  [facility] [address], [address] [place], [place] [address] [phone]

## 2024-05-27 NOTE — Assessment & Plan Note (Addendum)
 8.2 cm Right anterior/interpolar AML    - s/p IR embo in Jan 2025, previously 9.5 cm  I had a thoughtful conversation with her and her husband today.  I reviewed her clinical history, prior IR procedure and recent interval imaging.  She has 3 reasonable options: Continued expectant management with observation (acknowledging the uncertain relationship of her pain syndrome and renal mass effect, as well as theoretical risk of AML related hemorrhage which increases with size >4-5 cm diameter).  I also reviewed the role of repeat selective embolization.  Finally I reviewed the role of definitive surgical removal.  She would not be a good candidate for attempted partial nephrectomy given the size and location of this mass.  I would offer a right robotic simple nephrectomy.  I reviewed the procedure in detail, expected of perioperative pathway, outcomes, recovery and possible complications.  I noted the increased risk of intra-abdominal adhesions and perirenal adhesions with prior embolization therapy.  Ultimately she wanted to proceed with surgical removal and declined other conservative options.   - Schedule for OR: Right robotic simple nephrectomy, ~late Nov/early Dec 2025 timeframe   - she will need an additional brief pre-op with me within 30 days of surgery date - pre-op labs: CBC, BMP, coag panel, type and screen

## 2024-05-31 ENCOUNTER — Encounter

## 2024-06-01 ENCOUNTER — Ambulatory Visit (INDEPENDENT_AMBULATORY_CARE_PROVIDER_SITE_OTHER)

## 2024-06-01 VITALS — BP 135/76 | HR 53 | Ht 62.0 in | Wt 155.7 lb

## 2024-06-01 DIAGNOSIS — Z0000 Encounter for general adult medical examination without abnormal findings: Secondary | ICD-10-CM

## 2024-06-01 DIAGNOSIS — F419 Anxiety disorder, unspecified: Secondary | ICD-10-CM | POA: Diagnosis not present

## 2024-06-01 NOTE — Progress Notes (Signed)
 Complete physical exam   Patient: [name]   DOB: Jan 08, 1951   73 y.o. Female  MRN: [id number] Visit Date: 06/01/2024  Today's healthcare provider: Isaiah DELENA Pepper, MD   Chief Complaint  Patient presents with   Annual Exam    AWV completed 05/04/24 Mammogram ordered 05/02/24 Dexa ordered 05/02/24 Colonoscopy referral placed 05/02/24   Subjective    [name] is a 73 y.o. female who presents today for a complete physical exam.  She reports consuming a gluten free diet. Home exercise routine includes water aerobics 4x and walking nightly. She generally feels well. She reports sleeping well. She does have additional problems to discuss today.    Discussed the use of AI scribe software for clinical note transcription with the patient, who gave verbal consent to proceed.  History of Present Illness [name] is a 73 year old female who presents for an annual physical exam.  She has made lifestyle changes, including adopting a gluten-free diet and engaging in regular physical activity such as walking every night and participating in water aerobics four times a week. She reports weight loss over the past few weeks and improved blood pressure.  Her current medications include Lotrel for blood pressure and Lexapro , which was recently increased to 20 mg, resulting in significant improvement in her anxiety and mood.  No recent falls are reported, and she describes her sleep as 'great'. She has not started cholesterol medication as previously discussed.    Last depression screening scores    06/01/2024    2:40 PM 05/04/2024    1:08 PM 07/28/2023    8:58 AM  PHQ 2/9 Scores  PHQ - 2 Score 0 0 0  PHQ- 9 Score 0  3   Last fall risk screening    06/01/2024    2:40 PM  Fall Risk   Falls in the past year? 0  Number falls in past yr: 0  Injury with Fall? 0  Risk for fall due to : No Fall Risks  Follow up Falls evaluation completed     Vision:Within last year and Dental: No current dental problems and Receives regular dental care    Medications: Outpatient Medications Prior to Visit  Medication Sig   amLODipine -benazepril  (LOTREL) 10-40 MG capsule Take 1 capsule by mouth daily.   Ascorbic Acid (VITAMIN C) 100 MG tablet Take by mouth daily.   Cholecalciferol (VITAMIN D3) 2000 units TABS Take 5,000 Units by mouth daily.   escitalopram  (LEXAPRO ) 20 MG tablet Take 1 tablet (20 mg total) by mouth at bedtime.   famotidine  (PEPCID ) 40 MG tablet Take 1 tablet (40 mg total) by mouth daily.   Magnesium Citrate 100 MG TABS Take 100 mg by mouth daily.   Omega-3 Fatty Acids (FISH OIL) 1200 MG CAPS Take 1,290 mg by mouth daily.   Potassium 99 MG TABS Take 99 mg by mouth daily.   selenium 50 MCG TABS tablet Take 50 mcg by mouth daily.   Zinc Gluconate 50 MG CAPS Take 1 tablet by mouth daily.   No facility-administered medications prior to visit.    Review of Systems as noted in HPI.     Objective    BP 135/76 (BP Location: Left Arm, Patient Position: Sitting, Cuff Size: Normal)   Pulse (!) 53   Ht 5' 2 (1.575 m)   Wt 155 lb 11.2 oz (70.6 kg)   SpO2 95%   BMI 28.48 kg/m    Physical  Exam Constitutional:      Appearance: Normal appearance.  HENT:     Head: Normocephalic and atraumatic.     Mouth/Throat:     Mouth: Mucous membranes are moist.  Eyes:     Pupils: Pupils are equal, round, and reactive to light.  Cardiovascular:     Rate and Rhythm: Normal rate and regular rhythm.     Heart sounds: Normal heart sounds.  Pulmonary:     Effort: Pulmonary effort is normal.     Breath sounds: Normal breath sounds.  Musculoskeletal:     Right lower leg: No edema.     Left lower leg: No edema.  Skin:    General: Skin is warm.  Neurological:     General: No focal deficit present.     Mental Status: She is alert.      No results found for any visits on 06/01/24.  Assessment & Plan    Routine Health  Maintenance and Physical Exam  Exercise Activities and Dietary recommendations  Goals      Patient Stated     Lose 10 lbs by feb 26         Immunization History  Administered Date(s) Administered   Fluad Quad(high Dose 65+) 06/08/2019, 06/20/2020, 04/30/2022   INFLUENZA, HIGH DOSE SEASONAL PF 07/06/2017, 05/12/2018, 07/09/2023, 05/02/2024   PFIZER(Purple Top)SARS-COV-2 Vaccination 10/13/2019, 11/10/2019, 07/19/2020   Pfizer Covid-19 Vaccine Bivalent Booster 54yrs & up 10/13/2019, 11/10/2019   Pneumococcal Conjugate-13 05/12/2018   Pneumococcal Polysaccharide-23 06/08/2019   Td 06/08/2019   Tdap 03/14/2009    Health Maintenance  Topic Date Due   Zoster Vaccines- Shingrix (1 of 2) Never done   DEXA SCAN  11/03/2023   Mammogram  11/13/2023   Colonoscopy  04/12/2024   COVID-19 Vaccine (6 - 2025-26 season) 04/18/2024   Medicare Annual Wellness (AWV)  05/04/2025   DTaP/Tdap/Td (3 - Td or Tdap) 06/07/2029   Pneumococcal Vaccine: 50+ Years  Completed   Influenza Vaccine  Completed   Hepatitis C Screening  Completed   Meningococcal B Vaccine  Aged Out    Discussed health benefits of physical activity, and encouraged her to engage in regular exercise appropriate for her age and condition.  Problem List Items Addressed This Visit       Other   Anxiety   Other Visit Diagnoses       Encounter for annual physical exam    -  Primary      Assessment & Plan Adult Wellness Visit Improved blood pressure and weight loss due to dietary changes and increased physical activity. Good sleep quality, no recent falls, no leg swelling, no stomach pain. Plans for dental and eye appointments, regular dermatology visits. - Continue current exercise regimen including water aerobics and walking. - Maintain gluten-free diet. - Schedule dental appointment. - Visit eye doctor. - Continue regular dermatology visits.  Depression and anxiety Chronic, controlled. Depression and anxiety  improved with Lexapro  20 mg. Reports decreased anxiety and improved mood. - Continue Lexapro  at 20 mg daily.  Return in about 6 months (around 11/30/2024) for Follow Up.     Isaiah DELENA Pepper, MD  [facility] [phone] (phone) [phone] (fax)

## 2024-06-01 NOTE — Patient Instructions (Addendum)
 Please contact [phone] to schedule your mammogram and dexa scan. You will be asked your location preference to have procedure performed. You have two options listed below.  1) [facility] located at [address] [place], [place] [address] 2) [facility] located at [address] [place], [place] [address]  Upon results being received our office will contact you.  A referral has been placed on your behalf to [facility] Located at [address] [place] [address] . Our referral coordination team or the office you will be visiting will contact you within the next 10 business days. If you have not received a phone call within 10 business days please contact them at [phone].  Please feel free to contact us  if you have any further questions or concerns.  Thank you for allowing us  to assist you with proper care.  [facility]

## 2024-06-02 ENCOUNTER — Ambulatory Visit (INDEPENDENT_AMBULATORY_CARE_PROVIDER_SITE_OTHER): Admitting: Urology

## 2024-06-02 ENCOUNTER — Ambulatory Visit (AMBULATORY_SURGERY_CENTER): Admitting: *Deleted

## 2024-06-02 ENCOUNTER — Other Ambulatory Visit: Payer: Self-pay

## 2024-06-02 VITALS — BP 141/60 | HR 56 | Ht 62.0 in | Wt 155.0 lb

## 2024-06-02 VITALS — Ht 62.0 in | Wt 155.0 lb

## 2024-06-02 DIAGNOSIS — Z860100 Personal history of colon polyps, unspecified: Secondary | ICD-10-CM

## 2024-06-02 DIAGNOSIS — D1771 Benign lipomatous neoplasm of kidney: Secondary | ICD-10-CM | POA: Diagnosis not present

## 2024-06-02 MED ORDER — NA SULFATE-K SULFATE-MG SULF 17.5-3.13-1.6 GM/177ML PO SOLN: 1.0000 | Freq: Once | ORAL | 0 refills | Status: AC

## 2024-06-02 NOTE — Progress Notes (Signed)
 Surgical Physician Order Form [facility]  Dr. Penne Skye, MD  * Scheduling expectation : Next Available (w/ Dr. Francisca assisting)  *Length of Case: 2 hours  *Clearance needed: no  *Anticoagulation Instructions: N/A  *Aspirin Instructions: N/A  *Post-op visit Date/Instructions:  1 month follow up  *Diagnosis: Right AML  *Procedure: right Laparoscopic radical nephrectomy (robot or hand assist)(50545)   Additional orders: N/A  -Admit type: Observation  -Anesthesia: General  -VTE Prophylaxis Standing Order SCD's       Other:   -Standing Lab Orders Per Anesthesia    Lab other: CBC, BMP, coag panel, type and screen  -Standing Test orders EKG/Chest x-ray per Anesthesia       Test other:   - Medications:  Ancef 2gm IV  -Other orders:  N/A

## 2024-06-02 NOTE — Progress Notes (Addendum)
 Pt's name and DOB verified at the beginning of the pre-visit with 2 identifiers  Pt denies any difficulty with ambulating,sitting, laying down or rolling side to side  Pt has no issues moving head neck or swallowing  No egg or soy allergy known to patient   No issues known to pt with past sedation  No FH of Malignant Hyperthermia  Pt is not on home 02   Pt is not on blood thinners   Pt has frequent issues with constipation RN instructed pt to use Miralax per bottles instructions a week before prep days. Pt states they will  Pt is not on dialysis  Pt denise any abnormal heart rhythms   Pt denies any upcoming cardiac testing  Chart not reviewed by CRNA prior to PV  Visit by phone  Pt states weight is 155 lb   Pt given  both [facility] main # and MD on call # prior to instructions.  Informed pt to come in at the time discussed and is shown on PV instructions.  Pt instructed to use Singlecare.com or GoodRx for a price reduction on prep  Instructed pt where to find PV instructions in My Ch. Copy of instructions  to be sent in mail and address read back to pt to verify correct on envelope. Instructed pt on all aspects of written instructions including med holds clothing to wear and foods to eat and not eat as well as after procedure legal restrictions and to call MD on call if needed.. Pt states understanding. Instructed pt to review instructions again prior to procedure and call main # given if has any questions or any issues. Pt states they will.

## 2024-06-06 ENCOUNTER — Telehealth: Payer: Self-pay

## 2024-06-06 NOTE — Progress Notes (Signed)
   [facility] Surgical Posting Form  Surgery Date: Date: 08/02/2024  Surgeon: Dr. Penne Skye, MD, Dr. 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 Burnet to Assist

## 2024-06-06 NOTE — Telephone Encounter (Signed)
 Per Dr. Georganne, Patient is to be scheduled for Right Robotic Laparoscopic Radical Nephrectomy   [name] was contacted and possible surgical dates were discussed, Tuesday December 16th, 2025 was agreed upon for surgery.   Patient was directed to call [phone] between 1-3pm the day before surgery to find out surgical arrival time.  Instructions were given not to eat or drink from midnight on the night before surgery and have a driver for the day of surgery. On the surgery day patient was instructed to enter through the [facility] entrance of [facility] report the Same Day Surgery desk.   Pre-Admit Testing will be in contact via phone to set up an interview with the anesthesia team to review your history and medications prior to surgery.   Reminder of this information was sent via MyChart to the patient.

## 2024-06-15 ENCOUNTER — Encounter: Payer: Self-pay | Admitting: Gastroenterology

## 2024-06-22 ENCOUNTER — Encounter: Payer: Self-pay | Admitting: Gastroenterology

## 2024-06-22 ENCOUNTER — Ambulatory Visit: Admitting: Gastroenterology

## 2024-06-22 VITALS — BP 112/61 | HR 48 | Temp 97.3°F | Resp 16 | Ht 62.0 in | Wt 155.0 lb

## 2024-06-22 DIAGNOSIS — Z1211 Encounter for screening for malignant neoplasm of colon: Secondary | ICD-10-CM

## 2024-06-22 DIAGNOSIS — D125 Benign neoplasm of sigmoid colon: Secondary | ICD-10-CM

## 2024-06-22 DIAGNOSIS — K635 Polyp of colon: Secondary | ICD-10-CM | POA: Diagnosis not present

## 2024-06-22 DIAGNOSIS — D120 Benign neoplasm of cecum: Secondary | ICD-10-CM

## 2024-06-22 DIAGNOSIS — D123 Benign neoplasm of transverse colon: Secondary | ICD-10-CM | POA: Diagnosis not present

## 2024-06-22 DIAGNOSIS — Z860101 Personal history of adenomatous and serrated colon polyps: Secondary | ICD-10-CM

## 2024-06-22 DIAGNOSIS — Z860100 Personal history of colon polyps, unspecified: Secondary | ICD-10-CM

## 2024-06-22 MED ORDER — SODIUM CHLORIDE 0.9 % IV SOLN: 500.0000 mL | INTRAVENOUS | Status: DC

## 2024-06-22 NOTE — Progress Notes (Unsigned)
 Vss nad trans to pacu

## 2024-06-22 NOTE — Patient Instructions (Signed)
Thank you for letting us take care of your healthcare needs today. PLease see handouts given to you on Polyps.    YOU HAD AN ENDOSCOPIC PROCEDURE TODAY AT THE [facility]:   Refer to the procedure report that was given to you for any specific questions about what was found during the examination.  If the procedure report does not answer your questions, please call your gastroenterologist to clarify.  If you requested that your care partner not be given the details of your procedure findings, then the procedure report has been included in a sealed envelope for you to review at your convenience later.  YOU SHOULD EXPECT: Some feelings of bloating in the abdomen. Passage of more gas than usual.  Walking can help get rid of the air that was put into your GI tract during the procedure and reduce the bloating. If you had a lower endoscopy (such as a colonoscopy or flexible sigmoidoscopy) you may notice spotting of blood in your stool or on the toilet paper. If you underwent a bowel prep for your procedure, you may not have a normal bowel movement for a few days.  Please Note:  You might notice some irritation and congestion in your nose or some drainage.  This is from the oxygen used during your procedure.  There is no need for concern and it should clear up in a day or so.  SYMPTOMS TO REPORT IMMEDIATELY:  Following lower endoscopy (colonoscopy or flexible sigmoidoscopy):  Excessive amounts of blood in the stool  Significant tenderness or worsening of abdominal pains  Swelling of the abdomen that is new, acute  Fever of 100F or higher   For urgent or emergent issues, a gastroenterologist can be reached at any hour by calling [phone]. Do not use MyChart messaging for urgent concerns.    DIET:  We do recommend a small meal at first, but then you may proceed to your regular diet.  Drink plenty of fluids but you should avoid alcoholic beverages for 24 hours.  ACTIVITY:  You  should plan to take it easy for the rest of today and you should NOT DRIVE or use heavy machinery until tomorrow (because of the sedation medicines used during the test).    FOLLOW UP: Our staff will call the number listed on your records the next business day following your procedure.  We will call around 7:15- 8:00 am to check on you and address any questions or concerns that you may have regarding the information given to you following your procedure. If we do not reach you, we will leave a message.     If any biopsies were taken you will be contacted by phone or by letter within the next 1-3 weeks.  Please call us at [phone] if you have not heard about the biopsies in 3 weeks.    SIGNATURES/CONFIDENTIALITY: You and/or your care partner have signed paperwork which will be entered into your electronic medical record.  These signatures attest to the fact that that the information above on your After Visit Summary has been reviewed and is understood.  Full responsibility of the confidentiality of this discharge information lies with you and/or your care-partner.

## 2024-06-22 NOTE — Progress Notes (Unsigned)
 Pt's states no medical or surgical changes since previsit or office visit.

## 2024-06-22 NOTE — Op Note (Signed)
 [facility] Patient Name: [name] Procedure Date: 06/22/2024 1:21 PM MRN: [id number] Endoscopist: Gustav ALONSO Mcgee , MD, [id number] Age: 73 Referring MD:  Date of Birth: Dec 28, 1950 Gender: Female Account #: [id number] Procedure:                Colonoscopy Indications:              High risk colon cancer surveillance: Personal                            history of colonic polyps Medicines:                Monitored Anesthesia Care Procedure:                Pre-Anesthesia Assessment:                           - Prior to the procedure, a History and Physical                            was performed, and patient medications and                            allergies were reviewed. The patient's tolerance of                            previous anesthesia was also reviewed. The risks                            and benefits of the procedure and the sedation                            options and risks were discussed with the patient.                            All questions were answered, and informed consent                            was obtained. Prior Anticoagulants: The patient has                            taken no anticoagulant or antiplatelet agents. ASA                            Grade Assessment: II - A patient with mild systemic                            disease. After reviewing the risks and benefits,                            the patient was deemed in satisfactory condition to                            undergo the procedure.  After obtaining informed consent, the colonoscope                            was passed under direct vision. Throughout the                            procedure, the patient's blood pressure, pulse, and                            oxygen saturations were monitored continuously. The                            Olympus Scope [id number] was introduced through the                            anus and advanced to the  the cecum, identified by                            appendiceal orifice and ileocecal valve. The                            colonoscopy was performed without difficulty. The                            patient tolerated the procedure well. The quality                            of the bowel preparation was good. The ileocecal                            valve, appendiceal orifice, and rectum were                            photographed. Scope In: 1:51:55 PM Scope Out: 2:10:46 PM Scope Withdrawal Time: 0 hours 9 minutes 54 seconds  Total Procedure Duration: 0 hours 18 minutes 51 seconds  Findings:                 The perianal and digital rectal examinations were                            normal.                           Four sessile polyps were found in the sigmoid colon                            X 1, transverse colon X 1 and cecum X 2. The polyps                            were 3 to 11 mm in size. These polyps were removed                            with a cold snare. Resection and retrieval  were                            complete. Complications:            No immediate complications. Estimated Blood Loss:     Estimated blood loss was minimal. Impression:               - Four 3 to 11 mm polyps in the sigmoid colon, in                            the transverse colon and in the cecum, removed with                            a cold snare. Resected and retrieved. Recommendation:           - Patient has a contact number available for                            emergencies. The signs and symptoms of potential                            delayed complications were discussed with the                            patient. Return to normal activities tomorrow.                            Written discharge instructions were provided to the                            patient.                           - Resume previous diet.                           - Continue present medications.                            - Await pathology results.                           - Repeat colonoscopy in 3 years for surveillance                            based on pathology results. Sheron Robin V. Evie Croston, MD 06/22/2024 2:23:42 PM This report has been signed electronically.

## 2024-06-22 NOTE — Progress Notes (Signed)
 Called to room to assist during endoscopic procedure.  Patient ID and intended procedure confirmed with present staff. Received instructions for my participation in the procedure from the performing physician.

## 2024-06-22 NOTE — Progress Notes (Unsigned)
 [name]   Primary Care Physician:  Stacey Isaiah LABOR, MD   Reason for Procedure:  History of adenomatous colon polyps  Plan:    Surveillance colonoscopy with possible interventions as needed     HPI: [name] is a very pleasant 73 y.o. female here for surveillance colonoscopy. Denies any nausea, vomiting, abdominal pain, melena or bright red blood per rectum  The risks and benefits as well as alternatives of endoscopic procedure(s) have been discussed and reviewed.  The patient was provided an opportunity to ask questions and all were answered. The patient agreed with the plan and demonstrated an understanding of the instructions.   Past Medical History:  Diagnosis Date   Actinic keratosis    Allergy    Anxiety    Breast mass 10/07/2016   left 2:00   Depression    GERD (gastroesophageal reflux disease)    Hypertension    Osteopenia     Past Surgical History:  Procedure Laterality Date   ABDOMINAL HYSTERECTOMY  1988   complete; due to fibroids   BLADDER REPAIR  2007   BREAST BIOPSY Left 11/25/2022   US  Axilla Bx, Path pending   CHOLECYSTECTOMY  2003   COLONOSCOPY  04/16/2009   IR EMBO TUMOR ORGAN ISCHEMIA INFARCT INC GUIDE ROADMAPPING  09/14/2023   IR RADIOLOGIST EVAL & MGMT  09/01/2023   IR RADIOLOGIST EVAL & MGMT  09/24/2023   KNEE SURGERY Left 2012   TONSILLECTOMY  1960    Prior to Admission medications   Medication Sig Start Date End Date Taking? Authorizing Provider  amLODipine -benazepril  (LOTREL) 10-40 MG capsule Take 1 capsule by mouth daily. 05/02/24   Stacey Isaiah LABOR, MD  Ascorbic Acid (VITAMIN C) 100 MG tablet Take by mouth daily.    [provider]  Cholecalciferol (VITAMIN D3) 2000 units TABS Take 5,000 Units by mouth daily.    [provider]  escitalopram  (LEXAPRO ) 20 MG tablet Take 1 tablet (20 mg total) by mouth at bedtime. 05/02/24   Stacey Isaiah LABOR, MD  famotidine  (PEPCID ) 40 MG tablet  Take 1 tablet (40 mg total) by mouth daily. Patient taking differently: Take 40 mg by mouth as needed. 05/02/24   Stacey Isaiah LABOR, MD  magnesium gluconate (MAGONATE) 500 (27 Mg) MG TABS tablet Take 500 mg by mouth in the morning and at bedtime.    [provider]  Omega-3 Fatty Acids (FISH OIL) 1200 MG CAPS Take 1,290 mg by mouth daily.    [provider]  Potassium 99 MG TABS Take 99 mg by mouth daily.    [provider]  selenium 50 MCG TABS tablet Take 50 mcg by mouth daily.    [provider]  Zinc Gluconate 50 MG CAPS Take 1 tablet by mouth daily.    [provider]    Current Outpatient Medications  Medication Sig Dispense Refill   amLODipine -benazepril  (LOTREL) 10-40 MG capsule Take 1 capsule by mouth daily. 90 capsule 3   Ascorbic Acid (VITAMIN C) 100 MG tablet Take by mouth daily.     Cholecalciferol (VITAMIN D3) 2000 units TABS Take 5,000 Units by mouth daily.     escitalopram  (LEXAPRO ) 20 MG tablet Take 1 tablet (20 mg total) by mouth at bedtime. 90 tablet 3   famotidine  (PEPCID ) 40 MG tablet Take 1 tablet (40 mg total) by mouth daily. (Patient taking differently: Take 40 mg by mouth as needed.) 90 tablet 3   magnesium gluconate (MAGONATE) 500 (  27 Mg) MG TABS tablet Take 500 mg by mouth in the morning and at bedtime.     Omega-3 Fatty Acids (FISH OIL) 1200 MG CAPS Take 1,290 mg by mouth daily.     Potassium 99 MG TABS Take 99 mg by mouth daily.     selenium 50 MCG TABS tablet Take 50 mcg by mouth daily.     Zinc Gluconate 50 MG CAPS Take 1 tablet by mouth daily.     Current Facility-Administered Medications  Medication Dose Route Frequency Provider Last Rate Last Admin   0.9 %  sodium chloride  infusion  500 mL Intravenous Continuous Stacey Corales V, MD        Allergies as of 06/22/2024   (No Known Allergies)    Family History  Problem Relation Age of Onset   Cervical cancer Mother    COPD Mother    COPD Father     Hypertension Father    Colon cancer Neg Hx    Colon polyps Neg Hx    Esophageal cancer Neg Hx    Rectal cancer Neg Hx    Stomach cancer Neg Hx     Social History   Socioeconomic History   Marital status: Married    Spouse name: [name]   Number of children: 2   Years of education: Not on file   Highest education level: Some college, no degree  Occupational History   Occupation: retired  Tobacco Use   Smoking status: Never   Smokeless tobacco: Never  Vaping Use   Vaping status: Never Used  Substance and Sexual Activity   Alcohol  use: No   Drug use: No   Sexual activity: Not Currently  Other Topics Concern   Not on file  Social History Narrative   Not on file   Social Drivers of Health   Financial Resource Strain: Low Risk  (04/30/2024)   Overall Financial Resource Strain (CARDIA)    Difficulty of Paying Living Expenses: Not very hard  Food Insecurity: No Food Insecurity (04/30/2024)   Hunger Vital Sign    Worried About Running Out of Food in the Last Year: Never true    Ran Out of Food in the Last Year: Never true  Transportation Needs: No Transportation Needs (04/30/2024)   PRAPARE - Administrator, Civil Service (Medical): No    Lack of Transportation (Non-Medical): No  Alexander Activity: Sufficiently Active (04/30/2024)   Exercise Vital Sign    Days of Exercise per Week: 6 days    Minutes of Exercise per Session: 30 min  Stress: No Stress Concern Present (04/30/2024)   [name]    Feeling of Stress: Not at all  Social Connections: Socially Integrated (04/30/2024)   Social Connection and Isolation Panel    Frequency of Communication with Friends and Family: More than three times a week    Frequency of Social Gatherings with Friends and Family: More than three times a week    Attends Religious Services: More than 4 times per year    Active Member of [employer] or Organizations: Yes     Attends Banker Meetings: More than 4 times per year    Marital Status: Married  Catering Manager Violence: Not At Risk (05/04/2024)   Humiliation, Afraid, Rape, and Kick Alexander    Fear of Current or Ex-Partner: No    Emotionally Abused: No    Physically Abused: No    Sexually Abused: No  Review of Systems:  All other review of systems negative except as mentioned in the HPI.  Alexander Exam: Vital signs in last 24 hours: BP 137/62   Pulse 62   Temp (!) 97.3 F (36.3 C) (Temporal)   Ht 5' 2 (1.575 m)   Wt 155 lb (70.3 kg)   SpO2 94%   BMI 28.35 kg/m  General:   Alert, NAD Lungs:  Clear .   Heart:  Regular rate and rhythm Abdomen:  Soft, nontender and nondistended. Neuro/Psych:  Alert and cooperative. Normal mood and affect. A and O x 3  Reviewed labs, radiology imaging, old records and pertinent past GI work up  Patient is appropriate for planned procedure(s) and anesthesia in an ambulatory setting   K. Veena Zyon Rosser , MD [phone]

## 2024-06-23 ENCOUNTER — Telehealth: Payer: Self-pay

## 2024-06-23 NOTE — Telephone Encounter (Signed)
 Left message on follow up call.

## 2024-06-27 LAB — SURGICAL PATHOLOGY

## 2024-06-29 ENCOUNTER — Ambulatory Visit: Payer: Self-pay | Admitting: Gastroenterology

## 2024-07-06 ENCOUNTER — Ambulatory Visit
Admission: RE | Admit: 2024-07-06 | Discharge: 2024-07-06 | Disposition: A | Discharge status: Home / Self Care | Source: Ambulatory Visit

## 2024-07-06 DIAGNOSIS — Z1231 Encounter for screening mammogram for malignant neoplasm of breast: Secondary | ICD-10-CM | POA: Insufficient documentation

## 2024-07-06 DIAGNOSIS — M8589 Other specified disorders of bone density and structure, multiple sites: Secondary | ICD-10-CM | POA: Insufficient documentation

## 2024-07-06 DIAGNOSIS — M8580 Other specified disorders of bone density and structure, unspecified site: Secondary | ICD-10-CM | POA: Diagnosis present

## 2024-07-18 ENCOUNTER — Ambulatory Visit: Admission: RE | Admit: 2024-07-18 | Discharge: 2024-07-18 | Disposition: A | Source: Ambulatory Visit

## 2024-07-18 DIAGNOSIS — R918 Other nonspecific abnormal finding of lung field: Secondary | ICD-10-CM | POA: Diagnosis present

## 2024-07-22 ENCOUNTER — Telehealth: Admitting: Family Medicine

## 2024-07-22 ENCOUNTER — Ambulatory Visit: Payer: Self-pay

## 2024-07-22 DIAGNOSIS — J4531 Mild persistent asthma with (acute) exacerbation: Secondary | ICD-10-CM | POA: Diagnosis not present

## 2024-07-22 MED ORDER — AZITHROMYCIN 250 MG PO TABS: ORAL_TABLET | ORAL | 0 refills | Status: AC

## 2024-07-22 MED ORDER — PREDNISONE 20 MG PO TABS: 20.0000 mg | ORAL_TABLET | Freq: Two times a day (BID) | ORAL | 0 refills | Status: AC

## 2024-07-22 MED ORDER — PROMETHAZINE-DM 6.25-15 MG/5ML PO SYRP: 5.0000 mL | ORAL_SOLUTION | Freq: Four times a day (QID) | ORAL | 0 refills | Status: AC | PRN

## 2024-07-22 NOTE — Telephone Encounter (Signed)
 FYI Only or Action Required?: FYI only for provider: appointment scheduled on 12.5.25 with virtual UC.  Patient was last seen in primary care on 06/01/2024 by Franchot Isaiah LABOR, MD.  Called Nurse Triage reporting Cough.  Symptoms began several days ago.  Interventions attempted: Nothing.  Symptoms are: gradually worsening.  Triage Disposition: See Physician Within 24 Hours  Patient/caregiver understands and will follow disposition?: Yes   Copied from CRM #[id number]. Topic: Clinical - Medication Question >> Jul 22, 2024  9:13 AM Travis F wrote: Reason for CRM: Patient is calling in because she's coughing, has a runny nose, and not feeling well. Patient says she is requesting some medication to help with the symptoms because she wants to be well by her surgery date. Patient says the medication can be sent to [facility] on [address] in [place]. Reason for Disposition  [1] Continuous (nonstop) coughing interferes with work or school AND [2] no improvement using cough treatment per Care Advice  Answer Assessment - Initial Assessment Questions Started Tuesday with cough, runny nose, getting progressively worse. States starting to go down chest. She is concerned bc she has a surgery coming up in a couple of weeks. She states cough is deep, light green sputum and smells off. She denies any fever. States shortness of breath with coughing or laying down. Denies any other high acuity symptoms. Scheduled for virtual UC.     1. ONSET: When did the cough begin?      Tuesday 2. SEVERITY: How bad is the cough today?      moderate 3. SPUTUM: Describe the color of your sputum (e.g., none, dry cough; clear, white, yellow, green)     Light green 4. HEMOPTYSIS: Are you coughing up any blood? If Yes, ask: How much? (e.g., flecks, streaks, tablespoons, etc.)     denies 5. DIFFICULTY BREATHING: Are you having difficulty breathing? If Yes, ask: How bad is it? (e.g., mild, moderate, severe)       With coughing or laying down 6. FEVER: Do you have a fever? If Yes, ask: What is your temperature, how was it measured, and when did it start?     denies 7. CARDIAC HISTORY: Do you have any history of heart disease? (e.g., heart attack, congestive heart failure)      htn 8. LUNG HISTORY: Do you have any history of lung disease?  (e.g., pulmonary embolus, asthma, emphysema)     Pulm nodules 9. OTHER SYMPTOMS: Do you have any other symptoms? (e.g., runny nose, wheezing, chest pain)       Runny nose  Protocols used: Cough - Acute Productive-A-AH

## 2024-07-22 NOTE — Progress Notes (Signed)
 Virtual Visit Consent   [name], you are scheduled for a virtual visit with a [facility] provider today. Just as with appointments in the office, your consent must be obtained to participate. Your consent will be active for this visit and any virtual visit you may have with one of our providers in the next 365 days. If you have a MyChart account, a copy of this consent can be sent to you electronically.  As this is a virtual visit, video technology does not allow for your provider to perform a traditional examination. This may limit your provider's ability to fully assess your condition. If your provider identifies any concerns that need to be evaluated in person or the need to arrange testing (such as labs, EKG, etc.), we will make arrangements to do so. Although advances in technology are sophisticated, we cannot ensure that it will always work on either your end or our end. If the connection with a video visit is poor, the visit may have to be switched to a telephone visit. With either a video or telephone visit, we are not always able to ensure that we have a secure connection.  By engaging in this virtual visit, you consent to the provision of healthcare and authorize for your insurance to be billed (if applicable) for the services provided during this visit. Depending on your insurance coverage, you may receive a charge related to this service.  I need to obtain your verbal consent now. Are you willing to proceed with your visit today? [name] has provided verbal consent on 07/22/2024 for a virtual visit (video or telephone). Loa Lamp, FNP  Date: 07/22/2024 5:24 PM   Virtual Visit via Video Note   I, Loa Lamp, connected with  [name]  ([id number], Jan 11, 1951) on 07/22/24 at  5:30 PM EST by a video-enabled telemedicine application and verified that I am speaking with the correct person using two identifiers.  Location: Patient: Virtual Visit Location Patient:  Home Provider: Virtual Visit Location Provider: Home Office   I discussed the limitations of evaluation and management by telemedicine and the availability of in person appointments. The patient expressed understanding and agreed to proceed.    History of Present Illness: [name] is a 73 y.o. who identifies as a female who was assigned female at birth, and is being seen today for runny, nose, cough keeping her up at night, mucinex helping a little. Having surgery the 16th- rt kidney removal. Mild wheezing. No fever. Foul taste to mucus.   HPI: HPI  Problems:  Patient Active Problem List   Diagnosis Date Noted   Osteopenia 05/02/2024   Pulmonary nodules 05/02/2024   Mixed hyperlipidemia 05/02/2024   Renal angiomyolipoma 05/02/2024   Annual physical exam 05/30/2022   Screening mammogram for breast cancer 05/30/2022   Gastroesophageal reflux disease without esophagitis 07/18/2021   Hypertensive disorder 11/03/2018   Anxiety 01/23/2015    Allergies: No Known Allergies Medications:  Current Outpatient Medications:    azithromycin  (ZITHROMAX ) 250 MG tablet, Take 2 tablets on day 1, then 1 tablet daily on days 2 through 5, Disp: 6 tablet, Rfl: 0   predniSONE  (DELTASONE ) 20 MG tablet, Take 1 tablet (20 mg total) by mouth 2 (two) times daily with a meal for 5 days., Disp: 10 tablet, Rfl: 0   promethazine -dextromethorphan (PROMETHAZINE -DM) 6.25-15 MG/5ML syrup, Take 5 mLs by mouth 4 (four) times daily as needed for up to 10 days for cough., Disp: 118 mL, Rfl: 0  amLODipine -benazepril  (LOTREL) 10-40 MG capsule, Take 1 capsule by mouth daily. (Patient taking differently: Take 1 capsule by mouth at bedtime.), Disp: 90 capsule, Rfl: 3   Ascorbic Acid (VITAMIN C) 100 MG tablet, Take by mouth daily., Disp: , Rfl:    Cholecalciferol (VITAMIN D3) 2000 units TABS, Take 5,000 Units by mouth in the morning., Disp: , Rfl:    [employer] CALCIUM CITRATE PO, Take 1 tablet by mouth in the morning., Disp: ,  Rfl:    cyanocobalamin (VITAMIN B12) 1000 MCG tablet, Take 1,000 mcg by mouth in the morning., Disp: , Rfl:    escitalopram  (LEXAPRO ) 20 MG tablet, Take 1 tablet (20 mg total) by mouth at bedtime., Disp: 90 tablet, Rfl: 3   famotidine  (PEPCID ) 40 MG tablet, Take 1 tablet (40 mg total) by mouth daily. (Patient taking differently: Take 40 mg by mouth daily as needed for heartburn or indigestion.), Disp: 90 tablet, Rfl: 3   MAGNESIUM GLYCINATE PO, Take 2 capsules by mouth in the morning., Disp: , Rfl:    Omega-3 Fatty Acids (FISH OIL PO), Take 1 capsule by mouth daily., Disp: , Rfl:    Potassium 99 MG TABS, Take 99 mg by mouth in the morning., Disp: , Rfl:    SELENIUM PO, Take 1 capsule by mouth in the morning., Disp: , Rfl:    TURMERIC PO, Take 2,000 mg by mouth in the morning., Disp: , Rfl:    zinc sulfate, 50mg  elemental zinc, 220 (50 Zn) MG capsule, Take 220 mg by mouth in the morning., Disp: , Rfl:   Observations/Objective: Patient is well-developed, well-nourished in no acute distress.  Resting comfortably  at home.  Head is normocephalic, atraumatic.  No labored breathing.  Speech is clear and coherent with logical content.  Patient is alert and oriented at baseline.    Assessment and Plan: 1. Mild persistent asthmatic bronchitis with acute exacerbation (Primary)  Increase fluids humidifier at night, UC if sx persist or worsen.   Follow Up Instructions: I discussed the assessment and treatment plan with the patient. The patient was provided an opportunity to ask questions and all were answered. The patient agreed with the plan and demonstrated an understanding of the instructions.  A copy of instructions were sent to the patient via MyChart unless otherwise noted below.     The patient was advised to call back or seek an in-person evaluation if the symptoms worsen or if the condition fails to improve as anticipated.    Mishayla Sliwinski, FNP

## 2024-07-22 NOTE — Patient Instructions (Signed)

## 2024-07-26 ENCOUNTER — Other Ambulatory Visit: Payer: Self-pay

## 2024-07-26 ENCOUNTER — Inpatient Hospital Stay
Admission: RE | Admit: 2024-07-26 | Discharge: 2024-07-26 | Disposition: A | Source: Ambulatory Visit | Attending: Urology

## 2024-07-26 NOTE — Patient Instructions (Addendum)
 Your procedure is scheduled on: Tuesday 08/02/24 Report to the Registration Desk on the 1st floor of the [facility]. To find out your arrival time, please call [phone] between 1PM - 3PM on: Monday 08/01/24 If your arrival time is 6:00 am, do not arrive before that time as the [facility] entrance doors do not open until 6:00 am.  REMEMBER: Instructions that are not followed completely may result in serious medical risk, up to and including death; or upon the discretion of your surgeon and anesthesiologist your surgery may need to be rescheduled.  Do not eat food or drink any liquids after midnight the night before surgery.  No gum chewing or hard candies.  One week prior to surgery: Stop Anti-inflammatories (NSAIDS) such as Advil, Aleve, Ibuprofen, Motrin, Naproxen, Naprosyn and Aspirin based products such as Excedrin, Goody's Powder, BC Powder.  You may however, continue to take Tylenol  if needed for pain up until the day of surgery  Stop ANY OVER THE COUNTER supplements and vitamins until after surgery.  Continue taking all of your other prescription medications up until the day of surgery.  ON THE DAY OF SURGERY ONLY TAKE THESE MEDICATIONS WITH SIPS OF WATER:  None (take your bedtime prescription medications as ususal the night before)  No Alcohol  for 24 hours before or after surgery.  No Smoking including e-cigarettes for 24 hours before surgery.  No chewable tobacco products for at least 6 hours before surgery.  No nicotine patches on the day of surgery.  Do not use any recreational drugs for at least a week (preferably 2 weeks) before your surgery.  Please be advised that the combination of cocaine and anesthesia may have negative outcomes, up to and including death. If you test positive for cocaine, your surgery will be cancelled.  On the morning of surgery brush your teeth with toothpaste and water, you may rinse your mouth with mouthwash if you wish. Do not  swallow any toothpaste or mouthwash.  Use CHG Soap or wipes as directed on instruction sheet.  Do not shave body hair from the neck down 48 hours before surgery.  Do not wear lotions, powders, or perfumes.  Wear comfortable clothing (specific to your surgery type) to the hospital.  Do not wear jewelry, make-up, hairpins, clips or nail polish.  For welded (permanent) jewelry: bracelets, anklets, waist bands, etc.  Please have this removed prior to surgery.  If it is not removed, there is a chance that hospital personnel will need to cut it off on the day of surgery.  Contact lenses, hearing aids and dentures may not be worn into surgery.  Do not bring valuables to the hospital. [facility] is not responsible for any missing/lost belongings or valuables.   Notify your doctor if there is any change in your medical condition (cold, fever, infection).  After surgery, you can help prevent lung complications by doing breathing exercises.  Take deep breaths and cough every 1-2 hours. Your doctor may order a device called an Incentive Spirometer to help you take deep breaths.  When coughing or sneezing, hold a pillow firmly against your incision with your hands. This is called "splinting." Doing this helps protect your incision. It also decreases  discomfort.  If you are being admitted to the hospital overnight, leave your suitcase in the car. After surgery it may be brought to your room.  In case of increased patient census, it may be necessary for you, the patient, to continue your postoperative care in  the Same Day Surgery department.  Please call the Pre-admissions Testing Dept. at [phone] if you have any questions about these instructions.  Surgery Visitation Policy:  Patients having surgery or a procedure may have two visitors.  Children under the age of 40 must have an adult with them who is not the patient.  Inpatient Visitation:    Visiting hours are 7 a.m. to 8 p.m. Up  to four visitors are allowed at one time in a patient room. The visitors may rotate out with other people during the day.  One visitor age 74 or older may stay with the patient overnight and must be in the room by 8 p.m.   Merchandiser, Retail to address health-related social needs:  [email or web address]                                                                                                             Preparing for Surgery with CHLORHEXIDINE GLUCONATE (CHG) Soap  Chlorhexidine Gluconate (CHG) Soap  o An antiseptic cleaner that kills germs and bonds with the skin to continue killing germs even after washing  o Used for showering the night before surgery and morning of surgery  Before surgery, you can play an important role by reducing the number of germs on your skin.  CHG (Chlorhexidine gluconate) soap is an antiseptic cleanser which kills germs and bonds with the skin to continue killing germs even after washing.  Please do not use if you have an allergy to CHG or antibacterial soaps. If your skin becomes reddened/irritated stop using the CHG.  1. Shower the NIGHT BEFORE SURGERY with CHG soap.  2. If you choose to wash your hair, wash your hair first as usual with your normal shampoo.  3. After shampooing, rinse your hair and body thoroughly to remove the shampoo.  4. Use CHG as you would any other liquid soap. You can apply CHG directly to the skin and wash gently with a clean washcloth.  5. Apply the CHG soap to your body only from the neck down. Do not use on open wounds or open sores. Avoid contact with your eyes, ears, mouth, and genitals (private parts). Wash face and genitals (private parts) with your normal soap.  6. Wash thoroughly, paying special attention to the area where your surgery will be performed.  7. Thoroughly rinse your body with warm water.  8. Do not shower/wash with your normal soap after using and rinsing off the CHG  soap.  9. Do not use lotions, oils, etc., after showering with CHG.  10. Pat yourself dry with a clean towel.  11. Wear clean pajamas to bed the night before surgery.  12. Place clean sheets on your bed the night of your shower and do not sleep with pets.  13. Do not apply any deodorants/lotions/powders.  14. Please wear clean clothes to the hospital.  15. Remember to brush your teeth with your regular toothpaste.  How to Use an Incentive Spirometer  An incentive spirometer is a tool that measures how  well you are filling your lungs with each breath. Learning to take long, deep breaths using this tool can help you keep your lungs clear and active. This may help to reverse or lessen your chance of developing breathing (pulmonary) problems, especially infection. You may be asked to use a spirometer: After a surgery. If you have a lung problem or a history of smoking. After a long period of time when you have been unable to move or be active. If the spirometer includes an indicator to show the highest number that you have reached, your health care provider or respiratory therapist will help you set a goal. Keep a log of your progress as told by your health care provider. What are the risks? Breathing too quickly may cause dizziness or cause you to pass out. Take your time so you do not get dizzy or light-headed. If you are in pain, you may need to take pain medicine before doing incentive spirometry. It is harder to take a deep breath if you are having pain. How to use your incentive spirometer  Sit up on the edge of your bed or on a chair. Hold the incentive spirometer so that it is in an upright position. Before you use the spirometer, breathe out normally. Place the mouthpiece in your mouth. Make sure your lips are closed tightly around it. Breathe in slowly and as deeply as you can through your mouth, causing the piston or the ball to rise toward the top of the chamber. Hold your  breath for 3-5 seconds, or for as long as possible. If the spirometer includes a coach indicator, use this to guide you in breathing. Slow down your breathing if the indicator goes above the marked areas. Remove the mouthpiece from your mouth and breathe out normally. The piston or ball will return to the bottom of the chamber. Rest for a few seconds, then repeat the steps 10 or more times. Take your time and take a few normal breaths between deep breaths so that you do not get dizzy or light-headed. Do this every 1-2 hours when you are awake. If the spirometer includes a goal marker to show the highest number you have reached (best effort), use this as a goal to work toward during each repetition. After each set of 10 deep breaths, cough a few times. This will help to make sure that your lungs are clear. If you have an incision on your chest or abdomen from surgery, place a pillow or a rolled-up towel firmly against the incision when you cough. This can help to reduce pain while taking deep breaths and coughing. General tips When you are able to get out of bed: Walk around often. Continue to take deep breaths and cough in order to clear your lungs. Keep using the incentive spirometer until your health care provider says it is okay to stop using it. If you have been in the hospital, you may be told to keep using the spirometer at home. Contact a health care provider if: You are having difficulty using the spirometer. You have trouble using the spirometer as often as instructed. Your pain medicine is not giving enough relief for you to use the spirometer as told. You have a fever. Get help right away if: You develop shortness of breath. You develop a cough with bloody mucus from the lungs. You have fluid or blood coming from an incision site after you cough. Summary An incentive spirometer is a tool that can help you  learn to take long, deep breaths to keep your lungs clear and active. You  may be asked to use a spirometer after a surgery, if you have a lung problem or a history of smoking, or if you have been inactive for a long period of time. Use your incentive spirometer as instructed every 1-2 hours while you are awake. If you have an incision on your chest or abdomen, place a pillow or a rolled-up towel firmly against your incision when you cough. This will help to reduce pain. Get help right away if you have shortness of breath, you cough up bloody mucus, or blood comes from your incision when you cough. This information is not intended to replace advice given to you by your health care provider. Make sure you discuss any questions you have with your health care provider. Document Revised: 10/24/2019 Document Reviewed: 10/24/2019 Elsevier Patient Education  2023 [employer].

## 2024-07-27 ENCOUNTER — Inpatient Hospital Stay: Admission: RE | Admit: 2024-07-27 | Discharge: 2024-07-27 | Attending: Urology

## 2024-07-27 ENCOUNTER — Ambulatory Visit: Payer: Managed Care, Other (non HMO) | Admitting: Dermatology

## 2024-07-27 DIAGNOSIS — Z01818 Encounter for other preprocedural examination: Secondary | ICD-10-CM | POA: Insufficient documentation

## 2024-07-27 DIAGNOSIS — D1771 Benign lipomatous neoplasm of kidney: Secondary | ICD-10-CM | POA: Insufficient documentation

## 2024-07-27 LAB — BASIC METABOLIC PANEL WITH GFR
Anion gap: 11 (ref 5–15)
BUN: 19 mg/dL (ref 8–23)
CO2: 26 mmol/L (ref 22–32)
Calcium: 9.1 mg/dL (ref 8.9–10.3)
Chloride: 104 mmol/L (ref 98–111)
Creatinine, Ser: 1.02 mg/dL — ABNORMAL HIGH (ref 0.44–1.00)
GFR, Estimated: 57 mL/min — ABNORMAL LOW (ref >60)
Glucose, Bld: 81 mg/dL (ref 70–99)
Potassium: 4.3 mmol/L (ref 3.5–5.1)
Sodium: 141 mmol/L (ref 135–145)

## 2024-07-27 LAB — URINALYSIS, COMPLETE (UACMP) WITH MICROSCOPIC
Bacteria, UA: NONE SEEN
Bilirubin Urine: NEGATIVE
Glucose, UA: NEGATIVE mg/dL
Hgb urine dipstick: NEGATIVE
Ketones, ur: NEGATIVE mg/dL
Nitrite: NEGATIVE
Protein, ur: NEGATIVE mg/dL
Specific Gravity, Urine: 1.018 (ref 1.005–1.030)
pH: 5 (ref 5.0–8.0)

## 2024-07-27 LAB — CBC
HCT: 41.9 % (ref 36.0–46.0)
Hemoglobin: 13.7 g/dL (ref 12.0–15.0)
MCH: 27.8 pg (ref 26.0–34.0)
MCHC: 32.7 g/dL (ref 30.0–36.0)
MCV: 85.2 fL (ref 80.0–100.0)
Platelets: 290 K/uL (ref 150–400)
RBC: 4.92 MIL/uL (ref 3.87–5.11)
RDW: 12.8 % (ref 11.5–15.5)
WBC: 5.7 K/uL (ref 4.0–10.5)
nRBC: 0 % (ref 0.0–0.2)

## 2024-07-27 LAB — APTT: aPTT: 33 s (ref 24–36)

## 2024-07-27 LAB — PROTIME-INR
INR: 0.9 (ref 0.8–1.2)
Prothrombin Time: 12.9 s (ref 11.4–15.2)

## 2024-07-27 LAB — TYPE AND SCREEN
ABO/RH(D): O POS
Antibody Screen: NEGATIVE

## 2024-07-28 LAB — URINE CULTURE: Culture: NO GROWTH

## 2024-08-01 NOTE — Op Note (Incomplete)
 Date of procedure: 08/01/2024  Preoperative diagnosis:  Right renal AML  Postoperative diagnosis:  Right renal AML  Procedure: Right robotic radical nephrectomy   Surgeon: Penne Skye, MD  Assistant: Redell Burnet, mD - required due to surgical case complexity, large tumor resection, required for bedside assisting, tissue manipulation, instrumentation and critical robotic handling   Anesthesia: General  Complications: None  Intraoperative findings:  Successful right robotic simple nephrectomy  EBL: <50cc  Specimens:  Right kidney and proximal ureter Right adrenal gland spared  Drains: None  Indication: [name] is a 73 y.o. patient with:  8.2 cm Right anterior/interpolar AML    - s/p IR embo in Jan 2025, previously 9.5 cm  After reviewing the management options for treatment, they elected to proceed with the above surgical procedure(s). We have discussed the potential benefits and risks of the procedure, side effects of the proposed treatment, the likelihood of the patient achieving the goals of the procedure, and any potential problems that might occur during the procedure or recuperation. Informed consent has been obtained.  Description of procedure:  Following general anesthetic induction, a foley catheter was placed under sterile technique. The patient was placed in a modified Right flank lateral position with all pressure points securely padded. She was prepped and draped in usual standard fashion and a 3 point identifying pre-procedure timeout was performed. We gained pneumoperitoneum via Veress needle entry along the mid clavicular line at the level of the 11th rib. With successful insufflation we removed our needle and introduced an 8mm robotic trocar. The abdomen was inspected with the robotic camera and found to be free of injury.  There were fairly moderate adhesions along the right mesocolon to the sidewall which were taken down carefully and sharply. The  remainder of our robotic ports were placed in a soft-J type configuration from the RUQ towards the right ASIS. A 12 mm assistant port was placed supraumbically, a 12 mm robotic port and a 5 mm assistant port in the upper midline to aid in liver retraction. Next, the robot was docked.   We began by reflecting the right lateral mesocolon and entering into the retroperitoneal space.  The duodenum was carefully kocherized.  Notably, the right large AML was evident and pushing the kidney medially. next we identified the right gonadal vein and the ureter inferiorly.  The ureter was elevated and the gonadal vein left down, and a space was entered just superior to the psoas belly to elevate the right kidney.  We continued our dissection cephalad along the gonadal vein, until its confluence with the vena cava and ultimately the right renal hilum.  The renal hilum was circumferentially dissected with the renal artery fully visualized.  The renal artery was stapled first with a single 45 mm vascular load with a robotic stapler.  A second renal artery was also stable with a 45 mm vascular load.  Hemostasis was excellent.  This was followed by a second 45 mm vascular load for the right renal vein.  We continued our dissection cephalad.  The right adrenal gland was visualized and spared.  We released superior attachments along the hepatorenal ligament and finished out laterally around the kidney.  Finally the ureter was clipped and divided, along with the tail of Gerota's, thus freeing the kidney.  The kidney was placed into an Endo Catch bag.  The right renal fossa was irrigated and inspected for hemostasis.  Surgiflo and Surgicel were placed along the hilum and adrenal fat.  At this point, the robot was undocked. We extracted our specimen through our assistant port incision.  Two 1 PDS was used to close the extraction midline fascia followed by 3-0 Vicryl for deep dermals. specimen was sent for permanent pathology. Fascia  was closed with a 0-vicryl on a UR6 in a figure of 8 fashion at the 12 mm robotic port site. All port sites were closed with 4-0 monocryl followed by dermabond. Patient was extubated without complication. This concluded the procedure.   Disposition: Stable to PACU  Plan: - Admit to MedSurg for overnight observation - Expect discharge tomorrow  Penne Skye, MD

## 2024-08-02 ENCOUNTER — Inpatient Hospital Stay: Admitting: Urgent Care

## 2024-08-02 ENCOUNTER — Encounter: Payer: Self-pay | Admitting: Urology

## 2024-08-02 ENCOUNTER — Inpatient Hospital Stay
Admission: RE | Admit: 2024-08-02 | Discharge: 2024-08-03 | DRG: 658 | Disposition: A | Source: Ambulatory Visit | Attending: Urology | Admitting: Urology

## 2024-08-02 ENCOUNTER — Encounter: Admission: RE | Disposition: A | Payer: Self-pay | Source: Ambulatory Visit | Attending: Urology

## 2024-08-02 ENCOUNTER — Encounter: Admitting: Urgent Care

## 2024-08-02 ENCOUNTER — Other Ambulatory Visit: Payer: Self-pay

## 2024-08-02 DIAGNOSIS — Z825 Family history of asthma and other chronic lower respiratory diseases: Secondary | ICD-10-CM | POA: Diagnosis not present

## 2024-08-02 DIAGNOSIS — D1771 Benign lipomatous neoplasm of kidney: Secondary | ICD-10-CM | POA: Diagnosis present

## 2024-08-02 DIAGNOSIS — N2889 Other specified disorders of kidney and ureter: Secondary | ICD-10-CM | POA: Diagnosis present

## 2024-08-02 DIAGNOSIS — Z8249 Family history of ischemic heart disease and other diseases of the circulatory system: Secondary | ICD-10-CM | POA: Diagnosis not present

## 2024-08-02 DIAGNOSIS — Z8049 Family history of malignant neoplasm of other genital organs: Secondary | ICD-10-CM | POA: Diagnosis not present

## 2024-08-02 DIAGNOSIS — I10 Essential (primary) hypertension: Secondary | ICD-10-CM | POA: Diagnosis present

## 2024-08-02 DIAGNOSIS — Z01810 Encounter for preprocedural cardiovascular examination: Secondary | ICD-10-CM

## 2024-08-02 DIAGNOSIS — Z90710 Acquired absence of both cervix and uterus: Secondary | ICD-10-CM | POA: Diagnosis not present

## 2024-08-02 DIAGNOSIS — Z9049 Acquired absence of other specified parts of digestive tract: Secondary | ICD-10-CM | POA: Diagnosis not present

## 2024-08-02 DIAGNOSIS — F419 Anxiety disorder, unspecified: Secondary | ICD-10-CM | POA: Diagnosis present

## 2024-08-02 DIAGNOSIS — K219 Gastro-esophageal reflux disease without esophagitis: Secondary | ICD-10-CM | POA: Diagnosis present

## 2024-08-02 DIAGNOSIS — E782 Mixed hyperlipidemia: Secondary | ICD-10-CM | POA: Diagnosis present

## 2024-08-02 HISTORY — PX: ROBOT ASSISTED LAPAROSCOPIC NEPHRECTOMY: SHX5140

## 2024-08-02 LAB — ABO/RH: ABO/RH(D): O POS

## 2024-08-02 SURGERY — NEPHRECTOMY, RADICAL, ROBOT-ASSISTED, LAPAROSCOPIC, ADULT
Anesthesia: General | Site: Flank | Laterality: Right

## 2024-08-02 MED ORDER — ONDANSETRON HCL 4 MG/2ML IJ SOLN
INTRAMUSCULAR | Status: DC | PRN
  Administered 2024-08-02: 13:00:00 4 mg via INTRAVENOUS

## 2024-08-02 MED ORDER — CHLORHEXIDINE GLUCONATE 4 % EX SOLN: 60.0000 mL | Freq: Once | CUTANEOUS | Status: DC

## 2024-08-02 MED ORDER — ACETAMINOPHEN 10 MG/ML IV SOLN
INTRAVENOUS | Status: AC
  Filled 2024-08-02: qty 100

## 2024-08-02 MED ORDER — BUPIVACAINE LIPOSOME 1.3 % IJ SUSP
INTRAMUSCULAR | Status: AC
  Filled 2024-08-02: qty 20

## 2024-08-02 MED ORDER — CEFAZOLIN SODIUM-DEXTROSE 2-4 GM/100ML-% IV SOLN
2.0000 g | INTRAVENOUS | Status: AC
  Administered 2024-08-02: 11:00:00 2 g via INTRAVENOUS

## 2024-08-02 MED ORDER — ACETAMINOPHEN 500 MG PO TABS
1000.0000 mg | ORAL_TABLET | Freq: Four times a day (QID) | ORAL | Status: AC
  Administered 2024-08-02 – 2024-08-03 (×4): 1000 mg via ORAL
  Filled 2024-08-02 (×4): qty 2

## 2024-08-02 MED ORDER — ONDANSETRON HCL 4 MG/2ML IJ SOLN: 4.0000 mg | INTRAMUSCULAR | Status: DC | PRN

## 2024-08-02 MED ORDER — BUPIVACAINE LIPOSOME 1.3 % IJ SUSP
INTRAMUSCULAR | Status: DC | PRN
  Administered 2024-08-02: 13:00:00 50 mL via INTRAMUSCULAR

## 2024-08-02 MED ORDER — PROPOFOL 10 MG/ML IV BOLUS
INTRAVENOUS | Status: DC | PRN
  Administered 2024-08-02: 11:00:00 150 mg via INTRAVENOUS

## 2024-08-02 MED ORDER — ORAL CARE MOUTH RINSE: 15.0000 mL | Freq: Once | OROMUCOSAL | Status: AC

## 2024-08-02 MED ORDER — ONDANSETRON HCL 4 MG/2ML IJ SOLN: 4.0000 mg | Freq: Once | INTRAMUSCULAR | Status: DC | PRN

## 2024-08-02 MED ORDER — MIDAZOLAM HCL 2 MG/2ML IJ SOLN
INTRAMUSCULAR | Status: AC
  Filled 2024-08-02: qty 2

## 2024-08-02 MED ORDER — STERILE WATER FOR IRRIGATION IR SOLN
Status: DC | PRN
  Administered 2024-08-02: 12:00:00 500 mL

## 2024-08-02 MED ORDER — CEFAZOLIN SODIUM-DEXTROSE 2-4 GM/100ML-% IV SOLN
INTRAVENOUS | Status: AC
  Filled 2024-08-02: qty 100

## 2024-08-02 MED ORDER — FENTANYL CITRATE (PF) 100 MCG/2ML IJ SOLN
INTRAMUSCULAR | Status: DC | PRN
  Administered 2024-08-02 (×2): 50 ug via INTRAVENOUS

## 2024-08-02 MED ORDER — LIDOCAINE HCL (PF) 2 % IJ SOLN
INTRAMUSCULAR | Status: AC
  Filled 2024-08-02: qty 5

## 2024-08-02 MED ORDER — SUGAMMADEX SODIUM 200 MG/2ML IV SOLN
INTRAVENOUS | Status: DC | PRN
  Administered 2024-08-02: 13:00:00 200 mg via INTRAVENOUS

## 2024-08-02 MED ORDER — FENTANYL CITRATE (PF) 100 MCG/2ML IJ SOLN
INTRAMUSCULAR | Status: AC
  Filled 2024-08-02: qty 2

## 2024-08-02 MED ORDER — OXYCODONE HCL 5 MG PO TABS
5.0000 mg | ORAL_TABLET | ORAL | Status: DC | PRN
  Administered 2024-08-02 – 2024-08-03 (×2): 5 mg via ORAL
  Filled 2024-08-02: qty 1

## 2024-08-02 MED ORDER — OXYBUTYNIN CHLORIDE 5 MG PO TABS: 5.0000 mg | ORAL_TABLET | Freq: Three times a day (TID) | ORAL | Status: DC | PRN

## 2024-08-02 MED ORDER — LACTATED RINGERS IV SOLN: INTRAVENOUS | Status: DC

## 2024-08-02 MED ORDER — DEXTROSE-SODIUM CHLORIDE 5-0.45 % IV SOLN: INTRAVENOUS | Status: DC

## 2024-08-02 MED ORDER — ONDANSETRON HCL 4 MG/2ML IJ SOLN
INTRAMUSCULAR | Status: AC
  Filled 2024-08-02: qty 2

## 2024-08-02 MED ORDER — ROCURONIUM BROMIDE 10 MG/ML (PF) SYRINGE
PREFILLED_SYRINGE | INTRAVENOUS | Status: AC
  Filled 2024-08-02: qty 10

## 2024-08-02 MED ORDER — DEXAMETHASONE SOD PHOSPHATE PF 10 MG/ML IJ SOLN
INTRAMUSCULAR | Status: DC | PRN
  Administered 2024-08-02: 11:00:00 10 mg via INTRAVENOUS

## 2024-08-02 MED ORDER — ROCURONIUM BROMIDE 100 MG/10ML IV SOLN
INTRAVENOUS | Status: DC | PRN
  Administered 2024-08-02: 12:00:00 20 mg via INTRAVENOUS
  Administered 2024-08-02: 11:00:00 50 mg via INTRAVENOUS

## 2024-08-02 MED ORDER — PROPOFOL 10 MG/ML IV BOLUS
INTRAVENOUS | Status: AC
  Filled 2024-08-02: qty 20

## 2024-08-02 MED ORDER — LACTATED RINGERS IV SOLN: INTRAVENOUS | Status: DC | PRN

## 2024-08-02 MED ORDER — CHLORHEXIDINE GLUCONATE 0.12 % MT SOLN
15.0000 mL | Freq: Once | OROMUCOSAL | Status: AC
  Administered 2024-08-02: 10:00:00 15 mL via OROMUCOSAL

## 2024-08-02 MED ORDER — OXYCODONE HCL 5 MG PO TABS
ORAL_TABLET | ORAL | Status: AC
  Filled 2024-08-02: qty 1

## 2024-08-02 MED ORDER — LIDOCAINE HCL (CARDIAC) PF 100 MG/5ML IV SOSY
PREFILLED_SYRINGE | INTRAVENOUS | Status: DC | PRN
  Administered 2024-08-02: 11:00:00 60 mg via INTRAVENOUS

## 2024-08-02 MED ORDER — SODIUM CHLORIDE 0.9 % IV SOLN: INTRAVENOUS | Status: DC | PRN

## 2024-08-02 MED ORDER — OXYCODONE HCL 5 MG PO TABS
5.0000 mg | ORAL_TABLET | ORAL | Status: DC | PRN
  Administered 2024-08-02 (×2): 5 mg via ORAL
  Filled 2024-08-02: qty 1
  Filled 2024-08-02: qty 2

## 2024-08-02 MED ORDER — SENNOSIDES-DOCUSATE SODIUM 8.6-50 MG PO TABS
2.0000 | ORAL_TABLET | Freq: Every day | ORAL | Status: DC
  Administered 2024-08-02: 22:00:00 2 via ORAL
  Filled 2024-08-02: qty 2

## 2024-08-02 MED ORDER — SURGIFLO WITH THROMBIN (HEMOSTATIC MATRIX KIT) OPTIME
TOPICAL | Status: DC | PRN
  Administered 2024-08-02: 12:00:00 1 via TOPICAL

## 2024-08-02 MED ORDER — FENTANYL CITRATE (PF) 100 MCG/2ML IJ SOLN
25.0000 ug | INTRAMUSCULAR | Status: DC | PRN
  Administered 2024-08-02 (×4): 25 ug via INTRAVENOUS

## 2024-08-02 MED ORDER — BUPIVACAINE HCL (PF) 0.5 % IJ SOLN
INTRAMUSCULAR | Status: AC
  Filled 2024-08-02: qty 30

## 2024-08-02 MED ORDER — ACETAMINOPHEN 10 MG/ML IV SOLN
INTRAVENOUS | Status: DC | PRN
  Administered 2024-08-02: 13:00:00 1000 mg via INTRAVENOUS

## 2024-08-02 MED ORDER — CHLORHEXIDINE GLUCONATE 0.12 % MT SOLN
OROMUCOSAL | Status: AC
  Filled 2024-08-02: qty 15

## 2024-08-02 MED ORDER — HEMOSTATIC AGENTS (NO CHARGE) OPTIME
TOPICAL | Status: DC | PRN
  Administered 2024-08-02: 12:00:00 1 via TOPICAL

## 2024-08-02 MED ORDER — MIDAZOLAM HCL (PF) 2 MG/2ML IJ SOLN
INTRAMUSCULAR | Status: DC | PRN
  Administered 2024-08-02: 11:00:00 2 mg via INTRAVENOUS

## 2024-08-02 SURGICAL SUPPLY — 56 items
APPLICATOR ARISTA FLEXITIP XL (MISCELLANEOUS) ×1 IMPLANT
APPLICATOR SURGIFLO ENDO (HEMOSTASIS) ×1 IMPLANT
BAG LAPAROSCOPIC 12 15 PORT 16 (BASKET) ×1 IMPLANT
CHLORAPREP W/TINT 26 (MISCELLANEOUS) ×2 IMPLANT
CLIP APPLIE 5 13 M/L LIGAMAX5 (MISCELLANEOUS) IMPLANT
CLIP LIGATING HEM O LOK PURPLE (MISCELLANEOUS) ×4 IMPLANT
COVER TIP SHEARS 8 DVNC (MISCELLANEOUS) ×1 IMPLANT
DEFOGGER SCOPE WARM SEASHARP (MISCELLANEOUS) ×2 IMPLANT
DERMABOND ADVANCED .7 DNX12 (GAUZE/BANDAGES/DRESSINGS) ×2 IMPLANT
DRAPE ARM DVNC X/XI (DISPOSABLE) ×4 IMPLANT
DRAPE COLUMN DVNC XI (DISPOSABLE) ×1 IMPLANT
DRAPE INCISE IOBAN 66X60 STRL (DRAPES) ×1 IMPLANT
DRAPE SHEET LG 3/4 BI-LAMINATE (DRAPES) IMPLANT
ELECTRODE REM PT RTRN 9FT ADLT (ELECTROSURGICAL) ×1 IMPLANT
FORCEPS BPLR 8 MD DVNC XI (FORCEP) ×1 IMPLANT
FORCEPS BPLR FENES DVNC XI (FORCEP) ×1 IMPLANT
FORCEPS PROGRASP DVNC XI (FORCEP) ×1 IMPLANT
GLOVE BIOGEL PI IND STRL 7.0 (GLOVE) ×2 IMPLANT
GLOVE BIOGEL PI IND STRL 7.5 (GLOVE) ×1 IMPLANT
GOWN STRL REUS W/ TWL LRG LVL3 (GOWN DISPOSABLE) ×4 IMPLANT
GOWN STRL REUS W/ TWL XL LVL3 (GOWN DISPOSABLE) ×1 IMPLANT
GRASPER TIP-UP FEN DVNC XI (INSTRUMENTS) IMPLANT
HEMOSTAT ARISTA ABSORB 1G (HEMOSTASIS) ×1 IMPLANT
HEMOSTAT SURGICEL 2X14 (HEMOSTASIS) IMPLANT
IRRIGATION STRYKERFLOW (MISCELLANEOUS) ×1 IMPLANT
KIT PINK PAD W/HEAD ARM REST (MISCELLANEOUS) ×1 IMPLANT
LOOP VESSEL MAXI 1X406 RED (MISCELLANEOUS) IMPLANT
MANIFOLD NEPTUNE II (INSTRUMENTS) ×1 IMPLANT
NDL HYPO 22X1.5 SAFETY MO (MISCELLANEOUS) ×1 IMPLANT
NDL INSUFFLATION 14GA 120MM (NEEDLE) ×1 IMPLANT
OBTURATOR OPTICALSTD 8 DVNC (TROCAR) ×1 IMPLANT
PACK LAP CHOLECYSTECTOMY (MISCELLANEOUS) ×1 IMPLANT
PENCIL SMOKE EVACUATOR (MISCELLANEOUS) IMPLANT
PORT ACCESS TROCAR AIRSEAL 12 (TROCAR) ×1 IMPLANT
RELOAD STAPLE 45 2.5 WHT DVNC (STAPLE) IMPLANT
SCISSORS MNPLR CVD DVNC XI (INSTRUMENTS) ×1 IMPLANT
SEAL UNIV 5-12 XI (MISCELLANEOUS) ×4 IMPLANT
SET TRI-LUMEN FLTR TB AIRSEAL (TUBING) ×1 IMPLANT
SLEEVE Z-THREAD 5X100MM (TROCAR) IMPLANT
SOL .9 NS 3000ML IRR UROMATIC (IV SOLUTION) ×1 IMPLANT
SOLN STERILE WATER 500 ML (IV SOLUTION) ×1 IMPLANT
SOLUTION ELECTROSURG ANTI STCK (MISCELLANEOUS) ×1 IMPLANT
SPONGE T-LAP 18X18 ~~LOC~~+RFID (SPONGE) ×1 IMPLANT
STAPLER 45 SUREFORM DVNC (STAPLE) IMPLANT
STRAP SAFETY 5IN WIDE (MISCELLANEOUS) ×2 IMPLANT
SURGIFLO W/THROMBIN 8M KIT (HEMOSTASIS) ×1 IMPLANT
SUT MNCRL AB 4-0 PS2 18 (SUTURE) ×2 IMPLANT
SUT PDS #1 CTX NDL (SUTURE) ×1 IMPLANT
SUT STRATA 3-0 15 RB-1.5 (SUTURE) IMPLANT
SUT VIC AB 0 CT1 36 (SUTURE) ×1 IMPLANT
SUT VICRYL 0 UR6 27IN ABS (SUTURE) IMPLANT
TRAP FLUID SMOKE EVACUATOR (MISCELLANEOUS) ×1 IMPLANT
TRAY FOLEY SLVR 16FR LF STAT (SET/KITS/TRAYS/PACK) ×1 IMPLANT
TROCAR Z THRD FIOS 12X150 (TROCAR) IMPLANT
TROCAR Z-THREAD FIOS 12X100MM (TROCAR) IMPLANT
TROCAR Z-THREAD FIOS 5X100MM (TROCAR) IMPLANT

## 2024-08-02 NOTE — H&P (Signed)
 HPI:  [name] is a 73 y.o. 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  History of prior lower abdominal liposuction No cardiovascular issues, no strokes heart attacks, no blood thinners      Prior HPI: On a lumbar spine MRI performed 06/2023, she was incidentally noted to have a lipomatous lesion of the lower pole of the right kidney. Renal mass protocol CT subsequently performed at [facility], which showed a 9.4 by 7.2 by 7.2 cm fat-containing renal mass consistent with angiomyolipoma.  No gross hematuria.She does have chronic bac     PMH: Past Medical History:  Diagnosis Date   Actinic keratosis    Allergy    Anxiety    Breast mass 10/07/2016   left 2:00   Depression    GERD (gastroesophageal reflux disease)    Hypertension    Osteopenia     Surgical History: Past Surgical History:  Procedure Laterality Date   ABDOMINAL HYSTERECTOMY  1988   complete; due to fibroids   BLADDER REPAIR  2007   BREAST BIOPSY Left 11/25/2022   US  Axilla Bx,   CHOLECYSTECTOMY  2003   COLONOSCOPY  04/16/2009   IR EMBO TUMOR ORGAN ISCHEMIA INFARCT INC GUIDE ROADMAPPING  09/14/2023   IR RADIOLOGIST EVAL & MGMT  09/01/2023   IR RADIOLOGIST EVAL & MGMT  09/24/2023   KNEE SURGERY Left 2012   TONSILLECTOMY  1960   tummy tuck      Home Medications:   Allergies: Allergies[1]  Family History: Family History  Problem Relation Age of Onset   Cervical cancer Mother    COPD Mother    COPD Father    Hypertension Father    Colon cancer Neg Hx    Colon polyps Neg Hx    Esophageal  cancer Neg Hx    Rectal cancer Neg Hx    Stomach cancer Neg Hx    Breast cancer Neg Hx     Social History:  reports that she has never smoked. She has never used smokeless tobacco. She reports that she does not drink alcohol  and does not use drugs.  ROS: Negative aside from those stated in the HPI.  Physical Exam: BP 137/65   Pulse 62   Temp (!) 97 F (36.1 C)   Resp 14   Ht 5' 2 (1.575 m)   Wt 70.3 kg   SpO2 99%   BMI 28.35 kg/m    General: no acute distress, alert/oriented, conversational  HEENT: equal nondilated pupils CV: regular rate Lung: unlabored breathing, regular rate and rhythm  Abd: nondistended, nontender with palpation, no palpable masses  MSK: moving all extremities without issue, normal observed motor function   Laboratory Data:  Latest Reference Range & Units 07/27/24 10:57  Creatinine 0.44 - 1.00 mg/dL 8.97 (H)    Latest Reference Range & Units 07/27/24 10:57  Appearance CLEAR  CLEAR !  Bilirubin Urine NEGATIVE  NEGATIVE  Color, Urine YELLOW  YELLOW !  Glucose, UA NEGATIVE mg/dL NEGATIVE  Hgb urine dipstick NEGATIVE  NEGATIVE  Ketones, ur NEGATIVE mg/dL NEGATIVE  Leukocytes,Ua  NEGATIVE  TRACE !  Nitrite NEGATIVE  NEGATIVE  pH 5.0 - 8.0  5.0  Protein NEGATIVE mg/dL NEGATIVE  Specific Gravity, Urine 1.005 - 1.030  1.018  Bacteria, UA NONE SEEN  NONE SEEN  Mucus  PRESENT  RBC / HPF 0 - 5 RBC/hpf 0-5  Squamous Epithelial / HPF 0 - 5 /HPF 0-5  WBC, UA 0 - 5 WBC/hpf 0-5  !: Data is abnormal      Assessment & Plan:   8.2 cm Right anterior/interpolar AML  - s/p IR embo in Jan 2025, previously 9.5 cm  - Proceed to OR for Right robotic simple nephrectomy. Site marked this AM, consent signed  Stacey JONELLE Skye, MD  [facility] [address], [address] [place], [place] [address] [phone]      [1] No Known Allergies

## 2024-08-02 NOTE — Anesthesia Procedure Notes (Signed)
 Procedure Name: Intubation Date/Time: 08/02/2024 10:59 AM  Performed by: Niki Manus SAUNDERS, CRNAPre-anesthesia Checklist: Patient identified, Patient being monitored, Timeout performed, Emergency Drugs available and Suction available Patient Re-evaluated:Patient Re-evaluated prior to induction Oxygen Delivery Method: Circle system utilized Preoxygenation: Pre-oxygenation with 100% oxygen Induction Type: IV induction Ventilation: Mask ventilation without difficulty Laryngoscope Size: Mac and 3 Grade View: Grade I Tube type: Oral Tube size: 7.0 mm Number of attempts: 1 Airway Equipment and Method: Stylet Placement Confirmation: ETT inserted through vocal cords under direct vision, positive ETCO2 and breath sounds checked- equal and bilateral Secured at: 21 cm Tube secured with: Tape Dental Injury: Teeth and Oropharynx as per pre-operative assessment

## 2024-08-02 NOTE — Anesthesia Preprocedure Evaluation (Signed)
 Anesthesia Evaluation  Patient identified by MRN, date of birth, ID band Patient awake    Reviewed: Allergy & Precautions, NPO status , Patient's Chart, lab work & pertinent test results  Airway Mallampati: III  TM Distance: >3 FB Neck ROM: full    Dental  (+) Teeth Intact   Pulmonary neg pulmonary ROS   Pulmonary exam normal        Cardiovascular Exercise Tolerance: Good hypertension, Pt. on medications negative cardio ROS Normal cardiovascular exam Rhythm:Regular Rate:Normal     Neuro/Psych   Anxiety     negative neurological ROS  negative psych ROS   GI/Hepatic negative GI ROS, Neg liver ROS,GERD  Medicated,,  Endo/Other  negative endocrine ROS    Renal/GU   negative genitourinary   Musculoskeletal negative musculoskeletal ROS (+)    Abdominal Normal abdominal exam  (+)   Peds negative pediatric ROS (+)  Hematology negative hematology ROS (+)   Anesthesia Other Findings Past Medical History: No date: Actinic keratosis No date: Allergy No date: Anxiety 10/07/2016: Breast mass     Comment:  left 2:00 No date: Depression No date: GERD (gastroesophageal reflux disease) No date: Hypertension No date: Osteopenia  Past Surgical History: 1988: ABDOMINAL HYSTERECTOMY     Comment:  complete; due to fibroids 2007: BLADDER REPAIR 11/25/2022: BREAST BIOPSY; Left     Comment:  US  Axilla Bx, 2003: CHOLECYSTECTOMY 04/16/2009: COLONOSCOPY 09/14/2023: IR EMBO TUMOR ORGAN ISCHEMIA INFARCT INC GUIDE ROADMAPPING 09/01/2023: IR RADIOLOGIST EVAL & MGMT 09/24/2023: IR RADIOLOGIST EVAL & MGMT 2012: KNEE SURGERY; Left 1960: TONSILLECTOMY No date: tummy tuck  BMI    Body Mass Index: 28.35 kg/m      Reproductive/Obstetrics negative OB ROS                              Anesthesia Physical Anesthesia Plan  ASA: 2  Anesthesia Plan: General   Post-op Pain Management:    Induction:  Intravenous  PONV Risk Score and Plan: 1 and Ondansetron  and Dexamethasone   Airway Management Planned: Oral ETT  Additional Equipment:   Intra-op Plan:   Post-operative Plan: Extubation in OR  Informed Consent: I have reviewed the patients History and Physical, chart, labs and discussed the procedure including the risks, benefits and alternatives for the proposed anesthesia with the patient or authorized representative who has indicated his/her understanding and acceptance.     Dental Advisory Given  Plan Discussed with: CRNA  Anesthesia Plan Comments:         Anesthesia Quick Evaluation

## 2024-08-02 NOTE — Transfer of Care (Signed)
 Immediate Anesthesia Transfer of Care Note  Patient: [name]  Procedure(s) Performed: NEPHRECTOMY, RADICAL, ROBOT-ASSISTED, LAPAROSCOPIC, ADULT (Right: Flank)  Patient Location: PACU  Anesthesia Type:General  Level of Consciousness: awake and alert   Airway & Oxygen Therapy: Patient Spontanous Breathing and Patient connected to nasal cannula oxygen  Post-op Assessment: Report given to RN and Post -op Vital signs reviewed and stable  Post vital signs: Reviewed and stable  Last Vitals:  Vitals Value Taken Time  BP    Temp    Pulse 54 08/02/24 13:15  Resp 16 08/02/24 13:16  SpO2 100 % 08/02/24 13:15  Vitals shown include unfiled device data.  Last Pain:  Vitals:   08/02/24 0934  PainSc: 0-No pain         Complications: No notable events documented.

## 2024-08-02 NOTE — Plan of Care (Signed)
  Problem: Pain Managment: Goal: General experience of comfort will improve and/or be controlled Outcome: Progressing   Problem: Safety: Goal: Ability to remain free from injury will improve Outcome: Progressing

## 2024-08-02 NOTE — Anesthesia Postprocedure Evaluation (Signed)
 Anesthesia Post Note  Patient: [name]  Procedure(s) Performed: NEPHRECTOMY, RADICAL, ROBOT-ASSISTED, LAPAROSCOPIC, ADULT (Right: Flank)  Patient location during evaluation: PACU Anesthesia Type: General Level of consciousness: awake Pain management: satisfactory to patient Vital Signs Assessment: post-procedure vital signs reviewed and stable Respiratory status: nonlabored ventilation Cardiovascular status: stable Anesthetic complications: no   No notable events documented.   Last Vitals:  Vitals:   08/02/24 1315 08/02/24 1320  BP: 130/62   Pulse: (!) 54 (!) 53  Resp: 15 15  Temp: (!) 35.9 C   SpO2: 100% 100%    Last Pain:  Vitals:   08/02/24 1339  PainSc: 6                  VAN STAVEREN,Vali Capano

## 2024-08-03 ENCOUNTER — Encounter: Payer: Self-pay | Admitting: Urology

## 2024-08-03 LAB — BASIC METABOLIC PANEL WITH GFR
Anion gap: 10 (ref 5–15)
BUN: 24 mg/dL — ABNORMAL HIGH (ref 8–23)
CO2: 23 mmol/L (ref 22–32)
Calcium: 9.1 mg/dL (ref 8.9–10.3)
Chloride: 103 mmol/L (ref 98–111)
Creatinine, Ser: 1.62 mg/dL — ABNORMAL HIGH (ref 0.44–1.00)
GFR, Estimated: 33 mL/min — ABNORMAL LOW (ref >60)
Glucose, Bld: 167 mg/dL — ABNORMAL HIGH (ref 70–99)
Potassium: 4.6 mmol/L (ref 3.5–5.1)
Sodium: 136 mmol/L (ref 135–145)

## 2024-08-03 LAB — CBC
HCT: 38.2 % (ref 36.0–46.0)
Hemoglobin: 12.4 g/dL (ref 12.0–15.0)
MCH: 27.7 pg (ref 26.0–34.0)
MCHC: 32.5 g/dL (ref 30.0–36.0)
MCV: 85.3 fL (ref 80.0–100.0)
Platelets: 245 K/uL (ref 150–400)
RBC: 4.48 MIL/uL (ref 3.87–5.11)
RDW: 12.8 % (ref 11.5–15.5)
WBC: 15.5 K/uL — ABNORMAL HIGH (ref 4.0–10.5)
nRBC: 0 % (ref 0.0–0.2)

## 2024-08-03 NOTE — Plan of Care (Signed)
   Problem: Education: Goal: Knowledge of General Education information will improve Description Including pain rating scale, medication(s)/side effects and non-pharmacologic comfort measures Outcome: Progressing

## 2024-08-03 NOTE — Progress Notes (Signed)
DISCHARGE NOTE:  Pt given discharge instructions and verbalized understanding. Pt wheeled to car by staff, husband providing transportation home.

## 2024-08-03 NOTE — Discharge Summary (Signed)
 Date of admission: 08/02/2024  Date of discharge: 08/03/2024  Admission diagnosis: Right renal AML  Discharge diagnosis: Same as above  Secondary diagnoses:  Patient Active Problem List   Diagnosis Date Noted   Renal mass, right 08/02/2024   Osteopenia 05/02/2024   Pulmonary nodules 05/02/2024   Mixed hyperlipidemia 05/02/2024   Renal angiomyolipoma 05/02/2024   Annual physical exam 05/30/2022   Screening mammogram for breast cancer 05/30/2022   Gastroesophageal reflux disease without esophagitis 07/18/2021   Hypertensive disorder 11/03/2018   Anxiety 01/23/2015   History and Physical: For full details, please see admission history and physical. Briefly, [name] is a 73 y.o. year old patient admitted on 08/02/2024 for scheduled right robotic radical nephrectomy with Dr. Georganne for management of a right renal AML.   Creatinine up today, 1.62, hemoglobin stable.  She is afebrile, VSS.  Foley catheter has been removed.  She ambulated overnight without difficulty.  As of today, she has voided and is tolerating p.o. without nausea or vomiting.  Pain remains well-controlled.  Physical Exam: Constitutional:  Alert and oriented, no acute distress, nontoxic appearing HEENT: [place], AT Cardiovascular: No clubbing, cyanosis, or edema Respiratory: Normal respiratory effort, no increased work of breathing GI: Abdomen is soft with appropriate postoperative tenderness, no rigidity or rebound. Incisions clean/dry/intact with overlying surgical adhesive. Skin: No rashes, bruises or suspicious lesions Neurologic: Grossly intact, no focal deficits, moving all 4 extremities Psychiatric: Normal mood and affect   Hospital Course: Patient tolerated the procedure well.  She was then transferred to the floor after an uneventful PACU stay.  Her hospital course was uncomplicated.  On POD#1 she had met discharge criteria: was eating a regular diet, was up and ambulating independently,  pain was well  controlled, was voiding without a catheter, and was ready for discharge.  Laboratory values:  Recent Labs    08/03/24 0703  WBC 15.5*  HGB 12.4  HCT 38.2   Recent Labs    08/03/24 0703  NA 136  K 4.6  CL 103  CO2 23  GLUCOSE 167*  BUN 24*  CREATININE 1.62*  CALCIUM 9.1   Results for orders placed or performed during the hospital encounter of 07/27/24  Urine Culture     Status: None   Collection Time: 07/27/24 10:57 AM   Specimen: Urine, Clean Catch  Result Value Ref Range Status   Specimen Description   Final    URINE, CLEAN CATCH Performed at [facility], [address], [place], [place] [address]    Special Requests   Final    NONE Performed at [facility], [address] [place], [place] [address]    Culture   Final    NO GROWTH Performed at [facility], [address], [place], [place] [address]    Report Status 07/28/2024 FINAL  Final   Disposition: Home  Discharge instruction: The patient was instructed to be ambulatory but told to refrain from heavy lifting, strenuous activity, or driving.  We discussed solitary kidney instructions including avoiding nephrotoxic agents including NSAIDs.  I put CKD diet information in her paperwork.  Discharge medications:  Allergies as of 08/03/2024   No Known Allergies      Medication List     TAKE these medications    amLODipine -benazepril  10-40 MG capsule Commonly known as: LOTREL Take 1 capsule by mouth daily. What changed: when to take this   [employer] CALCIUM CITRATE PO Take 1 tablet by mouth in the morning.  cyanocobalamin 1000 MCG tablet Commonly known as: VITAMIN B12 Take 1,000 mcg by mouth in the morning.   escitalopram  20 MG tablet Commonly known as: LEXAPRO  Take 1 tablet (20 mg total) by mouth at bedtime.   famotidine  40 MG tablet Commonly known as: PEPCID  Take 1 tablet (40 mg total) by mouth daily. What changed:  when to take this reasons to take this   FISH  OIL PO Take 1 capsule by mouth daily.   MAGNESIUM GLYCINATE PO Take 2 capsules by mouth in the morning.   oxyCODONE  5 MG immediate release tablet Commonly known as: Oxy IR/ROXICODONE  Take 1 tablet (5 mg total) by mouth every 6 (six) hours as needed for moderate pain (pain score 4-6) or severe pain (pain score 7-10).   Potassium 99 MG Tabs Take 99 mg by mouth in the morning.   SELENIUM PO Take 1 capsule by mouth in the morning.   TURMERIC PO Take 2,000 mg by mouth in the morning.   vitamin C 100 MG tablet Take by mouth daily.   Vitamin D3 50 MCG (2000 UT) Tabs Take 5,000 Units by mouth in the morning.   zinc sulfate (50mg  elemental zinc) 220 (50 Zn) MG capsule Take 220 mg by mouth in the morning.        Followup:   Follow-up Information     Georganne Penne SAUNDERS, MD Follow up on 08/25/2024.   Specialty: Urology Why: For postop follow-up Contact information: [address] [place], [address] [place] [address] [phone]

## 2024-08-04 ENCOUNTER — Telehealth: Payer: Self-pay

## 2024-08-04 LAB — SURGICAL PATHOLOGY

## 2024-08-04 NOTE — Transitions of Care (Post Inpatient/ED Visit) (Signed)
 08/04/2024  Name: [name] MRN: [id number] DOB: 01-Oct-1950  Today's TOC FU Call Status: Today's TOC FU Call Status:: Successful TOC FU Call Completed TOC FU Call Complete Date: 08/04/24  Patient's Name and Date of Birth confirmed. Name, DOB  Transition Care Management Follow-up Telephone Call Date of Discharge: 08/03/24 Discharge Facility: [facility]) Type of Discharge: Inpatient Admission Primary Inpatient Discharge Diagnosis:: Renal mass, right How have you been since you were released from the hospital?: Better  Items Reviewed: Did you receive and understand the discharge instructions provided?: Yes Medications obtained,verified, and reconciled?: Yes (Medications Reviewed) Any new allergies since your discharge?: No Dietary orders reviewed?: Yes Do you have support at home?: Yes People in Home [RPT]: spouse Name of Support/Comfort Primary Source: [place]  Medications Reviewed Today: Medications Reviewed Today     Reviewed by Eilleen Richerd GRADE, RN (Registered Nurse) on 08/04/24 at 1035  Med List Status: <None>   Medication Order Taking? Sig Documenting Provider Last Dose Status Informant  amLODipine -benazepril  (LOTREL) 10-40 MG capsule [id number]  Take 1 capsule by mouth daily.  Patient taking differently: Take 1 capsule by mouth at bedtime.   Franchot Isaiah LABOR, MD  Active Self  Ascorbic Acid (VITAMIN C) 100 MG tablet [id number] Yes Take by mouth daily. [provider]  Active Self  Cholecalciferol (VITAMIN D3) 2000 units TABS [id number]  Take 5,000 Units by mouth in the morning.  Patient not taking: Reported on 08/04/2024   [provider]  Active Self  [employer] CALCIUM CITRATE PO [id number]  Take 1 tablet by mouth in the morning. [provider]  Active Self  cyanocobalamin (VITAMIN B12) 1000 MCG tablet [id number]  Take 1,000 mcg by mouth in the morning. [provider]  Active Self  escitalopram  (LEXAPRO ) 20  MG tablet [id number] Yes Take 1 tablet (20 mg total) by mouth at bedtime. Franchot Isaiah LABOR, MD  Active Self  famotidine  (PEPCID ) 40 MG tablet [id number]  Take 1 tablet (40 mg total) by mouth daily.  Patient taking differently: Take 40 mg by mouth daily as needed for heartburn or indigestion.   Franchot Isaiah LABOR, MD  Active Self           Med Note LESLY, RICHERD GRADE Schaumann Aug 04, 2024 10:19 AM) Only as needed  MAGNESIUM GLYCINATE PO [id number]  Take 2 capsules by mouth in the morning. [provider]  Active Self  Omega-3 Fatty Acids (FISH OIL PO) [id number]  Take 1 capsule by mouth daily. [provider]  Active Self  oxyCODONE  (OXY IR/ROXICODONE ) 5 MG immediate release tablet [id number] Yes Take 1 tablet (5 mg total) by mouth every 6 (six) hours as needed for moderate pain (pain score 4-6) or severe pain (pain score 7-10). Vaillancourt, Samantha, PA-C  Active   Potassium 99 MG TABS [id number] Yes Take 99 mg by mouth in the morning. [provider]  Active Self  SELENIUM PO [id number]  Take 1 capsule by mouth in the morning. [provider]  Active Self  TURMERIC PO [id number]  Take 2,000 mg by mouth in the morning. [provider]  Active Self  zinc sulfate, 50mg  elemental zinc, 220 (50 Zn) MG capsule [id number]  Take 220 mg by mouth in the morning. [provider]  Active Self  Med List Note Lesly Richerd GRADE, [place] 08/04/24 1034): Patient states she will begin taking her vitamins and OTC once she feels her diet is good.  Home Care and Equipment/Supplies: Were Home Health Services Ordered?: No Any new equipment or medical supplies ordered?: No  Functional Questionnaire: Do you need assistance with bathing/showering or dressing?: No Do you need assistance with meal preparation?: No Do you need assistance with eating?: No Do you have difficulty maintaining continence: No Do you need assistance with getting out of bed/getting out of  a chair/moving?: No Do you have difficulty managing or taking your medications?: No  Follow up appointments reviewed: PCP Follow-up appointment confirmed?: No (Patient declines assistance to get it booked and states she will follow up if needed but only plans to follow up with the urologist) MD Provider Line [phone] Given: No Specialist Hospital Follow-up appointment confirmed?: Yes Date of Specialist follow-up appointment?: 08/25/24 Follow-Up Specialty Provider:: Georganne Penne SAUNDERS, MD in [facility] Do you need transportation to your follow-up appointment?: No Do you understand care options if your condition(s) worsen?: Yes-patient verbalized understanding  SDOH Interventions Today    Flowsheet Row Most Recent Value  SDOH Interventions   Food Insecurity Interventions Intervention Not Indicated  Housing Interventions Intervention Not Indicated  Transportation Interventions Intervention Not Indicated  Utilities Interventions Intervention Not Indicated    Education for self-mgmt of post of renal mass; post surgical care  provided during this outreach regarding: -s/s of worsening condition and when to seek medical attention -importance of completing all post discharge hospital hospital follow up appts -adherence to med regimen [facility] TOC 30-day program enrollment reviewed and discussed with pt/caregiver. They have declined enrollment in 30 day TOC program due to: feels like she has a good understanding of the post hospital process and care needs.   Richerd Fish, RN, BSN, CCM [facility], [facility] Management Coordinator Direct Dial: [phone]

## 2024-08-05 NOTE — Patient Instructions (Signed)
 Visit Information  Thank you for taking time to visit with me today. Please don't hesitate to contact me if I can be of assistance to you before our next scheduled telephone appointment.  Our next appointment is by telephone on August 09, 2024 at 10:00 AM  Following is a copy of your care plan:   Goals Addressed             This Visit's Progress    [facility] Transitions of Care (TOC) Care Plan       Problems:  Recent Hospitalization for treatment of Renal Mass s/p post op right radical nephrectomy Knowledge Deficit Related to post op care needs and follow up and No Hospital Follow Up Provider appointment patient desires to keep current appointment with PCP and follow up with urologist after the holidays.  Goal:  Over the next 30 days, the patient will not experience hospital readmission  Interventions:  Transitions of Care: Doctor Visits  - discussed the importance of doctor visits 08/04/24 Reviewed with patient: The patient was instructed to be ambulatory but told to refrain from heavy lifting, strenuous activity, or driving.  We discussed CKD diet information in her paperwork patient confirms paperwork and has been researching diet information. We reviewed s/s of infection at site and for fever, uncontrolled pain, increased weakness with changes in urinating, s/s of burning, pus, or pain with urination.  Patient Self Care Activities:  Attend all scheduled provider appointments Call pharmacy for medication refills 3-7 days in advance of running out of medications Call provider office for new concerns or questions  Notify RN Care Manager of TOC call rescheduling needs Participate in Transition of Care Program/Attend TOC scheduled calls Perform all self care activities independently  Take medications as prescribed    Plan:  Follow up with provider re: post hospital medication changes or concerns, patient verbalized understanding but wishes to keep her current appointment. Encouraged  patient to call or let this writer assist if needed. The patient has been provided with contact information for the care management team and has been advised to call with any health related questions or concerns.  Discussed and offered 30 day TOC program.  Patient agrees to weekly calls.  The patient has been provided with contact information for the care management team and has been advised to call with any health -related questions or concerns.  The patient verbalized understanding with current plan of care.  The patient is directed to their insurance card regarding availability of benefits coverage.          Patient verbalizes understanding of instructions and care plan provided today and agrees to view in MyChart. Active MyChart status and patient understanding of how to access instructions and care plan via MyChart confirmed with patient.     The patient has been provided with contact information for the care management team and has been advised to call with any health related questions or concerns.   Please call the care guide team at [phone] if you need to cancel or reschedule your appointment.   Please call the [place]  National Suicide Prevention Lifeline: 629 729 2311 or TTY: 725-616-8803 TTY 984-341-2232) to talk to a trained counselor if you are experiencing a Mental Health or Behavioral Health Crisis or need someone to talk to. Richerd Fish, RN, BSN, CCM [facility], [facility] Management Coordinator Direct Dial: [phone]

## 2024-08-09 ENCOUNTER — Telehealth

## 2024-08-23 NOTE — Progress Notes (Signed)
" ° °  08/25/2024 2:20 PM   [name] August 09, 1951 [id number]  Reason for visit: Follow up Right nephrectomy   HPI: 74 y.o. 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  History of prior lower abdominal liposuction No cardiovascular issues, no strokes heart attacks, no blood thinners    Physical Exam: BP 120/70   Pulse 64   Ht 5' 2 (1.575 m)   Wt 149 lb (67.6 kg)   BMI 27.25 kg/m    General: no acute distress, alert/oriented, conversational  HEENT: equal nondilated pupils CV: regular rate Lung: unlabored breathing, regular rate and rhythm  Abd: nondistended, nontender with palpation, no palpable masses  MSK: moving all extremities without issue, normal observed motor function  Laboratory Data: REPORT OF SURGICAL PATHOLOGY   Accession #: [id number] Patient Name: [name], [name] Visit # : [id number]  MRN: [id number] Physician: Alexander Stacey DOB/Age [birth year] (Age: 30) Gender: F Collected Date: 08/02/2024 Received Date: 08/03/2024  FINAL DIAGNOSIS       1. Kidney, radical nephrectomy for tumor, right and proximal ureter :      ANGIOMYOLIPOMA, CLASSIC VARIANT.      TUMOR SIZE: 7.7 CM.      SURGICAL MARGINS OF RESECTION ARE NEGATIVE FOR TUMOR.      SEE NOTE.       Diagnosis Note : The sections show well circumscribed tumor composed of abundant      adipose tissue, thick walled blood vessels and focal smooth muscle. There are      vessel walls with prominent sclerosis. The overall morphologic features are in       keeping with angiomyolipoma. There is no evidence of epithelioid features,      increased mitotic figures or necrosis.   Pertinent Imaging: N/A    Assessment & Plan:    Renal angiomyolipoma Assessment & Plan: S/p Right robotic simple nephrectomy on 08/02/24  - Path = 7.7 cm angiomyolipoma, -sm  Overall recovering well from surgery.  Reviewed surgical pathology which expectedly showed a large benign angiomyolipoma.  -Referral to nephrology for solitary left kidney, long-term care -BMP today -Follow-up in 6-8 months for checkup  Orders: -     Basic metabolic panel with GFR; Future -     Ambulatory referral to Nephrology       Stacey JONELLE Georganne, MD  [facility] [address], [address] [place], [place] [address] [phone] "

## 2024-08-23 NOTE — Assessment & Plan Note (Addendum)
 S/p Right robotic simple nephrectomy on 08/02/24  - Path = 7.7 cm angiomyolipoma, -sm  Overall recovering well from surgery.  Reviewed surgical pathology which expectedly showed a large benign angiomyolipoma.  -Referral to nephrology for solitary left kidney, long-term care -BMP today -Follow-up in 6-8 months for checkup

## 2024-08-25 ENCOUNTER — Other Ambulatory Visit: Payer: Self-pay

## 2024-08-25 ENCOUNTER — Ambulatory Visit: Admitting: Urology

## 2024-08-25 VITALS — BP 120/70 | HR 64 | Ht 62.0 in | Wt 149.0 lb

## 2024-08-25 DIAGNOSIS — D1771 Benign lipomatous neoplasm of kidney: Secondary | ICD-10-CM

## 2024-08-26 LAB — BASIC METABOLIC PANEL WITH GFR
BUN/Creatinine Ratio: 17 (ref 12–28)
BUN: 29 mg/dL — ABNORMAL HIGH (ref 8–27)
CO2: 22 mmol/L (ref 20–29)
Calcium: 10.1 mg/dL (ref 8.7–10.3)
Chloride: 103 mmol/L (ref 96–106)
Creatinine, Ser: 1.73 mg/dL — ABNORMAL HIGH (ref 0.57–1.00)
Glucose: 78 mg/dL (ref 70–99)
Potassium: 4.5 mmol/L (ref 3.5–5.2)
Sodium: 143 mmol/L (ref 134–144)
eGFR: 31 mL/min/1.73 — ABNORMAL LOW

## 2024-10-03 ENCOUNTER — Ambulatory Visit: Admitting: Dermatology

## 2024-11-30 ENCOUNTER — Ambulatory Visit

## 2025-02-22 ENCOUNTER — Ambulatory Visit: Admitting: Urology
